# Patient Record
Sex: Female | Born: 1970 | Race: Black or African American | Hispanic: No | Marital: Single | Smoking: Never smoker
Health system: Southern US, Community
[De-identification: ages and names within clinical notes are randomized; demographics above are authoritative.]

## PROBLEM LIST (undated history)

## (undated) DIAGNOSIS — E119 Type 2 diabetes mellitus without complications: Secondary | ICD-10-CM

## (undated) DIAGNOSIS — Z8679 Personal history of other diseases of the circulatory system: Secondary | ICD-10-CM

## (undated) DIAGNOSIS — E785 Hyperlipidemia, unspecified: Secondary | ICD-10-CM

## (undated) DIAGNOSIS — I499 Cardiac arrhythmia, unspecified: Secondary | ICD-10-CM

## (undated) DIAGNOSIS — G809 Cerebral palsy, unspecified: Secondary | ICD-10-CM

## (undated) DIAGNOSIS — E669 Obesity, unspecified: Secondary | ICD-10-CM

## (undated) DIAGNOSIS — F6381 Intermittent explosive disorder: Secondary | ICD-10-CM

## (undated) DIAGNOSIS — F79 Unspecified intellectual disabilities: Secondary | ICD-10-CM

## (undated) DIAGNOSIS — G40919 Epilepsy, unspecified, intractable, without status epilepticus: Secondary | ICD-10-CM

## (undated) DIAGNOSIS — I10 Essential (primary) hypertension: Secondary | ICD-10-CM

## (undated) HISTORY — DX: Obesity, unspecified: E66.9

## (undated) HISTORY — DX: Type 2 diabetes mellitus without complications: E11.9

## (undated) HISTORY — DX: Essential (primary) hypertension: I10

## (undated) HISTORY — DX: Personal history of other diseases of the circulatory system: Z86.79

## (undated) HISTORY — DX: Hyperlipidemia, unspecified: E78.5

## (undated) HISTORY — DX: Unspecified intellectual disabilities: F79

## (undated) HISTORY — PX: OTHER SURGICAL HISTORY: SHX169

## (undated) HISTORY — DX: Epilepsy, unspecified, intractable, without status epilepticus: G40.919

---

## 2013-06-22 ENCOUNTER — Ambulatory Visit: Payer: Self-pay | Admitting: Nurse Practitioner

## 2013-07-26 ENCOUNTER — Encounter: Payer: Self-pay | Admitting: Nurse Practitioner

## 2013-07-26 DIAGNOSIS — G40802 Other epilepsy, not intractable, without status epilepticus: Secondary | ICD-10-CM

## 2013-07-26 DIAGNOSIS — F79 Unspecified intellectual disabilities: Secondary | ICD-10-CM | POA: Insufficient documentation

## 2013-07-28 ENCOUNTER — Ambulatory Visit: Payer: Self-pay | Admitting: Nurse Practitioner

## 2013-08-30 ENCOUNTER — Encounter: Payer: Self-pay | Admitting: Podiatry

## 2013-08-30 ENCOUNTER — Ambulatory Visit (INDEPENDENT_AMBULATORY_CARE_PROVIDER_SITE_OTHER): Payer: Medicare Other | Admitting: Podiatry

## 2013-08-30 VITALS — BP 91/52 | HR 83 | Resp 12

## 2013-08-30 DIAGNOSIS — B351 Tinea unguium: Secondary | ICD-10-CM

## 2013-08-30 DIAGNOSIS — M79609 Pain in unspecified limb: Secondary | ICD-10-CM

## 2013-08-30 NOTE — Progress Notes (Signed)
Subjective:     Patient ID: Sandra Osborne, female   DOB: Aug 13, 1971, 42 y.o.   MRN: [id number]  HPI patient presents with caregiver stating nails are really bothering her and also her feet in general her with her toes and she is getting need new diabetic shoes   Review of Systems     Objective:   Physical Exam Diminished neurovascular status was noted with diminished muscle strength and DTR reflexes. Nail disease with thickness 1-5 both feet with diminished hair growth noted mild varicosities within the ankle region and corn callus formation     Assessment:     At wrist diabetic with structural deformities and nail disease with pain 1-5 bilateral    Plan:     Discussed diabetic shoes and today debridement nailbeds 1-5 both feet. We will get approval and have been made for her to prevent further problems

## 2013-09-23 ENCOUNTER — Ambulatory Visit (INDEPENDENT_AMBULATORY_CARE_PROVIDER_SITE_OTHER): Payer: Medicare Other | Admitting: Nurse Practitioner

## 2013-09-23 ENCOUNTER — Encounter: Payer: Self-pay | Admitting: Nurse Practitioner

## 2013-09-23 VITALS — BP 132/80 | HR 72 | Ht 62.5 in | Wt 188.0 lb

## 2013-09-23 DIAGNOSIS — F79 Unspecified intellectual disabilities: Secondary | ICD-10-CM

## 2013-09-23 DIAGNOSIS — G40802 Other epilepsy, not intractable, without status epilepticus: Secondary | ICD-10-CM

## 2013-09-23 NOTE — Progress Notes (Signed)
[facility]  PATIENT: Sandra Osborne DOB: [birth year]   REASON FOR VISIT: Followup for seizure disorder   HISTORY OF PRESENT ILLNESS: Sandra Osborne, 42 year old female returns for followup. She is accompanied today by her sister with whom she lives. She has had 2 seizures in 16 months. Her seizures consist of generalized jerking lasting less than 1 minute. She has good appetite and is sleeping well at night. Dr. Elesa Massed her psychiatrist orders her seizure medication. No medical issues have come up since last seen  HISTORY: of mental retardation, and seizures. The patient continues to have seizures with some regularity, and she has had 2 seizures in 2013. The patient's last seizure was one month ago. The patient will have generalized jerking, usually lasting less than 1 minute. The patient is on Keppra and Zonegran. More recently, the sister has noted that the patient will wheeze some in the evening hours. The patient has recently gained some weight. The patient does not wheeze at other times. No other new medical issues have come up since last seen.  REVIEW OF SYSTEMS: Full 14 system review of systems performed and notable only for those listed, all others are neg:  Constitutional: N/A  Cardiovascular: N/A  Ear/Nose/Throat: N/A  Skin: N/A  Eyes: N/A  Respiratory: N/A  Gastroitestinal: N/A  Hematology/Lymphatic: N/A  Endocrine: N/A Musculoskeletal: Walking difficulty Allergy/Immunology: N/A  Neurological: Seizure  Psychiatric: N/A   ALLERGIES: Allergies  Allergen Reactions  . Depakote [Divalproex Sodium]   . Primidone     HOME MEDICATIONS: Outpatient Prescriptions Prior to Visit  Medication Sig Dispense Refill  . calcium carbonate (OS-CAL) 600 MG TABS tablet Take 600 mg by mouth 2 (two) times daily with a meal.      . docusate sodium (COLACE) 100 MG capsule Take 100 mg by mouth daily.      . fexofenadine (ALLEGRA) 180 MG tablet Take 180 mg by mouth daily as  needed.      . levETIRAcetam (KEPPRA) 500 MG tablet Take 500 mg by mouth every 12 (twelve) hours. 2 tablets      . lovastatin (MEVACOR) 20 MG tablet Take 20 mg by mouth at bedtime.      . medroxyPROGESTERone (DEPO-PROVERA) 150 MG/ML injection Inject 150 mg into the muscle every 3 (three) months.      . metoprolol succinate (TOPROL-XL) 50 MG 24 hr tablet Take 50 mg by mouth daily. Take with or immediately following a meal.      . QUEtiapine (SEROQUEL) 200 MG tablet Take 200 mg by mouth 2 (two) times daily.      . quinapril (ACCUPRIL) 10 MG tablet Take 10 mg by mouth at bedtime.      Marland Kitchen zonisamide (ZONEGRAN) 100 MG capsule Take 100 mg by mouth 2 (two) times daily. 3 capsules       No facility-administered medications prior to visit.    PAST MEDICAL HISTORY: Past Medical History  Diagnosis Date  . Intractable seizures   . Mental retardation   . Dyslipidemia   . Diabetes   . Obesity   . Hypertension   . History of heart disease     PAST SURGICAL HISTORY: Past Surgical History  Procedure Laterality Date  . Dental extractions      FAMILY HISTORY: Family History  Problem Relation Age of Onset  . Lung cancer Mother   . Brain cancer Mother     SOCIAL HISTORY: History   Social History  . Marital Status: Single  Spouse Name: N/A    Number of Children: N/A  . Years of Education: N/A   Occupational History  . Not on file.   Social History Main Topics  . Smoking status: Never Smoker   . Smokeless tobacco: Never Used  . Alcohol Use: No  . Drug Use: No  . Sexual Activity: Not on file   Other Topics Concern  . Not on file   Social History Narrative   The patient is single and lives with her sister.   The patient goes to a day program.   Patient is left-handed.   Patient drinks two caffeine drinks daily.              PHYSICAL EXAM  Filed Vitals:   09/23/13 1018  BP: 132/80  Pulse: 72  Height: 5' 2.5" (1.588 m)  Weight: 188 lb (85.276 kg)   Body mass index  is 33.82 kg/(m^2).  Generalized: Well developed, moderately obese in no acute distress  Neurological examination   Mentation: Alert oriented to time, place, history taking. Does not follows all commands speech and language are limited Cranial nerve II-XII: .Pupils were equal round reactive to light extraocular movements were full, blinks to threat Facial sensation and strength were normal. hearing was intact to finger rubbing bilaterally. Uvula tongue midline. head turning and shoulder shrug were normal and symmetric.Tongue protrusion into cheek strength was normal. Motor: normal bulk and tone, full strength in the BUE, BLE,  No focal weakness Coordination:does not cooperate for testing Reflexes: Brachioradialis 2/2, biceps 2/2, triceps 2/2, patellar 2/2, Achilles 1/1, plantar responses were flexor bilaterally. Gait and Station: Rising up from seated position without assistance, wide based stance,  moderate stride, good arm swing, smooth turning, unable to perform tiptoe, and heel walking. Tandem gait not tested.   DIAGNOSTIC DATA (LABS, IMAGING, TESTING) - None to review  ASSESSMENT AND PLAN  42 y.o. year old female  has a past medical history of Intractable seizures; Mental retardation; here for followup. She has had 2 seizures in 16 months since last seen. She is currently on Keppra and Zonegran. She gets her refills from Dr. Elesa Massed.  Continue Zonegran and Keppra at current doses.  F/U yearly and prn Call for increase in seizure activity Nilda Riggs, [facility], [facility], APRN  [facility] [address] [place], [place] [address] [phone]

## 2013-09-23 NOTE — Progress Notes (Signed)
I have read the note, and I agree with the clinical assessment and plan.  WILLIS,CHARLES KEITH   

## 2013-09-23 NOTE — Patient Instructions (Signed)
Continue Zonegran and Keppra at current doses, these are renewed by Dr. Elesa Massed F/U yearly and prn

## 2013-10-10 ENCOUNTER — Telehealth: Payer: Self-pay | Admitting: *Deleted

## 2013-10-10 NOTE — Telephone Encounter (Signed)
CALLED AND L/M REGARDING DIABETIC SHOES. RECEIVED FAX FROM [facility] STATING:" DATE LAST SEEN BY PHYSICIAN NOT COMPLIANT". L/M STATING TO CALL BACK WITH ANY QUESTIONS SINCE DIABETIC SHOES ARE NOT BEING APPROVED.

## 2013-11-14 ENCOUNTER — Encounter: Payer: Self-pay | Admitting: *Deleted

## 2013-11-14 NOTE — Progress Notes (Signed)
SENT PT POST CARD LETTING HER KNOW DIABETIC SHOES ARE HERE. 

## 2013-11-22 ENCOUNTER — Ambulatory Visit: Payer: Medicare Other | Admitting: Podiatry

## 2013-11-29 ENCOUNTER — Encounter: Payer: Self-pay | Admitting: Podiatry

## 2013-11-29 ENCOUNTER — Ambulatory Visit (INDEPENDENT_AMBULATORY_CARE_PROVIDER_SITE_OTHER): Payer: Medicare Other | Admitting: Podiatry

## 2013-11-29 VITALS — BP 122/78 | HR 64 | Resp 12

## 2013-11-29 DIAGNOSIS — M79609 Pain in unspecified limb: Secondary | ICD-10-CM

## 2013-11-29 DIAGNOSIS — E1159 Type 2 diabetes mellitus with other circulatory complications: Secondary | ICD-10-CM

## 2013-11-29 DIAGNOSIS — M2010 Hallux valgus (acquired), unspecified foot: Secondary | ICD-10-CM

## 2013-11-29 DIAGNOSIS — B351 Tinea unguium: Secondary | ICD-10-CM

## 2013-11-29 NOTE — Progress Notes (Signed)
Subjective:     Patient ID: Sandra HandyPatricia A Osborne, female   DOB: 04/06/1971, 43 y.o.   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

## 2014-02-28 ENCOUNTER — Ambulatory Visit (INDEPENDENT_AMBULATORY_CARE_PROVIDER_SITE_OTHER): Payer: Medicare Other | Admitting: Podiatry

## 2014-02-28 VITALS — BP 110/72 | HR 74 | Resp 16

## 2014-02-28 DIAGNOSIS — M79609 Pain in unspecified limb: Secondary | ICD-10-CM

## 2014-02-28 DIAGNOSIS — B351 Tinea unguium: Secondary | ICD-10-CM

## 2014-03-01 NOTE — Progress Notes (Signed)
Subjective:     Patient ID: Sandra Osborne, female   DOB: 03-15-1971, 43 y.o.   MRN: [id number]  HPI patient presents with painful nailbeds that are thick yellow and brittle 1-5 both feet  she cannot cut   Review of Systems     Objective:   Physical Exam Neurovascular status intact no change in health history with thick nailbeds and are brittle and yellow 1-5 both feet and painful    Assessment:     Mycotic nail infection with pain 1-5 both feet    Plan:     Debridement painful nailbeds 1-5 both feet with no iatrogenic bleeding noted

## 2014-04-28 ENCOUNTER — Encounter: Payer: Self-pay | Admitting: Neurology

## 2014-05-30 ENCOUNTER — Ambulatory Visit: Payer: Medicare Other

## 2014-06-23 ENCOUNTER — Ambulatory Visit (INDEPENDENT_AMBULATORY_CARE_PROVIDER_SITE_OTHER): Payer: Medicare Other | Admitting: Podiatry

## 2014-06-23 DIAGNOSIS — B351 Tinea unguium: Secondary | ICD-10-CM

## 2014-06-23 DIAGNOSIS — M79676 Pain in unspecified toe(s): Secondary | ICD-10-CM

## 2014-06-23 DIAGNOSIS — M79609 Pain in unspecified limb: Secondary | ICD-10-CM

## 2014-06-23 NOTE — Progress Notes (Signed)
Subjective:     Patient ID: Sandra Osborne, female   DOB: 08-05-1971, 43 y.o.   MRN: [id number]  HPI patient presents with nails that are thick and painful 1-5 both feet that she cannot take care of   Review of Systems     Objective:   Physical Exam Neurovascular status intact with thick yellow brittle nailbeds 1-5 both feet    Assessment:     Mycotic nail infection with pain 1-5 both feet    Plan:     Debris painful nailbeds 1-5 both feet with no iatrogenic bleeding noted

## 2014-09-15 ENCOUNTER — Ambulatory Visit: Payer: Medicare Other | Admitting: Neurology

## 2014-09-28 ENCOUNTER — Telehealth: Payer: Self-pay | Admitting: Neurology

## 2014-09-28 ENCOUNTER — Ambulatory Visit: Payer: Medicare Other | Admitting: Neurology

## 2014-09-28 NOTE — Telephone Encounter (Signed)
This patient did not show for a revisit appointment today. 

## 2014-10-06 ENCOUNTER — Ambulatory Visit (INDEPENDENT_AMBULATORY_CARE_PROVIDER_SITE_OTHER): Payer: Medicare Other | Admitting: Podiatry

## 2014-10-06 DIAGNOSIS — M79676 Pain in unspecified toe(s): Secondary | ICD-10-CM | POA: Diagnosis not present

## 2014-10-06 DIAGNOSIS — B351 Tinea unguium: Secondary | ICD-10-CM

## 2014-10-06 NOTE — Progress Notes (Signed)
Subjective:     Patient ID: Sandra Osborne, female   DOB: 12/26/1970, 44 y.o.   MRN: [id number]  HPI patient presents with nails that are thick and painful 1-5 both feet that she cannot take care of   Review of Systems     Objective:   Physical Exam Neurovascular status intact with thick yellow brittle nailbeds 1-5 both feet    Assessment:     Mycotic nail infection with pain 1-5 both feet    Plan:     Debris painful nailbeds 1-5 both feet with no iatrogenic bleeding noted

## 2014-11-06 ENCOUNTER — Ambulatory Visit: Payer: Medicare Other | Admitting: Podiatry

## 2014-11-07 ENCOUNTER — Ambulatory Visit (INDEPENDENT_AMBULATORY_CARE_PROVIDER_SITE_OTHER): Payer: Medicare Other

## 2014-11-07 ENCOUNTER — Ambulatory Visit (INDEPENDENT_AMBULATORY_CARE_PROVIDER_SITE_OTHER): Payer: Medicare Other | Admitting: Podiatry

## 2014-11-07 ENCOUNTER — Encounter: Payer: Self-pay | Admitting: Podiatry

## 2014-11-07 VITALS — BP 94/82 | HR 76 | Resp 16

## 2014-11-07 DIAGNOSIS — M2011 Hallux valgus (acquired), right foot: Secondary | ICD-10-CM

## 2014-11-07 DIAGNOSIS — M779 Enthesopathy, unspecified: Secondary | ICD-10-CM | POA: Diagnosis not present

## 2014-11-07 NOTE — Progress Notes (Signed)
Subjective:     Patient ID: Sandra HandyPatricia A Monarrez, female   DOB: 12/15/1970, 44 y.o.   MRN: [id number]  HPI patient presents with caregiver who states that she traumatized her right foot and she's been complaining of it and that are not sure as to whether or not she broke something   Review of Systems     Objective:   Physical Exam Neurovascular status intact with no health history changes noted and large structural deformity first metatarsal right medial side with redness noted and pain when pressed    Assessment:     Probable trauma to the right first MPJ right with redness noted    Plan:     Precautionary x-ray taken at this time and advised on soaks therapy anti-inflammatories. Patient be seen back if symptoms persist and reviewed x-ray today

## 2014-12-09 ENCOUNTER — Emergency Department: Payer: Self-pay | Admitting: Student

## 2014-12-11 ENCOUNTER — Telehealth: Payer: Self-pay | Admitting: Neurology

## 2014-12-11 NOTE — Telephone Encounter (Signed)
[name] is calling as patient had a really bad seizure and she went to ER at [facility] and they stated she needs to see her Neurologist immediately.  Please call [name].

## 2014-12-11 NOTE — Telephone Encounter (Signed)
Patient was last seen 08-2013, and then was a no show for 08-2014.  According to last note patient was getting seizure meds from psychiatrist, Dr. Elesa MassedWard.

## 2014-12-11 NOTE — Telephone Encounter (Signed)
I called the family member. The patient recently had a bad seizure. She was changed to generic Zonegran in the summer of 2015, a recent change to generic with the Keppra as well. The patient will need a work in revisit sometime in the next several weeks.

## 2014-12-12 NOTE — Telephone Encounter (Signed)
Left message for Bari MantisRegina Oakley to return call and schedule patient for an appointment to see Darrol Angelarolyn Martin, in the next week or two, per Dr. Anne HahnWillis.

## 2014-12-13 NOTE — Telephone Encounter (Signed)
Patient is scheduled for 12-15-14 with Darrol Angelarolyn Martin, NP.

## 2014-12-15 ENCOUNTER — Ambulatory Visit (INDEPENDENT_AMBULATORY_CARE_PROVIDER_SITE_OTHER): Payer: Medicare Other | Admitting: Nurse Practitioner

## 2014-12-15 ENCOUNTER — Encounter: Payer: Self-pay | Admitting: Nurse Practitioner

## 2014-12-15 VITALS — Ht 64.0 in | Wt 190.2 lb

## 2014-12-15 DIAGNOSIS — F79 Unspecified intellectual disabilities: Secondary | ICD-10-CM | POA: Diagnosis not present

## 2014-12-15 DIAGNOSIS — R269 Unspecified abnormalities of gait and mobility: Secondary | ICD-10-CM

## 2014-12-15 DIAGNOSIS — G40309 Generalized idiopathic epilepsy and epileptic syndromes, not intractable, without status epilepticus: Secondary | ICD-10-CM | POA: Diagnosis not present

## 2014-12-15 MED ORDER — ZONISAMIDE 100 MG PO CAPS: 300.0000 mg | ORAL_CAPSULE | Freq: Two times a day (BID) | ORAL | Status: DC

## 2014-12-15 MED ORDER — LEVETIRACETAM 500 MG PO TABS: ORAL_TABLET | ORAL | Status: DC

## 2014-12-15 NOTE — Progress Notes (Signed)
[facility]  PATIENT: Sandra Osborne DOB: 1970-10-04   REASON FOR VISIT:follow up for seizure disorder, gait disorder, MR HISTORY FROM: sister    HISTORY OF PRESENT ILLNESS:Sandra Osborne, 44 year old female returns for followup. She is accompanied today by her sister with whom she lives. She was last seen in this office 09/23/2013 She had a seizure last Saturday, December 09, 2014  and was taken to the emergency room to [facility] where according to the sister she was given an extra dose of Keppra by mouth. Her medication was not changed . The sister knows of 2 seizures she has had in the last 6 months however she did not bring a record to the office and does not want to guess  Her seizures consist of generalized jerking lasting less than 1 to 2 minutes. On further questioning she has had a switch in her seizure medications to generic last summer and she does not always get them every 12 hours. She has good appetite and is sleeping well at night. 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 She goes to a day program [facility]. Her primary care physician is Dr. Letta Pate. She has a history of high blood pressure and mental retardation. She is uncooperative for most of the physical exam today. She needs medication refills for her seizure medications   HISTORY: of mental retardation, and seizures. The patient continues to have seizures with some regularity, and she has had 2 seizures in 2013. The patient's last seizure was one month ago. The patient will have generalized jerking, usually lasting less than 1 minute. The patient is on Keppra and Zonegran. More recently, the sister has noted that the patient will wheeze some in the evening hours. The patient has recently gained some weight. The patient does not wheeze at other times. No other new medical issues have come up since last seen.    REVIEW OF SYSTEMS: Full 14 system review of systems performed and notable only  for those listed, all others are neg:  Constitutional: neg  Cardiovascular: neg Ear/Nose/Throat: neg  Skin: neg Eyes: Allergies Respiratory: Wheezing  Gastroitestinal: neg  Hematology/Lymphatic: neg  Endocrine: neg Musculoskeletal: Gait abnormality  Allergy/Immunology: neg Neurological: Seizure disorder, mental retardation Psychiatric: Behavior disorder Sleep ALLERGIES: Allergies  Allergen Reactions  . Depakote [Divalproex Sodium]   . Primidone     HOME MEDICATIONS: Outpatient Prescriptions Prior to Visit  Medication Sig Dispense Refill  . docusate sodium (COLACE) 100 MG capsule Take 100 mg by mouth daily.    . fexofenadine (ALLEGRA) 180 MG tablet Take 180 mg by mouth daily as needed.    . lovastatin (MEVACOR) 20 MG tablet Take 20 mg by mouth at bedtime.    . medroxyPROGESTERone (DEPO-PROVERA) 150 MG/ML injection Inject 150 mg into the muscle every 3 (three) months.    . metoprolol (LOPRESSOR) 50 MG tablet     . metoprolol succinate (TOPROL-XL) 50 MG 24 hr tablet Take 50 mg by mouth daily. Take with or immediately following a meal.    . QUEtiapine (SEROQUEL) 200 MG tablet Take 200 mg by mouth 2 (two) times daily.    . quinapril (ACCUPRIL) 10 MG tablet Take 10 mg by mouth at bedtime.    . levETIRAcetam (KEPPRA) 500 MG tablet Take 500 mg by mouth every 12 (twelve) hours. 3 tablets daily    . zonisamide (ZONEGRAN) 100 MG capsule Take 300 mg by mouth 2 (two) times daily.     . calcium carbonate (OS-CAL)  600 MG TABS tablet Take 600 mg by mouth 2 (two) times daily with a meal.     No facility-administered medications prior to visit.    PAST MEDICAL HISTORY: Past Medical History  Diagnosis Date  . Intractable seizures   . Mental retardation   . Dyslipidemia   . Diabetes   . Obesity   . Hypertension   . History of heart disease     PAST SURGICAL HISTORY: Past Surgical History  Procedure Laterality Date  . Dental extractions      FAMILY HISTORY: Family History    Problem Relation Age of Onset  . Lung cancer Mother   . Brain cancer Mother     SOCIAL HISTORY: History   Social History  . Marital Status: Single    Spouse Name: N/A  . Number of Children: N/A  . Years of Education: N/A   Occupational History  . Not on file.   Social History Main Topics  . Smoking status: Never Smoker   . Smokeless tobacco: Never Used  . Alcohol Use: No  . Drug Use: No  . Sexual Activity: Not on file   Other Topics Concern  . Not on file   Social History Narrative   The patient is single and lives with her sister.   The patient goes to a day program.   Patient is left-handed.   Patient drinks two caffeine drinks daily.              PHYSICAL EXAM  Filed Vitals:   12/15/14 0815  Height: 5\' 4"  (1.626 m)  Weight: 190 lb 3.2 oz (86.274 kg)   Body mass index is 32.63 kg/(m^2).  Generalized: Well developed, moderately obese female in no acute distress  Head: normocephalic and atraumatic,. Oropharynx benign  Neck: Supple,   Musculoskeletal: No deformity   Neurological examination   Mentation: Alert ,MR , does not follow all commands, speech and language are limited.    Cranial nerve II-XII: Pupils were equal round reactive to light extraocular movements were full, blinks to threat  Facial sensation and strength were normal. hearing was intact to finger rubbing bilaterally. Uvula tongue midline. head turning and shoulder shrug were normal and symmetric.Tongue protrusion into cheek strength was normal. Motor: normal bulk and tone, full strength in the BUE, BLE, fine finger movements normal, no pronator drift. No focal weakness Sensory: Withdraws to pain Coordination: Does not cooperate for testing Reflexes: Brachioradialis 2/2, biceps 2/2, triceps 2/2, patellar 2/2, Achilles 1/1, plantar responses were flexor bilaterally. Gait and Station: Rising up from seated position without assistance, wide based stance,  moderate stride, good arm swing,  smooth turning, un able to perform tiptoe, and heel walking. Tandem gait not tested.  DIAGNOSTIC DATA (LABS, IMAGING, TESTING) - ASSESSMENT AND PLAN  44 y.o. year old female  has a past medical history of Intractable seizures; Mental retardation; gait abnormality and  behavior disorder here to follow-up. Recent seizure on March 12 with a trip to the ER. Given an extra dose of Keppra by mouth. No further seizure activity since that time Sister reports she does not always get her seizure medication on time   Will increase Keppra to 1000mg  in the morning and keep 500mg  at night Continue Zonegran 300mg  twice daily Keep a record of seizure activity and bring to visits FU yearly and prn next visit with Dr. Woody SellerWillis Lynton Crescenzo Carolyn Kazuto Sevey, [facility], [facility], APRN  [facility] [address], [address] [place], [place] [id number] [phone]

## 2014-12-15 NOTE — Patient Instructions (Addendum)
Will increase Keppra to 1000mg  in the morning and keep 500mg  at night Continue Zonegran 300mg  twice daily Keep a record of seizure activity and bring to visits FU yearly and prn next visit with Dr. Anne HahnWillis

## 2014-12-15 NOTE — Progress Notes (Signed)
I have read the note, and I agree with the clinical assessment and plan.  WILLIS,CHARLES KEITH   

## 2014-12-29 ENCOUNTER — Ambulatory Visit: Payer: Medicare Other | Admitting: Nurse Practitioner

## 2015-01-05 ENCOUNTER — Ambulatory Visit (INDEPENDENT_AMBULATORY_CARE_PROVIDER_SITE_OTHER): Payer: Medicare Other | Admitting: Podiatry

## 2015-01-05 ENCOUNTER — Ambulatory Visit: Payer: Medicare Other

## 2015-01-05 DIAGNOSIS — E1151 Type 2 diabetes mellitus with diabetic peripheral angiopathy without gangrene: Secondary | ICD-10-CM

## 2015-01-05 DIAGNOSIS — M79676 Pain in unspecified toe(s): Secondary | ICD-10-CM

## 2015-01-05 DIAGNOSIS — B351 Tinea unguium: Secondary | ICD-10-CM | POA: Diagnosis not present

## 2015-01-05 DIAGNOSIS — M2011 Hallux valgus (acquired), right foot: Secondary | ICD-10-CM

## 2015-01-05 NOTE — Progress Notes (Signed)
Subjective:     Patient ID: Sandra HandyPatricia A Harms, female   DOB: 08/29/1971, 44 y.o.   MRN: [id number]  HPI patient presents with nails that are thick and painful 1-5 both feet that she cannot take care of   Review of Systems     Objective:   Physical Exam Neurovascular status intact with thick yellow brittle nailbeds 1-5 both feet    Assessment:     Mycotic nail infection with pain 1-5 both feet    Plan:     Debris painful nailbeds 1-5 both feet with no iatrogenic bleeding noted

## 2015-01-07 NOTE — Progress Notes (Signed)
Subjective:     Patient ID: Sandra HandyPatricia A Galvao, female   DOB: 03/14/1971, 44 y.o.   MRN: [id number]  HPI patient presents with caregiver with severe nail disease 1-5 both feet structural imbalances of both feet and long-term diabetes   Review of Systems     Objective:   Physical Exam Neurovascular status diminished but intact with structural HAV deformity bilateral with redness and thick nailbeds 1-5 both feet that are painful brittle when pressed    Assessment:     Mycotic nail infections with pain 1-5 both feet and structural imbalances with at risk diabetic with diminished neuro vascular status    Plan:     Reviewed with caregiver condition and her diabetic shoes need to be redone as they are starting to wear out from last year. I went ahead today debrided nailbeds 1-5 both feet with no iatrogenic bleeding noted and we will get approval for diabetic shoes due to her at risk condition and structural imbalance

## 2015-02-13 ENCOUNTER — Ambulatory Visit: Payer: Medicare Other | Admitting: Neurology

## 2015-02-20 ENCOUNTER — Telehealth: Payer: Self-pay | Admitting: Neurology

## 2015-02-20 NOTE — Telephone Encounter (Signed)
I called back, got no answer.  Left message we were returning call.

## 2015-02-20 NOTE — Telephone Encounter (Signed)
Sandra LandAngela Ksor with [employer] called regarding directions on levETIRAcetam (KEPPRA) 500 MG tablet.  Please call and advise.  She can be reached at [phone].

## 2015-02-21 NOTE — Telephone Encounter (Signed)
Sandra Osborne with [employer] returned your call. She gave a different phone number. Please call @ [phone].

## 2015-02-21 NOTE — Telephone Encounter (Signed)
I called again.  Got no answer.  Left message.  

## 2015-03-08 ENCOUNTER — Ambulatory Visit: Payer: Medicare Other

## 2015-04-06 ENCOUNTER — Ambulatory Visit: Payer: Medicare Other

## 2015-04-10 ENCOUNTER — Ambulatory Visit (INDEPENDENT_AMBULATORY_CARE_PROVIDER_SITE_OTHER): Payer: Medicare Other | Admitting: Podiatry

## 2015-04-10 DIAGNOSIS — M79676 Pain in unspecified toe(s): Secondary | ICD-10-CM

## 2015-04-10 DIAGNOSIS — B351 Tinea unguium: Secondary | ICD-10-CM

## 2015-04-10 NOTE — Progress Notes (Signed)
Subjective: 44 y.o. female returns the office today for painful, elongated, thickened toenails which she is unable to trim herself. Denies any redness or drainage around the nails. Denies any acute changes since last appointment and no new complaints today. Denies any systemic complaints such as fevers, chills, nausea, vomiting.   Objective: Awake, alert, NAD; presents with a caregiver DP/PT pulses decreased, CRT less than 3 seconds Protective sensation decreased with SWMF. Nails hypertrophic, dystrophic, elongated, brittle, discolored 10. There is tenderness overlying the nails 1-5 bilaterally. There is no surrounding erythema or drainage along the nail sites. No open lesions or pre-ulcerative lesions are identified. HAV present.  No other areas of tenderness bilateral lower extremities. No overlying edema, erythema, increased warmth. No pain with calf compression, swelling, warmth, erythema.  Assessment: Patient presents with symptomatic onychomycosis  Plan: -Treatment options including alternatives, risks, complications were discussed -Nails sharply debrided 10 without complication/bleeding. -At today's appointment she was measured for diabetic shoes.  -Discussed daily foot inspection. If there are any changes, to call the office immediately.  -Follow-up in 3 months or sooner if any problems are to arise. In the meantime, encouraged to call the office with any questions, concerns, changes symptoms.   Ovid CurdMatthew Wagoner, DPM

## 2015-05-08 ENCOUNTER — Ambulatory Visit: Payer: Self-pay | Admitting: Podiatry

## 2015-05-17 ENCOUNTER — Ambulatory Visit (INDEPENDENT_AMBULATORY_CARE_PROVIDER_SITE_OTHER): Payer: Medicare Other | Admitting: Podiatry

## 2015-05-17 ENCOUNTER — Encounter: Payer: Self-pay | Admitting: Podiatry

## 2015-05-17 DIAGNOSIS — E1151 Type 2 diabetes mellitus with diabetic peripheral angiopathy without gangrene: Secondary | ICD-10-CM

## 2015-05-17 DIAGNOSIS — E1059 Type 1 diabetes mellitus with other circulatory complications: Secondary | ICD-10-CM | POA: Diagnosis not present

## 2015-05-17 DIAGNOSIS — M2012 Hallux valgus (acquired), left foot: Secondary | ICD-10-CM | POA: Diagnosis not present

## 2015-05-17 DIAGNOSIS — M2011 Hallux valgus (acquired), right foot: Secondary | ICD-10-CM

## 2015-05-17 DIAGNOSIS — E1342 Other specified diabetes mellitus with diabetic polyneuropathy: Secondary | ICD-10-CM

## 2015-05-17 NOTE — Progress Notes (Signed)
Patient ID: ONDINE GEMME, female   DOB: 11/15/1970, 44 y.o.   MRN: [id number]  Patient presents today to pick up diabetic shoes. Shoes were dispensed. She tried the shoes on and they appear to be fitting well without any high pressure areas. Oral and written break in instructions were given. If there are any problems at all with the shoes to call the office. She had no new complaints today. Follow-up as scheduled, or sooner should any problems arise. Encouraged to call the office with any questions/concerns/change in symptoms.

## 2015-05-17 NOTE — Patient Instructions (Signed)

## 2015-07-02 ENCOUNTER — Other Ambulatory Visit: Payer: Self-pay | Admitting: Family Medicine

## 2015-07-02 DIAGNOSIS — Z1231 Encounter for screening mammogram for malignant neoplasm of breast: Secondary | ICD-10-CM

## 2015-07-10 ENCOUNTER — Ambulatory Visit (INDEPENDENT_AMBULATORY_CARE_PROVIDER_SITE_OTHER): Payer: Medicare Other | Admitting: Sports Medicine

## 2015-07-10 ENCOUNTER — Ambulatory Visit: Payer: Medicare Other

## 2015-07-10 ENCOUNTER — Encounter: Payer: Self-pay | Admitting: Sports Medicine

## 2015-07-10 DIAGNOSIS — B351 Tinea unguium: Secondary | ICD-10-CM

## 2015-07-10 DIAGNOSIS — M79676 Pain in unspecified toe(s): Secondary | ICD-10-CM

## 2015-07-10 DIAGNOSIS — E1151 Type 2 diabetes mellitus with diabetic peripheral angiopathy without gangrene: Secondary | ICD-10-CM

## 2015-07-10 DIAGNOSIS — M2010 Hallux valgus (acquired), unspecified foot: Secondary | ICD-10-CM

## 2015-07-10 NOTE — Progress Notes (Signed)
Patient ID: RANETTE LUCKADOO, female   DOB: October 20, 1970, 44 y.o.   MRN: [id number] Subjective: LATONIA CONROW is a 44 y.o. female patient with history of type 2 diabetes who presents to office today with caregiver complaining of long, painful nails  while ambulating in shoes, unable to trim herself. Patient states that the glucose reading this morning was recorded by sister but value is unknown. Patient states that her new shoes fit good; caregiver denies any problems or concerns with her shoes. Patient denies any new changes in medication or new problems. Patient denies any new cramping, numbness, burning or tingling in the legs.  Patient Active Problem List   Diagnosis Date Noted  . Seizure disorder, 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  . Abnormality of gait 12/15/2014  . Mental retardation 07/26/2013  . 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 calcium carbonate (OS-CAL) 600 MG TABS tablet Take 600 mg by mouth 2 (two) times daily with a meal.    . cetirizine (ZYRTEC) 10 MG tablet Take 10 mg by mouth daily.    Marland Kitchen docusate sodium (COLACE) 100 MG capsule Take 100 mg by mouth daily.    . fexofenadine (ALLEGRA) 180 MG tablet Take 180 mg by mouth daily as needed.    . levETIRAcetam (KEPPRA) 500 MG tablet 3 tablets daily 2 in the am 1 in the pm 90 tablet 11  . lovastatin (MEVACOR) 20 MG tablet Take 20 mg by mouth at bedtime.    . medroxyPROGESTERone (DEPO-PROVERA) 150 MG/ML injection Inject 150 mg into the muscle every 3 (three) months.    . metoprolol (LOPRESSOR) 50 MG tablet     . metoprolol succinate (TOPROL-XL) 50 MG 24 hr tablet Take 50 mg by mouth daily. Take with or immediately following a meal.    . QUEtiapine (SEROQUEL) 200 MG tablet Take 200 mg by mouth 2 (two) times daily.    . quinapril (ACCUPRIL) 10 MG tablet Take 10 mg by mouth at bedtime.     Marland Kitchen zonisamide (ZONEGRAN) 100 MG capsule Take 3 capsules (300 mg total) by mouth 2 (two) times daily. 180 capsule 11   No current facility-administered medications on file prior to visit.   Allergies  Allergen Reactions  . Depakote [Divalproex Sodium]   . Primidone     Labs:HEMOGLOBIN A1C-  No recent lab on file  Objective: General: Patient is awake, alert, and oriented x 2 and in no acute distress. Steppage/equine gait.  Integument: Skin is warm, dry and supple bilateral. Nails are tender, long, thickened and  dystrophic with subungual debris, consistent with onychomycosis, 1-5 bilateral. No signs of infection. No open lesions or preulcerative lesions present bilateral. Remaining integument unremarkable.  Vasculature:  Dorsalis Pedis pulse 1/4 bilateral. Posterior Tibial pulse  0/4 bilateral.  Capillary fill time <3 sec 1-5 bilateral. Scant hair growth to the level of the digits. Temperature gradient within normal limits. No varicosities present bilateral. No edema present bilateral.   Neurology: Gross sensation present via light touch bilateral.   Musculoskeletal: Bilateral bunion right>left deformities noted bilateral. Muscular strength within normal limits in all lower extremity muscular groups bilateral without pain or limitation on range of motion . No tenderness with calf compression bilateral.  Assessment and Plan: Problem List Items Addressed This Visit    None    Visit Diagnoses    Dermatophytosis of nail    -  Primary    Pain of toe, unspecified laterality        HAV (hallux abducto valgus), unspecified laterality        Diabetic peripheral vascular disorder (HCC)          -Examined patient. -Discussed and educated patient on diabetic foot care, especially with  regards to the vascular, neurological and musculoskeletal systems.  -Stressed the importance of good glycemic control and the detriment of not  controlling glucose levels in relation to the  foot. -Mechanically debrided all nails 1-5 bilateral using sterile nail nipper and filed with dremel without incident  -Continue with diabetic shoes and plastizote inserts daily to accommodate foot type/HAV deformity  -Answered all patient questions -Patient to return as needed or in 3 months for diabetic foot care -Patient advised to call the office if any problems or questions arise in the  Meantime.  Asencion Islam, DPM

## 2015-07-12 ENCOUNTER — Ambulatory Visit: Payer: Self-pay | Attending: Family Medicine

## 2015-10-09 ENCOUNTER — Ambulatory Visit (INDEPENDENT_AMBULATORY_CARE_PROVIDER_SITE_OTHER): Payer: Medicare Other | Admitting: Sports Medicine

## 2015-10-09 ENCOUNTER — Encounter: Payer: Self-pay | Admitting: Sports Medicine

## 2015-10-09 DIAGNOSIS — E1151 Type 2 diabetes mellitus with diabetic peripheral angiopathy without gangrene: Secondary | ICD-10-CM | POA: Diagnosis not present

## 2015-10-09 DIAGNOSIS — B351 Tinea unguium: Secondary | ICD-10-CM | POA: Diagnosis not present

## 2015-10-09 DIAGNOSIS — M79676 Pain in unspecified toe(s): Secondary | ICD-10-CM

## 2015-10-09 DIAGNOSIS — M2010 Hallux valgus (acquired), unspecified foot: Secondary | ICD-10-CM | POA: Diagnosis not present

## 2015-10-09 NOTE — Progress Notes (Signed)
Patient ID: ADISA VIGEANT, female   DOB: Jan 31, 1971, 45 y.o.   MRN: [id number]  Subjective: SHIRAH ROSEMAN is a 45 y.o. female patient with history of type 2 diabetes who presents to office today with caregiver complaining of long, painful nails  while ambulating in shoes, unable to trim herself. Patient states that the glucose reading this morning is unknown but is "good". Patient keeps asking about new shoes during visit; Caregiver states that everytime she comes to office she thinks that she is suppose to get shoes. Current shoes fit well with no issues. Patient denies any new changes in medication or new problems. Patient denies any new cramping, numbness, burning or tingling in the legs.  Patient Active Problem List   Diagnosis Date Noted  . Seizure disorder, 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  . Abnormality of gait 12/15/2014  . Mental retardation 07/26/2013  . 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 calcium carbonate (OS-CAL) 600 MG TABS tablet Take 600 mg by mouth 2 (two) times daily with a meal.    . cetirizine (ZYRTEC) 10 MG tablet Take 10 mg by mouth daily.    Marland Kitchen docusate sodium (COLACE) 100 MG capsule Take 100 mg by mouth daily.    . fexofenadine (ALLEGRA) 180 MG tablet Take 180 mg by mouth daily as needed.    . levETIRAcetam (KEPPRA) 500 MG tablet 3 tablets daily 2 in the am 1 in the pm 90 tablet 11  . lovastatin (MEVACOR) 20 MG tablet Take 20 mg by mouth at bedtime.    . medroxyPROGESTERone (DEPO-PROVERA) 150 MG/ML injection Inject 150 mg into the muscle every 3 (three) months.    . metoprolol (LOPRESSOR) 50 MG tablet     . metoprolol succinate (TOPROL-XL) 50 MG 24 hr tablet Take 50 mg by mouth daily. Take with or immediately following a meal.    . QUEtiapine (SEROQUEL) 200 MG tablet Take 200 mg by mouth 2 (two) times daily.     . quinapril (ACCUPRIL) 10 MG tablet Take 10 mg by mouth at bedtime.    Marland Kitchen zonisamide (ZONEGRAN) 100 MG capsule Take 3 capsules (300 mg total) by mouth 2 (two) times daily. 180 capsule 11   No current facility-administered medications on file prior to visit.   Allergies  Allergen Reactions  . Depakote [Divalproex Sodium]   . Primidone     Labs:HEMOGLOBIN A1C-  No recent lab on file  Objective: General: Patient is awake, alert, and oriented x 2 and in no acute distress. Steppage/equine gait.  Integument: Skin is warm, dry and supple bilateral. Nails are tender, long, thickened and  dystrophic with subungual debris, consistent with onychomycosis, 1-5 bilateral. No signs of infection. No open lesions or preulcerative lesions present bilateral. Remaining integument unremarkable.  Vasculature:  Dorsalis Pedis pulse 1/4 bilateral. Posterior Tibial pulse  0/4 bilateral.  Capillary fill time <3 sec 1-5 bilateral. Scant hair growth to the level of the digits. Temperature gradient within normal limits. No varicosities present bilateral. No edema present bilateral.   Neurology: Gross sensation present via light touch bilateral.   Musculoskeletal:Asymptomatic Bilateral bunion right>left deformities noted bilateral. Muscular strength within normal limits in all lower extremity muscular groups bilateral without pain or limitation on range of motion . No tenderness with calf compression bilateral.  Assessment and Plan: Problem List Items Addressed This Visit    None  Visit Diagnoses    Dermatophytosis of nail    -  Primary    Pain of toe, unspecified laterality        HAV (hallux abducto valgus), unspecified laterality        Diabetic peripheral vascular disorder (HCC)          -Examined patient. -Discussed and educated patient on diabetic foot care, especially with  regards to the vascular, neurological and musculoskeletal systems.  -Stressed the importance of good glycemic control and  the detriment of not  controlling glucose levels in relation to the foot. -Mechanically debrided all nails 1-5 bilateral using sterile nail nipper and filed with dremel without incident  -Continue with diabetic shoes and plastizote inserts daily to accommodate foot type/HAV deformity;Informed patient that she will be elligible for another pair in later spring or summer.  -Answered all patient questions -Patient to return in 3 months for diabetic foot care -Patient's caregiver advised to call the office if any problems or questions arise in the meantime.  Asencion Islamitorya Shamarra Warda, DPM

## 2015-11-08 ENCOUNTER — Other Ambulatory Visit: Payer: Self-pay | Admitting: *Deleted

## 2015-11-08 MED ORDER — LEVETIRACETAM 500 MG PO TABS: ORAL_TABLET | ORAL | Status: DC

## 2015-12-17 ENCOUNTER — Ambulatory Visit: Payer: Medicare Other | Admitting: Neurology

## 2015-12-20 ENCOUNTER — Encounter: Payer: Self-pay | Admitting: Neurology

## 2016-01-01 ENCOUNTER — Encounter: Payer: Self-pay | Admitting: Nurse Practitioner

## 2016-01-01 ENCOUNTER — Ambulatory Visit (INDEPENDENT_AMBULATORY_CARE_PROVIDER_SITE_OTHER): Payer: Medicare Other | Admitting: Nurse Practitioner

## 2016-01-01 VITALS — BP 133/80 | HR 67 | Ht 64.0 in | Wt 185.6 lb

## 2016-01-01 DIAGNOSIS — R269 Unspecified abnormalities of gait and mobility: Secondary | ICD-10-CM

## 2016-01-01 DIAGNOSIS — G40309 Generalized idiopathic epilepsy and epileptic syndromes, not intractable, without status epilepticus: Secondary | ICD-10-CM | POA: Diagnosis not present

## 2016-01-01 DIAGNOSIS — F79 Unspecified intellectual disabilities: Secondary | ICD-10-CM | POA: Diagnosis not present

## 2016-01-01 MED ORDER — LEVETIRACETAM 500 MG PO TABS: ORAL_TABLET | ORAL | Status: DC

## 2016-01-01 NOTE — Progress Notes (Signed)
[facility]  PATIENT: Sandra Osborne DOB: 06/20/1971   REASON FOR VISIT: Follow-up for generalized seizure disorder, abnormality of gait, mental retardation HISTORY FROM: Sandra Osborne    HISTORY OF PRESENT ILLNESS:Ms. Sandra Osborne, 45 year old female returns for followup. She is accompanied today by her Sandra Sandra Osborne with whom she lives. She was last seen in this office 12/15/14.  The Sandra reports she continues to have about 2 seizures per month. Her Keppra was increased at her last visit . Her seizures consist of generalized jerking lasting less than 1 to 2 minutes. She did not call the office . She has good appetite and is sleeping well at night. 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 She goes to a day program [facility]. Her primary care physician is Sandra Osborne. She has a history of high blood pressure and mental retardation. She is uncooperative for most of the physical exam today. She needs medication refills for her seizure medications   HISTORY: of mental retardation, and seizures. The patient continues to have seizures with some regularity, and she has had 2 seizures in 2013. The patient's last seizure was one month ago. The patient will have generalized jerking, usually lasting less than 1 minute. The patient is on Keppra and Zonegran. More recently, the Sandra has noted that the patient will wheeze some in the evening hours. The patient has recently gained some weight. The patient does not wheeze at other times. No other new medical issues have come up since last seen.   REVIEW OF SYSTEMS: Full 14 system review of systems performed and notable only for those listed, all others are neg:  Constitutional: neg  Cardiovascular: neg Ear/Nose/Throat: neg  Skin: neg Eyes: Allergies Respiratory: Wheezing Gastroitestinal: neg  Hematology/Lymphatic: neg  Endocrine: neg Musculoskeletal:neg Allergy/Immunology:  neg Neurological: Seizures Psychiatric: neg Sleep : neg   ALLERGIES: Allergies  Allergen Reactions  . Depakote [Divalproex Sodium]   . Primidone     HOME MEDICATIONS: Outpatient Prescriptions Prior to Visit  Medication Sig Dispense Refill  . calcium carbonate (OS-CAL) 600 MG TABS tablet Take 600 mg by mouth 2 (two) times daily with a meal.    . cetirizine (ZYRTEC) 10 MG tablet Take 10 mg by mouth daily.    Marland Kitchen. docusate sodium (COLACE) 100 MG capsule Take 100 mg by mouth daily.    . fexofenadine (ALLEGRA) 180 MG tablet Take 180 mg by mouth daily as needed.    . levETIRAcetam (KEPPRA) 500 MG tablet 3 tablets daily 2 in the am 1 in the pm 90 tablet 1  . lovastatin (MEVACOR) 20 MG tablet Take 20 mg by mouth at bedtime.    . medroxyPROGESTERone (DEPO-PROVERA) 150 MG/ML injection Inject 150 mg into the muscle every 3 (three) months.    . metoprolol (LOPRESSOR) 50 MG tablet     . metoprolol succinate (TOPROL-XL) 50 MG 24 hr tablet Take 50 mg by mouth daily. Take with or immediately following a meal.    . QUEtiapine (SEROQUEL) 200 MG tablet Take 200 mg by mouth 2 (two) times daily.    . quinapril (ACCUPRIL) 10 MG tablet Take 10 mg by mouth at bedtime.    Marland Kitchen. zonisamide (ZONEGRAN) 100 MG capsule Take 3 capsules (300 mg total) by mouth 2 (two) times daily. 180 capsule 11   No facility-administered medications prior to visit.    PAST MEDICAL HISTORY: Past Medical History  Diagnosis Date  . Intractable seizures (HCC)   . Mental  retardation   . Dyslipidemia   . Diabetes (HCC)   . Obesity   . Hypertension   . History of heart disease     PAST SURGICAL HISTORY: Past Surgical History  Procedure Laterality Date  . Dental extractions      FAMILY HISTORY: Family History  Problem Relation Age of Onset  . Lung cancer Mother   . Brain cancer Mother     SOCIAL HISTORY: Social History   Social History  . Marital Status: Single    Spouse Name: N/A  . Number of Children: N/A  .  Years of Education: N/A   Occupational History  . Not on file.   Social History Main Topics  . Smoking status: Never Smoker   . Smokeless tobacco: Never Used  . Alcohol Use: No  . Drug Use: No  . Sexual Activity: Not on file   Other Topics Concern  . Not on file   Social History Narrative   The patient is single and lives with her Sandra.   The patient goes to a day program.   Patient is left-handed.   Patient drinks two caffeine drinks daily.              PHYSICAL EXAM  Filed Vitals:   01/01/16 1108  Height:  (1.626 m)  Weight: 185 lb 9.6 oz (84.188 kg)   Body mass index is 31.84 kg/(m^2). Generalized: Well developed, moderately obese female in no acute distress  Head: normocephalic and atraumatic,. Oropharynx benign  Neck: Supple,  Musculoskeletal: No deformity   Neurological examination   Mentation: Alert ,MR , does not follow all commands, speech and language are limited.   Cranial nerve II-XII: Pupils were equal round reactive to light extraocular movements were full, blinks to threat Facial sensation and strength were normal. hearing was intact to finger rubbing bilaterally. Uvula tongue midline. head turning and shoulder shrug were normal and symmetric.Tongue protrusion into cheek strength was normal. Motor: normal bulk and tone, full strength in the BUE, BLE, fine finger movements normal, no pronator drift. No focal weakness Sensory: Withdraws to pain Coordination: Does not cooperate for testing Reflexes: Brachioradialis 2/2, biceps 2/2, triceps 2/2, patellar 2/2, Achilles 1/1, plantar responses were flexor bilaterally. Gait and Station: Rising up from seated position without assistance, wide based stance, moderate stride, good arm swing, smooth turning, un able to perform tiptoe, and heel walking. Tandem gait not tested. DIAGNOSTIC DATA (LABS, IMAGING, TESTING) - ASSESSMENT AND PLAN 45 y.o. year old female has a past medical history of  Intractable seizures; Mental retardation; gait abnormality and behavior disorder here to follow-up. She continues to have about 2 seizures per month according to her Sandra.  Increase Keppra to  in the morning and   at night Continue Zonegran  twice daily Call for continued seizure activity FU yearly and prn next visit with Dr. Woody Seller, [facility], [facility], APRN  [facility] [address] [place], [place] [address] [phone]

## 2016-01-01 NOTE — Progress Notes (Signed)
I have read the note, and I agree with the clinical assessment and plan.  WILLIS,CHARLES KEITH   

## 2016-01-01 NOTE — Patient Instructions (Addendum)
Increase Keppra to 1000mg  in the morning and 1000mg   at night Continue Zonegran 300mg  twice daily FU yearly and prn next visit with Dr. Anne HahnWillis

## 2016-01-08 ENCOUNTER — Ambulatory Visit: Payer: Medicare Other | Admitting: Sports Medicine

## 2016-01-09 ENCOUNTER — Telehealth: Payer: Self-pay | Admitting: *Deleted

## 2016-01-09 MED ORDER — ZONISAMIDE 100 MG PO CAPS: 300.0000 mg | ORAL_CAPSULE | Freq: Two times a day (BID) | ORAL | Status: DC

## 2016-01-09 NOTE — Telephone Encounter (Signed)
Refill request for zonegran 100mg  caps (300mg  po bid) 180# with 11 refills.  [facility].

## 2016-02-06 ENCOUNTER — Telehealth: Payer: Self-pay | Admitting: Neurology

## 2016-02-06 NOTE — Telephone Encounter (Signed)
Sandra Osborne is calling for medical records for this patient for the past year be sent to fax #[phone].

## 2016-02-07 ENCOUNTER — Telehealth: Payer: Self-pay | Admitting: *Deleted

## 2016-02-07 NOTE — Telephone Encounter (Signed)
Received fax for request for non formulary drug.  I called pharmacy and pt/family caregiver requesting Brand name Keppra.  I filled out fax and to CM/NP for signature.

## 2016-02-08 NOTE — Telephone Encounter (Signed)
Fax confirmation received [phone].   [phone].

## 2016-02-12 NOTE — Telephone Encounter (Signed)
Received Approval for the nonformulary Keppra Brand Name.  Good thru 11-10-15 thr 02-07-17,  Silver script Medicare Prescription Drug coverage.    ID # [id number],  [phone].   Faxed approval letter to [facility] [phone] confirmed).

## 2016-02-12 NOTE — Addendum Note (Signed)
Addended byHermenia Fiscal: YOUNG, SANDRA on: 02/12/2016 03:27 PM   Modules accepted: Medications

## 2016-02-15 ENCOUNTER — Telehealth: Payer: Self-pay | Admitting: *Deleted

## 2016-02-15 NOTE — Telephone Encounter (Signed)
Caller [facility]    CID [id number]  Patient Sandra Osborne       Pt's Dr Daphine DeutscherMARTIN       Area Code 336 Phone# [phone] * DOB 08-Jun-2071    RE INQUIRING IF OFFICE RECEIVED A PRE AUTH.          REQUEST FOR THE PATIENTS MEDICATION                  Disp:Y/N N If Y = C/B If No Response In 20minutes ============================================================

## 2016-02-18 NOTE — Telephone Encounter (Signed)
Faxed silver script form for nonformulary zonegran.  (fax confirmation received).

## 2016-02-19 NOTE — Telephone Encounter (Signed)
I called to follow up on PA status.  Approval for Zonegran.  Case # [id number]  Good thru 02-18-2017  [facility].   Spoke to [place].

## 2016-02-29 ENCOUNTER — Ambulatory Visit (INDEPENDENT_AMBULATORY_CARE_PROVIDER_SITE_OTHER): Payer: Medicare Other | Admitting: Sports Medicine

## 2016-02-29 ENCOUNTER — Encounter: Payer: Self-pay | Admitting: Sports Medicine

## 2016-02-29 DIAGNOSIS — M79676 Pain in unspecified toe(s): Secondary | ICD-10-CM

## 2016-02-29 DIAGNOSIS — M2010 Hallux valgus (acquired), unspecified foot: Secondary | ICD-10-CM | POA: Diagnosis not present

## 2016-02-29 DIAGNOSIS — E1151 Type 2 diabetes mellitus with diabetic peripheral angiopathy without gangrene: Secondary | ICD-10-CM

## 2016-02-29 DIAGNOSIS — B351 Tinea unguium: Secondary | ICD-10-CM | POA: Diagnosis not present

## 2016-02-29 NOTE — Progress Notes (Signed)
Patient ID: Sandra Osborne, female   DOB: 08/14/1971, 45 y.o.   MRN: [id number]   Subjective: Sandra Osborne is a 45 y.o. female patient with history of type 2 diabetes who presents to office today with caregiver/relative complaining of long, painful nails  while ambulating in shoes, unable to trim herself. No other pedal concerns addressed.   Patient Active Problem List   Diagnosis Date Noted  . Seizure disorder, 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  . Abnormality of gait 12/15/2014  . Mental retardation 07/26/2013  . 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 calcium carbonate (OS-CAL) 600 MG TABS tablet Take 600 mg by mouth 2 (two) times daily with a meal. Reported on 01/01/2016    . cetirizine (ZYRTEC) 10 MG tablet Take 10 mg by mouth daily.    Marland Kitchen. docusate sodium (COLACE) 100 MG capsule Take 100 mg by mouth daily.    . fexofenadine (ALLEGRA) 180 MG tablet Take 180 mg by mouth daily as needed.    . levETIRAcetam (KEPPRA) 500 MG tablet 4 tablets daily 2 in the am , 2 in the pm 120 tablet 11  . lovastatin (MEVACOR) 20 MG tablet Take 20 mg by mouth at bedtime.    . medroxyPROGESTERone (DEPO-PROVERA) 150 MG/ML injection Inject 150 mg into the muscle every 3 (three) months.    . metoprolol (LOPRESSOR) 50 MG tablet     . metoprolol succinate (TOPROL-XL) 50 MG 24 hr tablet Take 50 mg by mouth daily. Take with or immediately following a meal.    . QUEtiapine (SEROQUEL) 200 MG tablet Take 200 mg by mouth 2 (two) times daily.    . quinapril (ACCUPRIL) 10 MG tablet Take 10 mg by mouth at bedtime.    Marland Kitchen. zonisamide (ZONEGRAN) 100 MG capsule Take 3 capsules (300 mg total) by mouth 2 (two) times daily. 180 capsule 11   No current facility-administered medications on file prior to visit.   Allergies  Allergen Reactions  . Depakote [Divalproex Sodium]   .  Primidone     Objective: General: Patient is awake, alert, and oriented x 2 and in no acute distress. Mental retardation. Steppage/equine gait.  Integument: Skin is warm, dry and supple bilateral. Nails are tender, long, thickened and  dystrophic with subungual debris, consistent with onychomycosis, 1-5 bilateral. No signs of infection. No open lesions or preulcerative lesions present bilateral. Remaining integument unremarkable.  Vasculature:  Dorsalis Pedis pulse 1/4 bilateral. Posterior Tibial pulse  0/4 bilateral.  Capillary fill time <3 sec 1-5 bilateral. Scant hair growth to the level of the digits. Temperature gradient within normal limits. No varicosities present bilateral. No edema present bilateral.   Neurology: Gross sensation present via light touch bilateral.   Musculoskeletal:Asymptomatic Bilateral bunion right>left deformities noted bilateral. Muscular strength within normal limits in all lower extremity muscular groups bilateral without pain or limitation on range of motion . No tenderness with calf compression bilateral.  Assessment and Plan: Problem List Items Addressed This Visit    None    Visit Diagnoses    Dermatophytosis of nail    -  Primary    Pain of toe, unspecified laterality        HAV (hallux abducto valgus), unspecified laterality        Diabetic peripheral vascular disorder (HCC)          -Examined patient. -Discussed and educated  patient on diabetic foot care, especially with  regards to the vascular, neurological and musculoskeletal systems.  -Stressed the importance of good glycemic control and the detriment of not  controlling glucose levels in relation to the foot. -Mechanically debrided all nails 1-5 bilateral using sterile nail nipper and filed with dremel without incident  -Continue with diabetic shoes and plastizote inserts daily to accommodate foot type/HAV deformity -Answered all patient questions -Patient to return in 3 months for  diabetic foot care -Patient's caregiver/relative advised to call the office if any problems or questions arise in the meantime.  Asencion Islam, DPM

## 2016-03-12 ENCOUNTER — Encounter: Payer: Self-pay | Admitting: Emergency Medicine

## 2016-03-12 ENCOUNTER — Emergency Department
Admission: EM | Admit: 2016-03-12 | Discharge: 2016-03-12 | Disposition: A | Discharge status: Home / Self Care | Payer: Medicare Other | Attending: Emergency Medicine | Admitting: Emergency Medicine

## 2016-03-12 DIAGNOSIS — Z7952 Long term (current) use of systemic steroids: Secondary | ICD-10-CM | POA: Diagnosis not present

## 2016-03-12 DIAGNOSIS — Z79899 Other long term (current) drug therapy: Secondary | ICD-10-CM | POA: Insufficient documentation

## 2016-03-12 DIAGNOSIS — I10 Essential (primary) hypertension: Secondary | ICD-10-CM | POA: Insufficient documentation

## 2016-03-12 DIAGNOSIS — Z8669 Personal history of other diseases of the nervous system and sense organs: Secondary | ICD-10-CM | POA: Insufficient documentation

## 2016-03-12 DIAGNOSIS — K648 Other hemorrhoids: Secondary | ICD-10-CM | POA: Diagnosis not present

## 2016-03-12 DIAGNOSIS — E119 Type 2 diabetes mellitus without complications: Secondary | ICD-10-CM | POA: Insufficient documentation

## 2016-03-12 DIAGNOSIS — K625 Hemorrhage of anus and rectum: Secondary | ICD-10-CM | POA: Diagnosis present

## 2016-03-12 LAB — GLUCOSE, CAPILLARY: GLUCOSE-CAPILLARY: 153 mg/dL — AB (ref 65–99)

## 2016-03-12 LAB — COMPREHENSIVE METABOLIC PANEL
ALBUMIN: 3.4 g/dL — AB (ref 3.5–5.0)
ALT: 20 U/L (ref 14–54)
ANION GAP: 8 (ref 5–15)
AST: 21 U/L (ref 15–41)
Alkaline Phosphatase: 92 U/L (ref 38–126)
BILIRUBIN TOTAL: 0.3 mg/dL (ref 0.3–1.2)
BUN: 9 mg/dL (ref 6–20)
CHLORIDE: 114 mmol/L — AB (ref 101–111)
CO2: 17 mmol/L — ABNORMAL LOW (ref 22–32)
Calcium: 8.8 mg/dL — ABNORMAL LOW (ref 8.9–10.3)
Creatinine, Ser: 0.85 mg/dL (ref 0.44–1.00)
GFR calc Af Amer: >60 mL/min (ref >60)
Glucose, Bld: 158 mg/dL — ABNORMAL HIGH (ref 65–99)
POTASSIUM: 3.5 mmol/L (ref 3.5–5.1)
Sodium: 139 mmol/L (ref 135–145)
TOTAL PROTEIN: 7.4 g/dL (ref 6.5–8.1)

## 2016-03-12 LAB — CBC
HEMATOCRIT: 44 % (ref 35.0–47.0)
HEMOGLOBIN: 14.2 g/dL (ref 12.0–16.0)
MCH: 28.8 pg (ref 26.0–34.0)
MCHC: 32.3 g/dL (ref 32.0–36.0)
MCV: 89.1 fL (ref 80.0–100.0)
Platelets: 222 10*3/uL (ref 150–440)
RBC: 4.93 MIL/uL (ref 3.80–5.20)
RDW: 13.3 % (ref 11.5–14.5)
WBC: 5.6 10*3/uL (ref 3.6–11.0)

## 2016-03-12 LAB — TYPE AND SCREEN
ABO/RH(D): B POS
ANTIBODY SCREEN: NEGATIVE

## 2016-03-12 LAB — PROTIME-INR
INR: 1.3
PROTHROMBIN TIME: 16.3 s — AB (ref 11.4–15.0)

## 2016-03-12 MED ORDER — PRAMOXINE HCL 1 % RE FOAM: 1.0000 "application " | Freq: Two times a day (BID) | RECTAL | Status: AC

## 2016-03-12 MED ORDER — PHENYLEPHRINE IN HARD FAT 0.25 % RE SUPP: 1.0000 | Freq: Every day | RECTAL | Status: DC

## 2016-03-12 NOTE — ED Notes (Signed)
Pt presents to ED with reports of rectal bleeding x 1 today. Pt sister and caregiver states when pt had BM it looked she had passed a clot. Pt sister states blood filled the toilet. Pt sister states pt has a history of constipation.

## 2016-03-12 NOTE — Discharge Instructions (Signed)
Please eat a high-fiber diet, and continue your stool softener. As instructed by Dr. Excell Seltzer, please use the Proctofoam for 5 days twice daily, then use reparation H suppositories once daily for 5 more days. Call Dr. Earmon [place] office to make an appointment in 10 days.  Return to the emergency department for increased bleeding, lightheadedness, fainting, shortness of breath, fever, or any other symptoms concerning to you.  Hemorrhoids Hemorrhoids are swollen veins around the rectum or anus. There are two types of hemorrhoids:   Internal hemorrhoids. These occur in the veins just inside the rectum. They may poke through to the outside and become irritated and painful.  External hemorrhoids. These occur in the veins outside the anus and can be felt as a painful swelling or hard lump near the anus. CAUSES  Pregnancy.   Obesity.   Constipation or diarrhea.   Straining to have a bowel movement.   Sitting for long periods on the toilet.  Heavy lifting or other activity that caused you to strain.  Anal intercourse. SYMPTOMS   Pain.   Anal itching or irritation.   Rectal bleeding.   Fecal leakage.   Anal swelling.   One or more lumps around the anus.  DIAGNOSIS  Your caregiver may be able to diagnose hemorrhoids by visual examination. Other examinations or tests that may be performed include:   Examination of the rectal area with a gloved hand (digital rectal exam).   Examination of anal canal using a small tube (scope).   A blood test if you have lost a significant amount of blood.  A test to look inside the colon (sigmoidoscopy or colonoscopy). TREATMENT Most hemorrhoids can be treated at home. However, if symptoms do not seem to be getting better or if you have a lot of rectal bleeding, your caregiver may perform a procedure to help make the hemorrhoids get smaller or remove them completely. Possible treatments include:   Placing a rubber band at the base of the  hemorrhoid to cut off the circulation (rubber band ligation).   Injecting a chemical to shrink the hemorrhoid (sclerotherapy).   Using a tool to burn the hemorrhoid (infrared light therapy).   Surgically removing the hemorrhoid (hemorrhoidectomy).   Stapling the hemorrhoid to block blood flow to the tissue (hemorrhoid stapling).  HOME CARE INSTRUCTIONS   Eat foods with fiber, such as whole grains, beans, nuts, fruits, and vegetables. Ask your doctor about taking products with added fiber in them (fibersupplements).  Increase fluid intake. Drink enough water and fluids to keep your urine clear or pale yellow.   Exercise regularly.   Go to the bathroom when you have the urge to have a bowel movement. Do not wait.   Avoid straining to have bowel movements.   Keep the anal area dry and clean. Use wet toilet paper or moist towelettes after a bowel movement.   Medicated creams and suppositories may be used or applied as directed.   Only take over-the-counter or prescription medicines as directed by your caregiver.   Take warm sitz baths for 15-20 minutes, 3-4 times a day to ease pain and discomfort.   Place ice packs on the hemorrhoids if they are tender and swollen. Using ice packs between sitz baths may be helpful.   Put ice in a plastic bag.   Place a towel between your skin and the bag.   Leave the ice on for 15-20 minutes, 3-4 times a day.   Do not use a donut-shaped pillow or sit  on the toilet for long periods. This increases blood pooling and pain.  SEEK MEDICAL CARE IF:  You have increasing pain and swelling that is not controlled by treatment or medicine.  You have uncontrolled bleeding.  You have difficulty or you are unable to have a bowel movement.  You have pain or inflammation outside the area of the hemorrhoids. MAKE SURE YOU:  Understand these instructions.  Will watch your condition.  Will get help right away if you are not doing well  or get worse.   This information is not intended to replace advice given to you by your health care provider. Make sure you discuss any questions you have with your health care provider.   Document Released: 09/12/2000 Document Revised: 09/01/2012 Document Reviewed: 07/20/2012 Elsevier Interactive Patient Education 2016 Elsevier Inc.  High-Fiber Diet Fiber, also called dietary fiber, is a type of carbohydrate found in fruits, vegetables, whole grains, and beans. A high-fiber diet can have many health benefits. Your health care provider may recommend a high-fiber diet to help:  Prevent constipation. Fiber can make your bowel movements more regular.  Lower your cholesterol.  Relieve hemorrhoids, uncomplicated diverticulosis, or irritable bowel syndrome.  Prevent overeating as part of a weight-loss plan.  Prevent heart disease, type 2 diabetes, and certain cancers. WHAT IS MY PLAN? The recommended daily intake of fiber includes:  38 grams for men under age 46.  30 grams for men over age 46.  25 grams for women under age 350.  21 grams for women over age 46. You can get the recommended daily intake of dietary fiber by eating a variety of fruits, vegetables, grains, and beans. Your health care provider may also recommend a fiber supplement if it is not possible to get enough fiber through your diet. WHAT DO I NEED TO KNOW ABOUT A HIGH-FIBER DIET?  Fiber supplements have not been widely studied for their effectiveness, so it is better to get fiber through food sources.  Always check the fiber content on thenutrition facts label of any prepackaged food. Look for foods that contain at least 5 grams of fiber per serving.  Ask your dietitian if you have questions about specific foods that are related to your condition, especially if those foods are not listed in the following section.  Increase your daily fiber consumption gradually. Increasing your intake of dietary fiber too quickly  may cause bloating, cramping, or gas.  Drink plenty of water. Water helps you to digest fiber. WHAT FOODS CAN I EAT? Grains Whole-grain breads. Multigrain cereal. Oats and oatmeal. Brown rice. Barley. Bulgur wheat. Millet. Bran muffins. Popcorn. Rye wafer crackers. Vegetables Sweet potatoes. Spinach. Kale. Artichokes. Cabbage. Broccoli. Green peas. Carrots. Squash. Fruits Berries. Pears. Apples. Oranges. Avocados. Prunes and raisins. Dried figs. Meats and Other Protein Sources Navy, kidney, pinto, and soy beans. Split peas. Lentils. Nuts and seeds. Dairy Fiber-fortified yogurt. Beverages Fiber-fortified soy milk. Fiber-fortified orange juice. Other Fiber bars. The items listed above may not be a complete list of recommended foods or beverages. Contact your dietitian for more options. WHAT FOODS ARE NOT RECOMMENDED? Grains White bread. Pasta made with refined flour. White rice. Vegetables Fried potatoes. Canned vegetables. Well-cooked vegetables.  Fruits Fruit juice. Cooked, strained fruit. Meats and Other Protein Sources Fatty cuts of meat. Fried Environmental education officerpoultry or fried fish. Dairy Milk. Yogurt. Cream cheese. Sour cream. Beverages Soft drinks. Other Cakes and pastries. Butter and oils. The items listed above may not be a complete list of foods and beverages to  avoid. Contact your dietitian for more information. WHAT ARE SOME TIPS FOR INCLUDING HIGH-FIBER FOODS IN MY DIET?  Eat a wide variety of high-fiber foods.  Make sure that half of all grains consumed each day are whole grains.  Replace breads and cereals made from refined flour or white flour with whole-grain breads and cereals.  Replace white rice with brown rice, bulgur wheat, or millet.  Start the day with a breakfast that is high in fiber, such as a cereal that contains at least 5 grams of fiber per serving.  Use beans in place of meat in soups, salads, or pasta.  Eat high-fiber snacks, such as berries, raw  vegetables, nuts, or popcorn.   This information is not intended to replace advice given to you by your health care provider. Make sure you discuss any questions you have with your health care provider.   Document Released: 09/15/2005 Document Revised: 10/06/2014 Document Reviewed: 02/28/2014 Elsevier Interactive Patient Education [employer]

## 2016-03-12 NOTE — ED Provider Notes (Signed)
[facility] Emergency Department Provider Note  ____________________________________________  Time seen: Approximately 4:27 PM  I have reviewed the triage vital signs and the nursing notes.   HISTORY  Chief Complaint Rectal Bleeding  The patient's history is obtained from her family members, were her primary caregivers, as the patient is unable to give any history herself.  HPI Sandra Osborne is a 45 y.o. female with a history of mental retardation, chronic constipation and stool softeners, presenting with rectal bleeding. The patient's sister reports that her sister goes to the bathroom by herself, but when she called to say that she was done, there was a large clot with some blood in the toilet water and that since then the patient has continued to bleed from her bottom. There is no recent history of lightheadedness or fainting, abdominal pain, nausea or vomiting, or fevers.   Past Medical History  Diagnosis Date  . Intractable seizures (HCC)   . Mental retardation   . Dyslipidemia   . Diabetes (HCC)   . Obesity   . Hypertension   . History of heart disease     Patient Active Problem List   Diagnosis Date Noted  . Internal hemorrhoid, bleeding   . Seizure disorder, 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  . Abnormality of gait 12/15/2014  . Mental retardation 07/26/2013  . Other forms of epilepsy and recurrent seizures without mention of intractable epilepsy 07/26/2013    Past Surgical History  Procedure Laterality Date  . Dental extractions      Current Outpatient Rx  Name  Route  Sig  Dispense  Refill  . calcium carbonate (OS-CAL) 600 MG TABS tablet   Oral   Take 600 mg by mouth 2 (two) times daily with a meal. Reported on 01/01/2016         . cetirizine (ZYRTEC) 10 MG tablet   Oral   Take 10 mg by mouth daily.         Marland Kitchen docusate sodium (COLACE) 100 MG capsule   Oral   Take 100 mg by mouth daily.         . fexofenadine (ALLEGRA)  180 MG tablet   Oral   Take 180 mg by mouth daily as needed.         . levETIRAcetam (KEPPRA) 500 MG tablet      4 tablets daily 2 in the am , 2 in the pm   120 tablet   11   . lovastatin (MEVACOR) 20 MG tablet   Oral   Take 20 mg by mouth at bedtime.         . medroxyPROGESTERone (DEPO-PROVERA) 150 MG/ML injection   Intramuscular   Inject 150 mg into the muscle every 3 (three) months.         . metoprolol (LOPRESSOR) 50 MG tablet               . metoprolol succinate (TOPROL-XL) 50 MG 24 hr tablet   Oral   Take 50 mg by mouth daily. Take with or immediately following a meal.         . phenylephrine (,USE FOR PREPARATION-H,) 0.25 % suppository   Rectal   Place 1 suppository rectally daily.   5 suppository   0   . pramoxine (PROCTOFOAM) 1 % foam   Rectal   Place 1 application rectally 2 (two) times daily. For five days   15 g   0   . QUEtiapine (SEROQUEL) 200 MG tablet  Oral   Take 200 mg by mouth 2 (two) times daily.         . quinapril (ACCUPRIL) 10 MG tablet   Oral   Take 10 mg by mouth at bedtime.         Marland Kitchen. zonisamide (ZONEGRAN) 100 MG capsule   Oral   Take 3 capsules (300 mg total) by mouth 2 (two) times daily.   180 capsule   11     Allergies Depakote and Primidone  Family History  Problem Relation Age of Onset  . Lung cancer Mother   . Brain cancer Mother     Social History Social History  Substance Use Topics  . Smoking status: Never Smoker   . Smokeless tobacco: Never Used  . Alcohol Use: No    Review of Systems Constitutional: No fever/chills.No lightheadedness or syncope. Eyes: Unable to obtain. ENT:  No congestion or rhinorrhea. Cardiovascular: Denies chest pain. Denies palpitations. Respiratory: Denies shortness of breath.  No cough. Gastrointestinal: No abdominal pain.  No nausea, no vomiting.  No diarrhea.  No constipation. Genitourinary: Negative for dysuria. Positive for bright red blood per  rectum. Musculoskeletal: Negative for back pain. Skin: Negative for rash. Neurological: Negative for headaches. No focal numbness, tingling or weakness.   10-point ROS otherwise negative.  ____________________________________________   PHYSICAL EXAM:  VITAL SIGNS: ED Triage Vitals  Enc Vitals Group     BP 03/12/16 1452 105/85 mmHg     Pulse Rate 03/12/16 1452 77     Resp 03/12/16 1452 18     Temp 03/12/16 1452 98.6 F (37 C)     Temp Source 03/12/16 1452 Oral     SpO2 03/12/16 1452 100 %     Weight 03/12/16 1452 185 lb (83.915 kg)     Height 03/12/16 1452 5\' 4"  (1.626 m)     Head Cir --      Peak Flow --      Pain Score --      Pain Loc --      Pain Edu? --      Excl. in GC? --     Constitutional: Alert and oriented. Well appearing and in no acute distress. Answers questions appropriately. Eyes: Conjunctivae are normal.  EOMI. No scleral icterus.No conjunctival pallor. Head: Atraumatic. Nose: No congestion/rhinnorhea. Mouth/Throat: Mucous membranes are moist.  Neck: No stridor.  Supple.   Cardiovascular: Normal rate, regular rhythm. No murmurs, rubs or gallops.  Respiratory: Normal respiratory effort.  No accessory muscle use or retractions. Lungs CTAB.  No wheezes, rales or ronchi. Gastrointestinal: Overweight. Soft, nontender and nondistended.  No guarding or rebound.  No peritoneal signs. Genitourinary: Rectal examination reveals no external hemorrhoids. The patient does have a large 2 x 3 cm internal palpable hemorrhoid at 9:00 which is actively oozing a mild amount of blood that is bright red. No pain with rectal examination. Musculoskeletal: No LE edema.  Neurologic:  A&Ox3.  Speech is clear.  Face and smile are symmetric.  EOMI.  Moves all extremities well. Skin:  Skin is warm, dry and intact. No rash noted. Psychiatric: Mood and affect are normal. Speech and behavior are normal.  Normal judgement.  ____________________________________________   LABS (all labs  ordered are listed, but only abnormal results are displayed)  Labs Reviewed  COMPREHENSIVE METABOLIC PANEL - Abnormal; Notable for the following:    Chloride 114 (*)    CO2 17 (*)    Glucose, Bld 158 (*)    Calcium 8.8 (*)  Albumin 3.4 (*)    All other components within normal limits  GLUCOSE, CAPILLARY - Abnormal; Notable for the following:    Glucose-Capillary 153 (*)    All other components within normal limits  PROTIME-INR - Abnormal; Notable for the following:    Prothrombin Time 16.3 (*)    All other components within normal limits  CBC  POC OCCULT BLOOD, ED  TYPE AND SCREEN   ____________________________________________  EKG  Not indicated ____________________________________________  RADIOLOGY  No results found.  ____________________________________________   PROCEDURES  Procedure(s) performed: None  Critical Care performed: No ____________________________________________   INITIAL IMPRESSION / ASSESSMENT AND PLAN / ED COURSE  Pertinent labs & imaging results that were available during my care of the patient were reviewed by me and considered in my medical decision making (see chart for details).  45 y.o. female who is not anticoagulated but does have a long history of constipation and stool softeners presenting with a bleeding internal hemorrhoid. The patient has stable vital signs, and hemoglobin of 14 with a hematocrit of 44. Given that she is actively still oozing, have talked with Dr. Everlene Farrier, the general surgeon on-call, who will come evaluate the patient.  ----------------------------------------- 8:20 PM on 03/12/2016 -----------------------------------------  The patient was seen and evaluated by Dr. Ernest Pine, who did agree with internal oozing hemorrhoid. The patient will be discharged home with treatment for hemorrhoids, and follow-up by Dr. Ernest Pine. Return precautions and follow-up instructions were  discussed.  ____________________________________________  FINAL CLINICAL IMPRESSION(S) / ED DIAGNOSES  Final diagnoses:  Internal hemorrhoid, bleeding      NEW MEDICATIONS STARTED DURING THIS VISIT:  Discharge Medication List as of 03/12/2016  7:18 PM    START taking these medications   Details  phenylephrine (,USE FOR PREPARATION-H,) 0.25 % suppository Place 1 suppository rectally daily., Starting 03/12/2016, Until Discontinued, Print    pramoxine (PROCTOFOAM) 1 % foam Place 1 application rectally 2 (two) times daily. For five days, Starting 03/12/2016, Until Sun 03/16/16, Print         Rockne Menghini, MD 03/12/16 2021

## 2016-03-12 NOTE — Consult Note (Signed)
Surgical Consultation  03/12/2016  Sandra Osborne is an 45 y.o. female.   CC: Rectal bleeding  HPI: This patient with a 1 day history of rectal bleeding noted by family members. Patient had a brain injury following meningitis at age 42 and is noncommunicative but multiple family members are present to give her history I'm not able to obtain a complete review of systems. She has never had an episode like this before and apparently is not dizzy there may been a question about some abdominal pain. He is freely constipated and takes DSS regularly I was asked see the patient by the emergency room physician for bleeding hemorrhoids  Past Medical History  Diagnosis Date  . Intractable seizures ([facility])   . Mental retardation   . Dyslipidemia   . Diabetes ([place])   . Obesity   . Hypertension   . History of heart disease     Past Surgical History  Procedure Laterality Date  . Dental extractions      Family History  Problem Relation Age of Onset  . Lung cancer Mother   . Brain cancer Mother     Social History:  reports that she has never smoked. She has never used smokeless tobacco. She reports that she does not drink alcohol or use illicit drugs.  Allergies:  Allergies  Allergen Reactions  . Depakote [Divalproex Sodium]   . Primidone     Medications reviewed.   Review of Systems:   Review of Systems  Unable to perform ROS: mental acuity     Physical Exam:  BP 100/89 mmHg  Pulse 61  Temp(Src) 98.2 F (36.8 C) (Oral)  Resp 16  Ht _0  (1.626 m)  Wt 185 lb (83.915 kg)  BMI 31.74 kg/m2  SpO2 100%  Physical Exam  Constitutional: She is well-developed, well-nourished, and in no distress. No distress.  Uncommunicative  HENT:  Head: Normocephalic and atraumatic.  Abdominal: Soft. She exhibits no distension. There is no tenderness. There is no rebound and no guarding.  Completely nontender abdominal exam  Genitourinary:  Rectal exam shows some clotted blood but no  active red bleeding with a palpable internal hemorrhoid that is quite large no external hemorrhoids and nontender  Musculoskeletal: Normal range of motion. She exhibits no edema.  Neurological: She is alert.  Skin: Skin is warm and dry. She is not diaphoretic. No erythema.  Vitals reviewed.     Results for orders placed or performed during the hospital encounter of 03/12/16 (from the past 48 hour(s))  Glucose, capillary     Status: Abnormal   Collection Time: 03/12/16  3:21 PM  Result Value Ref Range   Glucose-Capillary 153 (H) 65 - 99 mg/dL   Comment 1 Notify RN    Comment 2 Document in Chart   Comprehensive metabolic panel     Status: Abnormal   Collection Time: 03/12/16  3:26 PM  Result Value Ref Range   Sodium 139 135 - 145 mmol/L   Potassium 3.5 3.5 - 5.1 mmol/L   Chloride 114 (H) 101 - 111 mmol/L   CO2 17 (L) 22 - 32 mmol/L   Glucose, Bld 158 (H) 65 - 99 mg/dL   BUN 9 6 - 20 mg/dL   Creatinine, Ser 0.85 0.44 - 1.00 mg/dL   Calcium 8.8 (L) 8.9 - 10.3 mg/dL   Total Protein 7.4 6.5 - 8.1 g/dL   Albumin 3.4 (L) 3.5 - 5.0 g/dL   AST 21 15 - 41 U/L   ALT  20 14 - 54 U/L   Alkaline Phosphatase 92 38 - 126 U/L   Total Bilirubin 0.3 0.3 - 1.2 mg/dL   GFR calc non Af Amer >60 >60 mL/min   GFR calc Af Amer >60 >60 mL/min    Comment: (NOTE) The eGFR has been calculated using the CKD EPI equation. This calculation has not been validated in all clinical situations. eGFR's persistently <60 mL/min signify possible Chronic Kidney Disease.    Anion gap 8 5 - 15  CBC     Status: None   Collection Time: 03/12/16  3:26 PM  Result Value Ref Range   WBC 5.6 3.6 - 11.0 K/uL   RBC 4.93 3.80 - 5.20 MIL/uL   Hemoglobin 14.2 12.0 - 16.0 g/dL   HCT 44.0 35.0 - 47.0 %   MCV 89.1 80.0 - 100.0 fL   MCH 28.8 26.0 - 34.0 pg   MCHC 32.3 32.0 - 36.0 g/dL   RDW 13.3 11.5 - 14.5 %   Platelets 222 150 - 440 K/uL  Type and screen [facility]     Status: None (Preliminary  result)   Collection Time: 03/12/16  3:26 PM  Result Value Ref Range   ABO/RH(D) B POS    Antibody Screen NEG    Sample Expiration 03/15/2016   Protime-INR     Status: Abnormal   Collection Time: 03/12/16  3:26 PM  Result Value Ref Range   Prothrombin Time 16.3 (H) 11.4 - 15.0 seconds   INR 1.30    No results found.  Assessment/Plan:  This patient with brain injury following meningitis at age 48 who presents with multiple family members describing a single and first episode of rectal bleeding. She is recently constipated. Her hemoglobin currently is 14 and her heart rate is 60. With that in mind and the fact that they have never tried any sort of treatment in this first and only episode I recommend Proctofoam HC twice a day either foam or suppository for 5 days followed by Preparation H once a day and she can be seen in our office in 10 days this discussed with the emergency room physician and with family members. Portals placing the medication inside the rectal vault was reviewed as well  Florene Glen, MD, FACS

## 2016-03-12 NOTE — ED Notes (Signed)
Penny from Blood bank and Yenne from Lab were called at 15:35 and notified about my inability to print [employer] labels and made aware that the time, and my employee number was written on each tube label.

## 2016-03-25 ENCOUNTER — Other Ambulatory Visit: Payer: Self-pay

## 2016-03-26 ENCOUNTER — Encounter: Payer: Self-pay | Admitting: Surgery

## 2016-03-26 ENCOUNTER — Ambulatory Visit (INDEPENDENT_AMBULATORY_CARE_PROVIDER_SITE_OTHER): Payer: Medicare Other | Admitting: Surgery

## 2016-03-26 VITALS — BP 138/85 | HR 89 | Temp 98.5°F | Ht 64.0 in | Wt 178.0 lb

## 2016-03-26 DIAGNOSIS — K648 Other hemorrhoids: Secondary | ICD-10-CM

## 2016-03-26 MED ORDER — HYDROCORTISONE 2.5 % RE CREA: 1.0000 "application " | TOPICAL_CREAM | Freq: Two times a day (BID) | RECTAL | Status: DC

## 2016-03-26 NOTE — Patient Instructions (Addendum)

## 2016-03-26 NOTE — Progress Notes (Signed)
Subjective:     Patient ID: Sandra Osborne, female   DOB: 01/12/1971, 45 y.o.   MRN: [id number]  HPI  45 yr old female with history of mental retardation and having some bright red blood from rectum for a couple weeks.  Patient is accompanied by her sister who is her primary caregiver and answers most of the questions.  She does have pain there with a bowel movement but has been improving.  She has had hard stools for a while.  She does drink lots of water but still having some issues.  She is on some seizure medications and stools have been worse since then.  She still has some bleeding even after using the suppositories and preparation H.  They tried to get proctofoam but insurance would not cover it, so unable to get it.  She denies any fever, chills, dizziness, nausea, vomiting or dysuria.   Past Medical History  Diagnosis Date  . Intractable seizures (HCC)   . Mental retardation   . Dyslipidemia   . Diabetes (HCC)   . Obesity   . Hypertension   . History of heart disease    Past Surgical History  Procedure Laterality Date  . Dental extractions     Family History  Problem Relation Age of Onset  . Lung cancer Mother   . Brain cancer Mother    Social History   Social History  . Marital Status: Single    Spouse Name: N/A  . Number of Children: N/A  . Years of Education: N/A   Social History Main Topics  . Smoking status: Never Smoker   . Smokeless tobacco: Never Used  . Alcohol Use: No  . Drug Use: No  . Sexual Activity: Not Asked   Other Topics Concern  . None   Social History Narrative   The patient is single and lives with her sister.   The patient goes to a day program.   Patient is left-handed.   Patient drinks two caffeine drinks daily.             Current outpatient prescriptions:  .  calcium carbonate (OS-CAL) 600 MG TABS tablet, Take 600 mg by mouth 2 (two) times daily with a meal. Reported on 01/01/2016, Disp: , Rfl:  .  cetirizine (ZYRTEC) 10 MG tablet,  Take 10 mg by mouth daily., Disp: , Rfl:  .  docusate sodium (COLACE) 100 MG capsule, Take 100 mg by mouth daily., Disp: , Rfl:  .  fexofenadine (ALLEGRA) 180 MG tablet, Take 180 mg by mouth daily as needed., Disp: , Rfl:  .  levETIRAcetam (KEPPRA) 500 MG tablet, 4 tablets daily 2 in the am , 2 in the pm, Disp: 120 tablet, Rfl: 11 .  lovastatin (MEVACOR) 20 MG tablet, Take 20 mg by mouth at bedtime., Disp: , Rfl:  .  medroxyPROGESTERone (DEPO-PROVERA) 150 MG/ML injection, Inject 150 mg into the muscle every 3 (three) months., Disp: , Rfl:  .  MedroxyPROGESTERone Acetate 150 MG/ML SUSY, , Disp: , Rfl:  .  metoprolol (LOPRESSOR) 50 MG tablet, , Disp: , Rfl:  .  metoprolol succinate (TOPROL-XL) 50 MG 24 hr tablet, Take 50 mg by mouth daily. Take with or immediately following a meal., Disp: , Rfl:  .  phenylephrine (,USE FOR PREPARATION-H,) 0.25 % suppository, Place 1 suppository rectally daily., Disp: 5 suppository, Rfl: 0 .  QUEtiapine (SEROQUEL) 200 MG tablet, Take 200 mg by mouth 2 (two) times daily., Disp: , Rfl:  .  quinapril (ACCUPRIL) 10 MG tablet, Take 10 mg by mouth at bedtime., Disp: , Rfl:  .  zonisamide (ZONEGRAN) 100 MG capsule, Take 3 capsules (300 mg total) by mouth 2 (two) times daily., Disp: 180 capsule, Rfl: 11 .  hydrocortisone (ANUSOL-HC) 2.5 % rectal cream, Place 1 application rectally 2 (two) times daily. Please provide applicator., Disp: 30 g, Rfl: 0 Allergies  Allergen Reactions  . Depakote [Divalproex Sodium]   . Primidone      Review of Systems  Constitutional: Negative for fever, chills, activity change, fatigue and unexpected weight change.  HENT: Negative for congestion and sinus pressure.   Respiratory: Negative for cough, shortness of breath and wheezing.   Cardiovascular: Negative for chest pain, palpitations and leg swelling.  Gastrointestinal: Positive for constipation, blood in stool and anal bleeding. Negative for nausea, vomiting, abdominal pain, diarrhea  and abdominal distention.  Genitourinary: Negative for dysuria and hematuria.  Musculoskeletal: Negative for joint swelling and arthralgias.  Neurological: Positive for seizures. Negative for dizziness and weakness.  Hematological: Negative for adenopathy. Does not bruise/bleed easily.  Psychiatric/Behavioral: Negative for agitation. The patient is not nervous/anxious.   All other systems reviewed and are negative.      Filed Vitals:   03/26/16 1100  BP: 138/85  Pulse: 89  Temp: 98.5 F (36.9 C)    Objective:   Physical Exam  Constitutional: She is oriented to person, place, and time. She appears well-developed and well-nourished. No distress.  HENT:  Head: Normocephalic and atraumatic.  Right Ear: External ear normal.  Left Ear: External ear normal.  Nose: Nose normal.  Mouth/Throat: Oropharynx is clear and moist. No oropharyngeal exudate.  Eyes: Conjunctivae and EOM are normal. Pupils are equal, round, and reactive to light. No scleral icterus.  Neck: Normal range of motion. Neck supple. No tracheal deviation present.  Cardiovascular: Normal rate, regular rhythm, normal heart sounds and intact distal pulses.  Exam reveals no gallop and no friction rub.   No murmur heard. Pulmonary/Chest: Effort normal and breath sounds normal. No respiratory distress. She has no wheezes. She has no rales.  Abdominal: Soft. Bowel sounds are normal. She exhibits no distension. There is no tenderness. There is no rebound.  Genitourinary:  Good rectal tone, normal anal wink, no external lesions, right sided hemorrhoid palpable with minimal blood on exam  Musculoskeletal: Normal range of motion. She exhibits no edema or tenderness.  Neurological: She is alert and oriented to person, place, and time. No cranial nerve deficit.  Skin: Skin is warm and dry. No rash noted. No erythema. No pallor.  Psychiatric: She has a normal mood and affect. Her behavior is normal.  Vitals reviewed.      CBC  Latest Ref Rng 03/12/2016  WBC 3.6 - 11.0 K/uL 5.6  Hemoglobin 12.0 - 16.0 g/dL 16.114.2  Hematocrit 09.635.0 - 47.0 % 44.0  Platelets 150 - 440 K/uL 222    CMP Latest Ref Rng 03/12/2016  Glucose 65 - 99 mg/dL 045(W158(H)  BUN 6 - 20 mg/dL 9  Creatinine 0.980.44 - 1.191.00 mg/dL 1.470.85  Sodium 829135 - 562145 mmol/L 139  Potassium 3.5 - 5.1 mmol/L 3.5  Chloride 101 - 111 mmol/L 114(H)  CO2 22 - 32 mmol/L 17(L)  Calcium 8.9 - 10.3 mg/dL 1.3(Y8.8(L)  Total Protein 6.5 - 8.1 g/dL 7.4  Total Bilirubin 0.3 - 1.2 mg/dL 0.3  Alkaline Phos 38 - 126 U/L 92  AST 15 - 41 U/L 21  ALT 14 - 54 U/L 20  Assessment:     45 yr old with internal hemorrhoid    Plan:     I personally reviewed the patient's past medical history including past seizures and ED visit.  I personally reviewed laboratory values which were WNL. I recommended constipation treatment.  I discussed drinking plenty of water, increasing fiber in diet and starting miralax daily to have normal soft stools.  Also will give Anusol cream for area.  She may still have some blood from area for another week or so but should slowly get better.  I will have her f/u with Dr. Servando Snare of GI, for colonoscopy to ensure bleeding just coming from Hemoroid area.  I will have her f/u if still having bleeding or constipation issues after colonoscopy or if there is other pathology on colonoscopy.

## 2016-03-28 ENCOUNTER — Other Ambulatory Visit: Payer: Self-pay

## 2016-03-28 DIAGNOSIS — K625 Hemorrhage of anus and rectum: Secondary | ICD-10-CM

## 2016-03-28 MED ORDER — NA SULFATE-K SULFATE-MG SULF 17.5-3.13-1.6 GM/177ML PO SOLN: 1.0000 | ORAL | Status: DC

## 2016-05-06 ENCOUNTER — Ambulatory Visit: Admission: RE | Admit: 2016-05-06 | Payer: Medicaid Other | Source: Ambulatory Visit | Admitting: Gastroenterology

## 2016-05-06 ENCOUNTER — Encounter: Admission: RE | Payer: Self-pay | Source: Ambulatory Visit

## 2016-05-06 SURGERY — COLONOSCOPY WITH PROPOFOL
Anesthesia: General

## 2016-06-06 ENCOUNTER — Ambulatory Visit (INDEPENDENT_AMBULATORY_CARE_PROVIDER_SITE_OTHER): Payer: Medicare Other | Admitting: Podiatry

## 2016-06-06 ENCOUNTER — Encounter: Payer: Self-pay | Admitting: Podiatry

## 2016-06-06 ENCOUNTER — Ambulatory Visit: Payer: Medicare Other | Admitting: Sports Medicine

## 2016-06-06 DIAGNOSIS — M79676 Pain in unspecified toe(s): Secondary | ICD-10-CM | POA: Diagnosis not present

## 2016-06-06 DIAGNOSIS — E1151 Type 2 diabetes mellitus with diabetic peripheral angiopathy without gangrene: Secondary | ICD-10-CM

## 2016-06-06 DIAGNOSIS — B351 Tinea unguium: Secondary | ICD-10-CM | POA: Diagnosis not present

## 2016-06-06 DIAGNOSIS — M79609 Pain in unspecified limb: Secondary | ICD-10-CM

## 2016-06-06 NOTE — Progress Notes (Signed)
SUBJECTIVE Patient with a history of diabetes mellitus and mental disability presents to office today complaining of elongated, thickened nails. Pain while ambulating in shoes. Patient is unable to trim their own nails.   Allergies  Allergen Reactions  . Depakote [Divalproex Sodium]   . Primidone     OBJECTIVE General Patient is awake, alert, and oriented x 3 and in no acute distress. Derm Skin is dry and supple bilateral. Negative open lesions or macerations. Remaining integument unremarkable. Nails are tender, long, thickened and dystrophic with subungual debris, consistent with onychomycosis, 1-5 bilateral. No signs of infection noted. Vasc  DP and PT pedal pulses palpable bilaterally. Temperature gradient within normal limits.  Neuro Epicritic and protective threshold sensation diminished bilaterally.  Musculoskeletal Exam No symptomatic pedal deformities noted bilateral. Muscular strength within normal limits.  ASSESSMENT 1. Diabetes Mellitus w/ peripheral neuropathy 2. Onychomycosis of nail due to dermatophyte bilateral 3. Pain in foot bilateral  PLAN OF CARE Patient evaluated today. Instructed to maintain good pedal hygiene and foot care. Stressed importance of controlling blood sugar.  Mechanical debridement of nails 1-5 bilaterally performed using a nail nipper. Filed with dremel without incident.  Diabetic shoe form completed today for diabetic shoes with Plastizote inserts. All patient questions were answered. Return to clinic in 3 mos.    Felecia ShellingBrent M Jaelee Laughter, DPM

## 2016-06-06 NOTE — Patient Instructions (Signed)

## 2016-09-02 ENCOUNTER — Ambulatory Visit (INDEPENDENT_AMBULATORY_CARE_PROVIDER_SITE_OTHER): Payer: Medicare Other | Admitting: Podiatry

## 2016-09-02 DIAGNOSIS — M79609 Pain in unspecified limb: Secondary | ICD-10-CM

## 2016-09-02 DIAGNOSIS — L608 Other nail disorders: Secondary | ICD-10-CM

## 2016-09-02 DIAGNOSIS — B351 Tinea unguium: Secondary | ICD-10-CM | POA: Diagnosis not present

## 2016-09-02 DIAGNOSIS — E0843 Diabetes mellitus due to underlying condition with diabetic autonomic (poly)neuropathy: Secondary | ICD-10-CM

## 2016-09-02 DIAGNOSIS — L603 Nail dystrophy: Secondary | ICD-10-CM

## 2016-09-02 NOTE — Progress Notes (Signed)
SUBJECTIVE Patient with a history of diabetes mellitus presents to office today complaining of elongated, thickened nails. Pain while ambulating in shoes. Patient is unable to trim their own nails.   Allergies  Allergen Reactions  . Depakote [Divalproex Sodium]   . Primidone     OBJECTIVE General Patient is awake, alert, and oriented x 3 and in no acute distress. Derm Skin is dry and supple bilateral. Negative open lesions or macerations. Remaining integument unremarkable. Nails are tender, long, thickened and dystrophic with subungual debris, consistent with onychomycosis, 1-5 bilateral. No signs of infection noted. Vasc  DP and PT pedal pulses palpable bilaterally. Temperature gradient within normal limits.  Neuro Epicritic and protective threshold sensation diminished bilaterally.  Musculoskeletal Exam No symptomatic pedal deformities noted bilateral. Muscular strength within normal limits.  ASSESSMENT 1. Diabetes Mellitus w/ peripheral neuropathy 2. Onychomycosis of nail due to dermatophyte bilateral 3. Pain in foot bilateral  PLAN OF CARE 1. Patient evaluated today. 2. Instructed to maintain good pedal hygiene and foot care. Stressed importance of controlling blood sugar.  3. Mechanical debridement of nails 1-5 bilaterally performed using a nail nipper. Filed with dremel without incident.  4. Return to clinic in 3 mos.    Felecia ShellingBrent M Evans, DPM

## 2016-09-05 ENCOUNTER — Ambulatory Visit: Payer: Medicare Other | Admitting: Podiatry

## 2016-09-15 ENCOUNTER — Other Ambulatory Visit: Payer: Self-pay | Admitting: Obstetrics & Gynecology

## 2016-09-15 DIAGNOSIS — Z1231 Encounter for screening mammogram for malignant neoplasm of breast: Secondary | ICD-10-CM

## 2016-10-01 ENCOUNTER — Ambulatory Visit (INDEPENDENT_AMBULATORY_CARE_PROVIDER_SITE_OTHER): Payer: Self-pay | Admitting: *Deleted

## 2016-10-01 ENCOUNTER — Other Ambulatory Visit: Payer: Medicare Other | Admitting: *Deleted

## 2016-10-01 DIAGNOSIS — E0843 Diabetes mellitus due to underlying condition with diabetic autonomic (poly)neuropathy: Secondary | ICD-10-CM

## 2016-10-07 NOTE — Progress Notes (Signed)
Measured for diabetic shoes and insoles today. Will notify pt when they arrive.

## 2016-10-21 ENCOUNTER — Ambulatory Visit
Admission: RE | Admit: 2016-10-21 | Discharge: 2016-10-21 | Disposition: A | Discharge status: Home / Self Care | Payer: Medicare Other | Source: Ambulatory Visit | Attending: Obstetrics & Gynecology | Admitting: Obstetrics & Gynecology

## 2016-10-21 DIAGNOSIS — Z1231 Encounter for screening mammogram for malignant neoplasm of breast: Secondary | ICD-10-CM | POA: Diagnosis not present

## 2016-10-30 ENCOUNTER — Other Ambulatory Visit: Payer: Self-pay | Admitting: *Deleted

## 2016-10-30 ENCOUNTER — Inpatient Hospital Stay
Admission: RE | Admit: 2016-10-30 | Discharge: 2016-10-30 | Disposition: A | Discharge status: Home / Self Care | Payer: Self-pay | Source: Ambulatory Visit | Attending: *Deleted | Admitting: *Deleted

## 2016-10-30 DIAGNOSIS — Z1231 Encounter for screening mammogram for malignant neoplasm of breast: Secondary | ICD-10-CM

## 2016-11-05 ENCOUNTER — Other Ambulatory Visit: Payer: Self-pay | Admitting: Obstetrics & Gynecology

## 2016-11-05 DIAGNOSIS — R928 Other abnormal and inconclusive findings on diagnostic imaging of breast: Secondary | ICD-10-CM

## 2016-11-05 DIAGNOSIS — N6489 Other specified disorders of breast: Secondary | ICD-10-CM

## 2016-11-13 ENCOUNTER — Telehealth: Payer: Self-pay | Admitting: Podiatry

## 2016-11-13 NOTE — Telephone Encounter (Signed)
[place] patient please check status is arrived and call patients gaurdian Auth for shoes expires 12/09/16

## 2016-11-13 NOTE — Telephone Encounter (Signed)
We has received diabetic shoes in office.  Tammy will call and schedule patient appt with Betha on 12/03/16.

## 2016-11-13 NOTE — Telephone Encounter (Signed)
patients sister called checking to see if patients diabetic shoes are in yet.Please call her back and let her know what is going on

## 2016-11-14 ENCOUNTER — Other Ambulatory Visit: Payer: Self-pay

## 2016-11-14 ENCOUNTER — Ambulatory Visit: Payer: Medicare Other

## 2016-11-19 ENCOUNTER — Ambulatory Visit: Payer: Medicare Other

## 2016-11-19 ENCOUNTER — Other Ambulatory Visit: Payer: Self-pay

## 2016-11-26 ENCOUNTER — Encounter: Payer: Self-pay | Admitting: Obstetrics & Gynecology

## 2016-11-26 ENCOUNTER — Ambulatory Visit
Admission: RE | Admit: 2016-11-26 | Discharge: 2016-11-26 | Disposition: A | Discharge status: Home / Self Care | Payer: Medicare Other | Source: Ambulatory Visit | Attending: Obstetrics & Gynecology | Admitting: Obstetrics & Gynecology

## 2016-11-26 DIAGNOSIS — N6489 Other specified disorders of breast: Secondary | ICD-10-CM | POA: Diagnosis not present

## 2016-11-26 DIAGNOSIS — R928 Other abnormal and inconclusive findings on diagnostic imaging of breast: Secondary | ICD-10-CM

## 2016-11-27 ENCOUNTER — Encounter: Payer: Self-pay | Admitting: Obstetrics & Gynecology

## 2016-12-03 ENCOUNTER — Ambulatory Visit: Payer: Medicare Other | Admitting: *Deleted

## 2016-12-03 ENCOUNTER — Ambulatory Visit (INDEPENDENT_AMBULATORY_CARE_PROVIDER_SITE_OTHER): Payer: Medicare Other | Admitting: *Deleted

## 2016-12-03 DIAGNOSIS — M2010 Hallux valgus (acquired), unspecified foot: Secondary | ICD-10-CM

## 2016-12-03 DIAGNOSIS — E0843 Diabetes mellitus due to underlying condition with diabetic autonomic (poly)neuropathy: Secondary | ICD-10-CM | POA: Diagnosis not present

## 2016-12-03 DIAGNOSIS — M7750 Other enthesopathy of unspecified foot: Secondary | ICD-10-CM | POA: Diagnosis not present

## 2016-12-03 DIAGNOSIS — E1151 Type 2 diabetes mellitus with diabetic peripheral angiopathy without gangrene: Secondary | ICD-10-CM | POA: Diagnosis not present

## 2016-12-17 NOTE — Progress Notes (Signed)
Dispensed diabetic shoes and 3 pairs of insoles. Instructions were reviewed and a copy was given to the patient. Patient will reappointment for regularly scheduled diabetic foot care visits or if they experience any trouble with the diabetic shoes.  

## 2016-12-22 ENCOUNTER — Ambulatory Visit (INDEPENDENT_AMBULATORY_CARE_PROVIDER_SITE_OTHER): Payer: Medicare Other | Admitting: Podiatry

## 2016-12-22 DIAGNOSIS — E0843 Diabetes mellitus due to underlying condition with diabetic autonomic (poly)neuropathy: Secondary | ICD-10-CM

## 2016-12-22 DIAGNOSIS — B351 Tinea unguium: Secondary | ICD-10-CM | POA: Diagnosis not present

## 2016-12-22 DIAGNOSIS — M2011 Hallux valgus (acquired), right foot: Secondary | ICD-10-CM

## 2016-12-22 DIAGNOSIS — M79609 Pain in unspecified limb: Secondary | ICD-10-CM

## 2016-12-22 NOTE — Progress Notes (Signed)
Complaint:  Visit Type: Patient returns to my office for continued preventative foot care services. Complaint: Patient states" my nails have grown long and thick and become painful to walk and wear shoes" Patient has been diagnosed with DM with no foot complications. The patient presents for preventative foot care services. No changes to ROS  Podiatric Exam: Vascular: dorsalis pedis and posterior tibial pulses are weakly  palpable bilateral. Capillary return is immediate.  Skin turgor WNL  Sensorium: Normal Semmes Weinstein monofilament test. Normal tactile sensation bilaterally. Nail Exam: Pt has thick disfigured discolored nails with subungual debris noted bilateral entire nail hallux through fifth toenails Ulcer Exam: There is no evidence of ulcer or pre-ulcerative changes or infection. Orthopedic Exam: Muscle tone and strength are WNL. No limitations in general ROM. No crepitus or effusions noted. Foot type and digits show no abnormalities. Bony prominences are unremarkable. Skin: No Porokeratosis. No infection or ulcers  Diagnosis:  Onychomycosis, , Pain in right toe, pain in left toes  Treatment & Plan Procedures and Treatment: Consent by patient was obtained for treatment procedures. The patient understood the discussion of treatment and procedures well. All questions were answered thoroughly reviewed. Debridement of mycotic and hypertrophic toenails, 1 through 5 bilateral and clearing of subungual debris. No ulceration, no infection noted.  Return Visit-Office Procedure: Patient instructed to return to the office for a follow up visit 3 months for continued evaluation and treatment.    Helane GuntherGregory Tao Satz DPM

## 2016-12-30 ENCOUNTER — Encounter: Payer: Self-pay | Admitting: Neurology

## 2016-12-30 ENCOUNTER — Ambulatory Visit (INDEPENDENT_AMBULATORY_CARE_PROVIDER_SITE_OTHER): Payer: Medicare Other | Admitting: Neurology

## 2016-12-30 VITALS — BP 114/79 | HR 63 | Ht 64.0 in | Wt 164.5 lb

## 2016-12-30 DIAGNOSIS — G40309 Generalized idiopathic epilepsy and epileptic syndromes, not intractable, without status epilepticus: Secondary | ICD-10-CM

## 2016-12-30 DIAGNOSIS — F79 Unspecified intellectual disabilities: Secondary | ICD-10-CM

## 2016-12-30 DIAGNOSIS — R269 Unspecified abnormalities of gait and mobility: Secondary | ICD-10-CM | POA: Diagnosis not present

## 2016-12-30 MED ORDER — LEVETIRACETAM 1000 MG PO TABS: 1000.0000 mg | ORAL_TABLET | Freq: Two times a day (BID) | ORAL | 3 refills | Status: DC

## 2016-12-30 MED ORDER — ZONISAMIDE 100 MG PO CAPS: 300.0000 mg | ORAL_CAPSULE | Freq: Two times a day (BID) | ORAL | 3 refills | Status: DC

## 2016-12-30 NOTE — Progress Notes (Signed)
Reason for visit: Seizures  Sandra Osborne is an 46 y.o. female  History of present illness:  Sandra Osborne is a 46 year old black female with a history of mental retardation and seizures. The patient has a mild right hemiparesis. She is on Keppra taking 1000 mg twice daily and Zonegran 300 mg twice daily. She is still having about one seizure a month on average. Usually, the patient will have a seizure at night, sleeping around 2 or 3 in the morning associated with generalized jerking. The patient is being cared for by her family. She needs assistance with most activities of daily living. She is minimally verbal. She will be going for a colonoscopy in the near future through Dr. Mechele Collin at [facility].  Past Medical History:  Diagnosis Date  . Diabetes (HCC)   . Dyslipidemia   . History of heart disease   . Hypertension   . Intractable seizures (HCC)   . Mental retardation   . Obesity     Past Surgical History:  Procedure Laterality Date  . dental extractions      Family History  Problem Relation Age of Onset  . Lung cancer Mother   . Brain cancer Mother   . Breast cancer Neg Hx     Social history:  reports that she has never smoked. She has never used smokeless tobacco. She reports that she does not drink alcohol or use drugs.    Allergies  Allergen Reactions  . Depakote [Divalproex Sodium]   . Primidone     Medications:  Prior to Admission medications   Medication Sig Start Date End Date Taking? Authorizing Provider  calcium carbonate (OS-CAL) 600 MG TABS tablet Take 600 mg by mouth 2 (two) times daily with a meal. Reported on 01/01/2016   Yes Historical Provider, MD  cetirizine (ZYRTEC) 10 MG tablet Take 10 mg by mouth daily.   Yes Historical Provider, MD  docusate sodium (COLACE) 100 MG capsule Take 100 mg by mouth daily.   Yes Historical Provider, MD  fexofenadine (ALLEGRA) 180 MG tablet Take 180 mg by mouth daily as needed.   Yes Historical Provider, MD    levETIRAcetam (KEPPRA) 500 MG tablet 4 tablets daily 2 in the am , 2 in the pm 01/01/16  Yes Nilda Riggs, NP  lovastatin (MEVACOR) 20 MG tablet Take 20 mg by mouth at bedtime.   Yes Historical Provider, MD  medroxyPROGESTERone (DEPO-PROVERA) 150 MG/ML injection Inject 150 mg into the muscle every 3 (three) months.   Yes Historical Provider, MD  MedroxyPROGESTERone Acetate 150 MG/ML SUSY  01/01/16  Yes Historical Provider, MD  metoprolol (LOPRESSOR) 50 MG tablet  09/13/13  Yes Historical Provider, MD  QUEtiapine (SEROQUEL) 200 MG tablet Take 200 mg by mouth 2 (two) times daily.   Yes Historical Provider, MD  quinapril (ACCUPRIL) 10 MG tablet Take 10 mg by mouth at bedtime.   Yes Historical Provider, MD  zonisamide (ZONEGRAN) 100 MG capsule Take 3 capsules (300 mg total) by mouth 2 (two) times daily. 01/09/16  Yes Nilda Riggs, NP    ROS:  Out of a complete 14 system review of symptoms, the patient complains only of the following symptoms, and all other reviewed systems are negative.  Runny nose Eye itching, eye redness Wheezing Rectal bleeding Snoring Moles Seizure Agitation  Blood pressure 114/79, pulse 63, height  (1.626 m), weight 164 lb 8 oz (74.6 kg).  Physical Exam  General: The patient is alert and  cooperative at the time of the examination.  Skin: No significant peripheral edema is noted.   Neurologic Exam  Mental status: The patient is alert, but is minimally verbal, will not follow commands well for the examination.   Cranial nerves: Facial symmetry is present. Speech is normal, no aphasia or dysarthria is noted. Extraocular movements are full. Visual fields are full to threat.  Motor: The patient has good strength in the lower extremities, the patient holds the right arm in flexion, she appears to have normal strength with the left upper extremity.  Sensory examination: Pain sensation is symmetric on the face, arms, and legs, the patient has  grimace to stimulation on all 4 extremities.  Coordination: The patient will not cooperate well for cerebellar testing.  Gait and station: The patient has a slightly wide-based gait, the patient can walk independently  Reflexes: Deep tendon reflexes are symmetric.   Assessment/Plan:  1. Mental retardation  2. Seizures, intractable  The patient will continue the Keppra at the current dose, a prescription was given for this medication as well as for the Zonegran taking 300 mg twice daily. The patient will follow-up in one year, sooner if needed. There are no neurologic contraindications for the colonoscopy that is to be done soon. Getting an adequate prep on this patient may be quite difficult, however.  Marlan Palau MD 12/30/2016 11:28 AM  [facility] [address] [place], [place] [address]  Phone [phone] Fax [phone]

## 2016-12-31 ENCOUNTER — Other Ambulatory Visit: Payer: Self-pay | Admitting: Nurse Practitioner

## 2017-01-20 ENCOUNTER — Encounter: Payer: Self-pay | Admitting: *Deleted

## 2017-01-23 ENCOUNTER — Ambulatory Visit
Admission: RE | Admit: 2017-01-23 | Discharge: 2017-01-23 | Disposition: A | Discharge status: Home / Self Care | Payer: Medicare Other | Source: Ambulatory Visit | Attending: Unknown Physician Specialty | Admitting: Unknown Physician Specialty

## 2017-01-23 ENCOUNTER — Ambulatory Visit: Payer: Medicare Other | Admitting: Anesthesiology

## 2017-01-23 ENCOUNTER — Encounter
Admission: RE | Disposition: A | Discharge status: Home / Self Care | Payer: Self-pay | Source: Ambulatory Visit | Attending: Unknown Physician Specialty

## 2017-01-23 DIAGNOSIS — E785 Hyperlipidemia, unspecified: Secondary | ICD-10-CM | POA: Diagnosis not present

## 2017-01-23 DIAGNOSIS — K640 First degree hemorrhoids: Secondary | ICD-10-CM | POA: Diagnosis not present

## 2017-01-23 DIAGNOSIS — Z1211 Encounter for screening for malignant neoplasm of colon: Secondary | ICD-10-CM | POA: Insufficient documentation

## 2017-01-23 DIAGNOSIS — Z6827 Body mass index (BMI) 27.0-27.9, adult: Secondary | ICD-10-CM | POA: Diagnosis not present

## 2017-01-23 DIAGNOSIS — Z79899 Other long term (current) drug therapy: Secondary | ICD-10-CM | POA: Insufficient documentation

## 2017-01-23 DIAGNOSIS — E119 Type 2 diabetes mellitus without complications: Secondary | ICD-10-CM | POA: Diagnosis not present

## 2017-01-23 DIAGNOSIS — F79 Unspecified intellectual disabilities: Secondary | ICD-10-CM | POA: Diagnosis not present

## 2017-01-23 DIAGNOSIS — I10 Essential (primary) hypertension: Secondary | ICD-10-CM | POA: Insufficient documentation

## 2017-01-23 DIAGNOSIS — E669 Obesity, unspecified: Secondary | ICD-10-CM | POA: Insufficient documentation

## 2017-01-23 DIAGNOSIS — G809 Cerebral palsy, unspecified: Secondary | ICD-10-CM | POA: Diagnosis not present

## 2017-01-23 DIAGNOSIS — G40909 Epilepsy, unspecified, not intractable, without status epilepticus: Secondary | ICD-10-CM | POA: Diagnosis not present

## 2017-01-23 HISTORY — DX: Cardiac arrhythmia, unspecified: I49.9

## 2017-01-23 HISTORY — DX: Intermittent explosive disorder: F63.81

## 2017-01-23 HISTORY — PX: COLONOSCOPY WITH PROPOFOL: SHX5780

## 2017-01-23 HISTORY — DX: Cerebral palsy, unspecified: G80.9

## 2017-01-23 LAB — GLUCOSE, CAPILLARY: GLUCOSE-CAPILLARY: 62 mg/dL — AB (ref 65–99)

## 2017-01-23 SURGERY — COLONOSCOPY WITH PROPOFOL
Anesthesia: General

## 2017-01-23 MED ORDER — SODIUM CHLORIDE 0.9 % IV SOLN
INTRAVENOUS | Status: DC
  Administered 2017-01-23: 11:00:00 via INTRAVENOUS

## 2017-01-23 MED ORDER — MIDAZOLAM HCL 2 MG/2ML IJ SOLN
INTRAMUSCULAR | Status: AC
  Filled 2017-01-23: qty 2

## 2017-01-23 MED ORDER — PROPOFOL 500 MG/50ML IV EMUL
INTRAVENOUS | Status: AC
  Filled 2017-01-23: qty 50

## 2017-01-23 MED ORDER — LIDOCAINE HCL (PF) 2 % IJ SOLN
INTRAMUSCULAR | Status: AC
  Filled 2017-01-23: qty 2

## 2017-01-23 MED ORDER — PROPOFOL 500 MG/50ML IV EMUL
INTRAVENOUS | Status: DC | PRN
Start: 2017-01-23 — End: 2017-01-23
  Administered 2017-01-23: 110 ug/kg/min via INTRAVENOUS

## 2017-01-23 MED ORDER — SODIUM CHLORIDE 0.9 % IV SOLN: INTRAVENOUS | Status: DC

## 2017-01-23 MED ORDER — MIDAZOLAM HCL 2 MG/2ML IJ SOLN
INTRAMUSCULAR | Status: DC | PRN
  Administered 2017-01-23: 2 mg via INTRAVENOUS

## 2017-01-23 MED ORDER — PROPOFOL 10 MG/ML IV BOLUS
INTRAVENOUS | Status: AC
  Filled 2017-01-23: qty 20

## 2017-01-23 MED ORDER — PROPOFOL 10 MG/ML IV BOLUS
INTRAVENOUS | Status: DC | PRN
  Administered 2017-01-23: 60 mg via INTRAVENOUS

## 2017-01-23 NOTE — Anesthesia Preprocedure Evaluation (Signed)
Anesthesia Evaluation  Patient identified by MRN, date of birth, ID band Patient awake    Reviewed: Allergy & Precautions, H&P , NPO status , Patient's Chart, lab work & pertinent test results, reviewed documented beta blocker date and time   History of Anesthesia Complications Negative for: history of anesthetic complications  Airway Mallampati: II  TM Distance: >3 FB Neck ROM: full    Dental  (+) Edentulous Upper, Edentulous Lower   Pulmonary neg shortness of breath, asthma , neg sleep apnea, neg COPD, neg recent URI,           Cardiovascular Exercise Tolerance: Good hypertension, (-) angina(-) CAD, (-) Past MI, (-) Cardiac Stents and (-) CABG + dysrhythmias (-) Valvular Problems/Murmurs     Neuro/Psych Seizures -, Poorly Controlled,  PSYCHIATRIC DISORDERS (Mental retardation) Cerebral palsy    GI/Hepatic negative GI ROS, Neg liver ROS,   Endo/Other  diabetes, Type 2, Oral Hypoglycemic Agents  Renal/GU negative Renal ROS  negative genitourinary   Musculoskeletal   Abdominal   Peds  Hematology negative hematology ROS (+)   Anesthesia Other Findings Past Medical History: No date: Cerebral palsy (HCC) No date: Diabetes (HCC) No date: Dyslipidemia No date: Dysrhythmia     Comment: tachycardia No date: History of heart disease No date: Hypertension No date: Intermittent explosive disorder No date: Intractable seizures (HCC) No date: Mental retardation No date: Obesity   Reproductive/Obstetrics negative OB ROS                             Anesthesia Physical Anesthesia Plan  ASA: III  Anesthesia Plan: General   Post-op Pain Management:    Induction:   Airway Management Planned:   Additional Equipment:   Intra-op Plan:   Post-operative Plan:   Informed Consent: I have reviewed the patients History and Physical, chart, labs and discussed the procedure including the risks,  benefits and alternatives for the proposed anesthesia with the patient or authorized representative who has indicated his/her understanding and acceptance.   Dental Advisory Given  Plan Discussed with: Anesthesiologist, CRNA and Surgeon  Anesthesia Plan Comments:         Anesthesia Quick Evaluation

## 2017-01-23 NOTE — H&P (Signed)
Primary Care Physician:  Emogene Morgan, MD Primary Gastroenterologist:  Dr. Mechele Collin  Pre-Procedure History & Physical: HPI:  Sandra Osborne is a 46 y.o. female is here for an colonoscopy.   Past Medical History:  Diagnosis Date  . Cerebral palsy (HCC)   . Diabetes (HCC)   . Dyslipidemia   . Dysrhythmia    tachycardia  . History of heart disease   . Hypertension   . Intermittent explosive disorder   . Intractable seizures (HCC)   . Mental retardation   . Obesity     Past Surgical History:  Procedure Laterality Date  . dental extractions      Prior to Admission medications   Medication Sig Start Date End Date Taking? Authorizing Provider  calcium carbonate (OS-CAL) 600 MG TABS tablet Take 600 mg by mouth 2 (two) times daily with a meal. Reported on 01/01/2016    Historical Provider, MD  cetirizine (ZYRTEC) 10 MG tablet Take 10 mg by mouth daily.    Historical Provider, MD  docusate sodium (COLACE) 100 MG capsule Take 100 mg by mouth daily.    Historical Provider, MD  fexofenadine (ALLEGRA) 180 MG tablet Take 180 mg by mouth daily as needed.    Historical Provider, MD  levETIRAcetam (KEPPRA) 1000 MG tablet Take 1 tablet (1,000 mg total) by mouth 2 (two) times daily. 12/30/16   York Spaniel, MD  lovastatin (MEVACOR) 20 MG tablet Take 20 mg by mouth at bedtime.    Historical Provider, MD  medroxyPROGESTERone (DEPO-PROVERA) 150 MG/ML injection Inject 150 mg into the muscle every 3 (three) months.    Historical Provider, MD  metoprolol (LOPRESSOR) 50 MG tablet  09/13/13   Historical Provider, MD  QUEtiapine (SEROQUEL) 200 MG tablet Take 200 mg by mouth 2 (two) times daily.    Historical Provider, MD  quinapril (ACCUPRIL) 10 MG tablet Take 10 mg by mouth at bedtime.    Historical Provider, MD  zonisamide (ZONEGRAN) 100 MG capsule Take 3 capsules (300 mg total) by mouth 2 (two) times daily. 12/30/16   York Spaniel, MD    Allergies as of 12/05/2016 - Review Complete 09/02/2016   Allergen Reaction Noted  . Depakote [divalproex sodium]  07/26/2013  . Primidone  07/26/2013    Family History  Problem Relation Age of Onset  . Lung cancer Mother   . Brain cancer Mother   . Breast cancer Neg Hx     Social History   Social History  . Marital status: Single    Spouse name: N/A  . Number of children: N/A  . Years of education: N/A   Occupational History  . Not on file.   Social History Main Topics  . Smoking status: Never Smoker  . Smokeless tobacco: Never Used  . Alcohol use No  . Drug use: No  . Sexual activity: Not on file   Other Topics Concern  . Not on file   Social History Narrative   The patient is single and lives with her sister.   The patient goes to a day program.   Patient is left-handed.   Patient drinks two caffeine drinks daily.             Review of Systems: See HPI, otherwise negative ROS  Physical Exam: BP 112/89   Pulse (!) 101   Temp (!) 96.5 F (35.8 C) (Tympanic)   Resp 18   Ht  (1.626 m)   Wt 72.6 kg (160 lb)  SpO2 98%   BMI 27.46 kg/m  General:   Alert,  pleasant and cooperative in NAD Head:  Normocephalic and atraumatic. Neck:  Supple; no masses or thyromegaly. Lungs:  Clear throughout to auscultation.    Heart:  Regular rate and rhythm. Abdomen:  Soft, nontender and nondistended. Normal bowel sounds, without guarding, and without rebound.   Neurologic:  Alert and  oriented x4;  grossly normal neurologically.  Impression/Plan: Sandra Osborne is here for an colonoscopy to be performed for colon cancer screening  Risks, benefits, limitations, and alternatives regarding  colonoscopy have been reviewed with the patient.  Questions have been answered.  All parties agreeable.   Lynnae Prude, MD  01/23/2017, 11:55 AM

## 2017-01-23 NOTE — Anesthesia Post-op Follow-up Note (Cosign Needed)
Anesthesia QCDR form completed.        

## 2017-01-23 NOTE — Op Note (Signed)
[facility] Gastroenterology Patient Name: Sandra Osborne Procedure Date: 01/23/2017 12:03 PM MRN: [id number] Account #: [id number] Date of Birth: 01/27/1971 Admit Type: Outpatient Age: 46 Room: [facility] ENDO ROOM 1 Gender: Female Note Status: Finalized Procedure:            Colonoscopy Indications:          Screening for colorectal malignant neoplasm Providers:            Scot Jun, MD Referring MD:         Ngwe A. Aycock MD (Referring MD) Medicines:            Propofol per Anesthesia Complications:        No immediate complications. Procedure:            Pre-Anesthesia Assessment:                       - After reviewing the risks and benefits, the patient                        was deemed in satisfactory condition to undergo the                        procedure.                       After obtaining informed consent, the colonoscope was                        passed under direct vision. Throughout the procedure,                        the patient's blood pressure, pulse, and oxygen                        saturations were monitored continuously. The                        Colonoscope was introduced through the anus and                        advanced to the the cecum, identified by appendiceal                        orifice and ileocecal valve. The colonoscopy was                        performed without difficulty. The patient tolerated the                        procedure well. The quality of the bowel preparation                        was excellent. Findings:      Internal hemorrhoids were found during endoscopy. The hemorrhoids were       small and Grade I (internal hemorrhoids that do not prolapse).      The exam was otherwise without abnormality. Impression:           - Internal hemorrhoids.                       - The examination was  otherwise normal.                       - No specimens collected. Recommendation:       - Repeat colonoscopy in 10  years for screening purposes. Scot Jun, MD 01/23/2017 12:22:55 PM This report has been signed electronically. Number of Addenda: 0 Note Initiated On: 01/23/2017 12:03 PM Scope Withdrawal Time: 0 hours 5 minutes 58 seconds  Total Procedure Duration: 0 hours 10 minutes 2 seconds       [facility]

## 2017-01-23 NOTE — Transfer of Care (Signed)
Immediate Anesthesia Transfer of Care Note  Patient: Sandra Osborne  Procedure(s) Performed: Procedure(s): COLONOSCOPY WITH PROPOFOL (N/A)  Patient Location: PACU and Endoscopy Unit  Anesthesia Type:General  Level of Consciousness: awake, alert  and oriented  Airway & Oxygen Therapy: Patient Spontanous Breathing and Patient connected to nasal cannula oxygen  Post-op Assessment: Report given to RN and Post -op Vital signs reviewed and stable  Post vital signs: Reviewed and stable  Last Vitals:  Vitals:   01/23/17 1042 01/23/17 1224  BP:  97/63  Pulse:  99  Resp:  18  Temp: (!) 35.8 C 36.3 C    Last Pain:  Vitals:   01/23/17 1224  TempSrc: Tympanic         Complications: No apparent anesthesia complications

## 2017-01-26 ENCOUNTER — Encounter: Payer: Self-pay | Admitting: Unknown Physician Specialty

## 2017-01-26 NOTE — Anesthesia Postprocedure Evaluation (Signed)
Anesthesia Post Note  Patient: Sandra Osborne  Procedure(s) Performed: Procedure(s) (LRB): COLONOSCOPY WITH PROPOFOL (N/A)  Patient location during evaluation: Endoscopy Anesthesia Type: General Level of consciousness: awake and alert Pain management: pain level controlled Vital Signs Assessment: post-procedure vital signs reviewed and stable Respiratory status: spontaneous breathing, nonlabored ventilation, respiratory function stable and patient connected to nasal cannula oxygen Cardiovascular status: blood pressure returned to baseline and stable Postop Assessment: no signs of nausea or vomiting Anesthetic complications: no     Last Vitals:  Vitals:   01/23/17 1244 01/23/17 1254  BP: 104/65 106/89  Pulse: (!) 57 71  Resp: 20 20  Temp:      Last Pain:  Vitals:   01/23/17 1224  TempSrc: Tympanic                 Lenard Simmer

## 2017-02-26 ENCOUNTER — Ambulatory Visit: Payer: Medicare Other | Admitting: Occupational Therapy

## 2017-03-03 ENCOUNTER — Encounter: Payer: Medicare Other | Admitting: Occupational Therapy

## 2017-03-05 ENCOUNTER — Encounter: Payer: Medicare Other | Admitting: Occupational Therapy

## 2017-03-10 ENCOUNTER — Encounter: Payer: Self-pay | Admitting: Occupational Therapy

## 2017-03-12 ENCOUNTER — Encounter: Payer: Self-pay | Admitting: Occupational Therapy

## 2017-03-12 ENCOUNTER — Encounter: Payer: Self-pay | Admitting: Speech Pathology

## 2017-03-13 ENCOUNTER — Encounter: Payer: Self-pay | Admitting: Occupational Therapy

## 2017-03-17 ENCOUNTER — Encounter: Payer: Self-pay | Admitting: Occupational Therapy

## 2017-03-19 ENCOUNTER — Encounter: Payer: Self-pay | Admitting: Occupational Therapy

## 2017-03-24 ENCOUNTER — Encounter: Payer: Self-pay | Admitting: Occupational Therapy

## 2017-03-26 ENCOUNTER — Encounter: Payer: Self-pay | Admitting: Occupational Therapy

## 2017-03-31 ENCOUNTER — Telehealth: Payer: Self-pay | Admitting: *Deleted

## 2017-03-31 ENCOUNTER — Encounter: Payer: Self-pay | Admitting: Occupational Therapy

## 2017-03-31 NOTE — Telephone Encounter (Signed)
PA for brand name Keppra and Zonegran approved by [employer] [phone]) through 03/31/18.  Pt JW#[id number]. Keppra OZ#[id number].  Zonegran EX#[id number]. Pharmacy notified and will fill for patient.

## 2017-04-02 ENCOUNTER — Encounter: Payer: Self-pay | Admitting: Occupational Therapy

## 2017-04-06 ENCOUNTER — Ambulatory Visit (INDEPENDENT_AMBULATORY_CARE_PROVIDER_SITE_OTHER): Payer: Medicare Other | Admitting: Podiatry

## 2017-04-06 DIAGNOSIS — M2011 Hallux valgus (acquired), right foot: Secondary | ICD-10-CM

## 2017-04-06 DIAGNOSIS — E1151 Type 2 diabetes mellitus with diabetic peripheral angiopathy without gangrene: Secondary | ICD-10-CM

## 2017-04-06 DIAGNOSIS — B351 Tinea unguium: Secondary | ICD-10-CM

## 2017-04-06 DIAGNOSIS — M79609 Pain in unspecified limb: Secondary | ICD-10-CM

## 2017-04-06 NOTE — Progress Notes (Signed)
Complaint:  Visit Type: Patient returns to my office for continued preventative foot care services. Complaint: Patient states" my nails have grown long and thick and become painful to walk and wear shoes" Patient has been diagnosed with DM with no foot complications. The patient presents for preventative foot care services. No changes to ROS  Podiatric Exam: Vascular: dorsalis pedis and posterior tibial pulses are weakly  palpable bilateral. Capillary return is immediate.  Skin turgor WNL  Sensorium: Normal Semmes Weinstein monofilament test. Normal tactile sensation bilaterally. Nail Exam: Pt has thick disfigured discolored nails with subungual debris noted bilateral entire nail hallux through fifth toenails Ulcer Exam: There is no evidence of ulcer or pre-ulcerative changes or infection. Orthopedic Exam: Muscle tone and strength are WNL. No limitations in general ROM. No crepitus or effusions noted. Foot type and digits show no abnormalities. Bony prominences are unremarkable. Skin: No Porokeratosis. No infection or ulcers  Diagnosis:  Onychomycosis, , Pain in right toe, pain in left toes  Treatment & Plan Procedures and Treatment: Consent by patient was obtained for treatment procedures. The patient understood the discussion of treatment and procedures well. All questions were answered thoroughly reviewed. Debridement of mycotic and hypertrophic toenails, 1 through 5 bilateral and clearing of subungual debris. No ulceration, no infection noted.  Return Visit-Office Procedure: Patient instructed to return to the office for a follow up visit 3 months for continued evaluation and treatment.    Helane GuntherGregory Keiffer Piper DPM

## 2017-04-07 ENCOUNTER — Encounter: Payer: Self-pay | Admitting: Occupational Therapy

## 2017-04-09 ENCOUNTER — Encounter: Payer: Self-pay | Admitting: Occupational Therapy

## 2017-07-06 ENCOUNTER — Ambulatory Visit: Payer: Medicare Other | Admitting: Podiatry

## 2017-07-20 ENCOUNTER — Ambulatory Visit (INDEPENDENT_AMBULATORY_CARE_PROVIDER_SITE_OTHER): Payer: Medicare Other | Admitting: Podiatry

## 2017-07-20 DIAGNOSIS — E1151 Type 2 diabetes mellitus with diabetic peripheral angiopathy without gangrene: Secondary | ICD-10-CM

## 2017-07-20 DIAGNOSIS — M79609 Pain in unspecified limb: Secondary | ICD-10-CM

## 2017-07-20 DIAGNOSIS — B351 Tinea unguium: Secondary | ICD-10-CM

## 2017-07-20 DIAGNOSIS — M2011 Hallux valgus (acquired), right foot: Secondary | ICD-10-CM

## 2017-07-20 NOTE — Progress Notes (Signed)
Complaint:  Visit Type: Patient returns to my office for continued preventative foot care services. Complaint: Patient states" my nails have grown long and thick and become painful to walk and wear shoes" Patient has been diagnosed with DM with no foot complications. The patient presents for preventative foot care services. No changes to ROS  Podiatric Exam: Vascular: dorsalis pedis and posterior tibial pulses are weakly  palpable bilateral. Capillary return is immediate.  Skin turgor WNL  Sensorium: Normal Semmes Weinstein monofilament test. Normal tactile sensation bilaterally. Nail Exam: Pt has thick disfigured discolored nails with subungual debris noted bilateral entire nail hallux through fifth toenails Ulcer Exam: There is no evidence of ulcer or pre-ulcerative changes or infection. Orthopedic Exam: Muscle tone and strength are WNL. No limitations in general ROM. No crepitus or effusions noted. Foot type and digits show no abnormalities. Bony prominences are unremarkable. Skin: No Porokeratosis. No infection or ulcers  Diagnosis:  Onychomycosis, , Pain in right toe, pain in left toes  Treatment & Plan Procedures and Treatment: Consent by patient was obtained for treatment procedures. The patient understood the discussion of treatment and procedures well. All questions were answered thoroughly reviewed. Debridement of mycotic and hypertrophic toenails, 1 through 5 bilateral and clearing of subungual debris. No ulceration, no infection noted.  Return Visit-Office Procedure: Patient instructed to return to the office for a follow up visit 3 months for continued evaluation and treatment.    Helane GuntherGregory Elyssa Pendelton DPM

## 2017-10-22 ENCOUNTER — Encounter: Payer: Self-pay | Admitting: Podiatry

## 2017-10-22 ENCOUNTER — Ambulatory Visit (INDEPENDENT_AMBULATORY_CARE_PROVIDER_SITE_OTHER): Payer: Medicare Other | Admitting: Podiatry

## 2017-10-22 DIAGNOSIS — M79609 Pain in unspecified limb: Secondary | ICD-10-CM

## 2017-10-22 DIAGNOSIS — E1151 Type 2 diabetes mellitus with diabetic peripheral angiopathy without gangrene: Secondary | ICD-10-CM

## 2017-10-22 DIAGNOSIS — B351 Tinea unguium: Secondary | ICD-10-CM

## 2017-10-22 DIAGNOSIS — M2011 Hallux valgus (acquired), right foot: Secondary | ICD-10-CM

## 2017-10-22 NOTE — Progress Notes (Signed)
Complaint:  Visit Type: Patient returns to my office for continued preventative foot care services. Complaint: Patient states" my nails have grown long and thick and become painful to walk and wear shoes" Patient has been diagnosed with DM with no foot complications. The patient presents for preventative foot care services. No changes to ROS  Podiatric Exam: Vascular: dorsalis pedis and posterior tibial pulses are weakly  palpable bilateral. Capillary return is immediate.  Skin turgor WNL  Sensorium: Normal Semmes Weinstein monofilament test. Normal tactile sensation bilaterally. Nail Exam: Pt has thick disfigured discolored nails with subungual debris noted bilateral entire nail hallux through fifth toenails Ulcer Exam: There is no evidence of ulcer or pre-ulcerative changes or infection. Orthopedic Exam: Muscle tone and strength are WNL. No limitations in general ROM. No crepitus or effusions noted. Foot type and digits show no abnormalities. Bony prominences are unremarkable. Skin: No Porokeratosis. No infection or ulcers  Diagnosis:  Onychomycosis, , Pain in right toe, pain in left toes  Treatment & Plan Procedures and Treatment: Consent by patient was obtained for treatment procedures. The patient understood the discussion of treatment and procedures well. All questions were answered thoroughly reviewed. Debridement of mycotic and hypertrophic toenails, 1 through 5 bilateral and clearing of subungual debris. No ulceration, no infection noted.  Return Visit-Office Procedure: Patient instructed to return to the office for a follow up visit 3 months for continued evaluation and treatment.    Rilya Longo DPM 

## 2017-11-24 ENCOUNTER — Other Ambulatory Visit: Payer: Self-pay | Admitting: Neurology

## 2018-01-21 ENCOUNTER — Ambulatory Visit (INDEPENDENT_AMBULATORY_CARE_PROVIDER_SITE_OTHER): Payer: Medicare Other | Admitting: Podiatry

## 2018-01-21 ENCOUNTER — Encounter: Payer: Self-pay | Admitting: Podiatry

## 2018-01-21 DIAGNOSIS — M79676 Pain in unspecified toe(s): Secondary | ICD-10-CM

## 2018-01-21 DIAGNOSIS — B351 Tinea unguium: Secondary | ICD-10-CM

## 2018-01-21 DIAGNOSIS — M79609 Pain in unspecified limb: Principal | ICD-10-CM

## 2018-01-21 NOTE — Progress Notes (Signed)
Complaint:  Visit Type: Patient returns to my office for continued preventative foot care services. Complaint: Patient states" my nails have grown long and thick and become painful to walk and wear shoes" Patient has been diagnosed with DM with no foot complications. The patient presents for preventative foot care services. No changes to ROS  Podiatric Exam: Vascular: dorsalis pedis and posterior tibial pulses are weakly  palpable bilateral. Capillary return is immediate.  Skin turgor WNL  Sensorium: Normal Semmes Weinstein monofilament test. Normal tactile sensation bilaterally. Nail Exam: Pt has thick disfigured discolored nails with subungual debris noted bilateral entire nail hallux through fifth toenails Ulcer Exam: There is no evidence of ulcer or pre-ulcerative changes or infection. Orthopedic Exam: Muscle tone and strength are WNL. No limitations in general ROM. No crepitus or effusions noted. Foot type and digits show no abnormalities. Bony prominences are unremarkable. Skin: No Porokeratosis. No infection or ulcers  Diagnosis:  Onychomycosis, , Pain in right toe, pain in left toes  Treatment & Plan Procedures and Treatment: Consent by patient was obtained for treatment procedures. The patient understood the discussion of treatment and procedures well. All questions were answered thoroughly reviewed. Debridement of mycotic and hypertrophic toenails, 1 through 5 bilateral and clearing of subungual debris. No ulceration, no infection noted.  Return Visit-Office Procedure: Patient instructed to return to the office for a follow up visit 3 months for continued evaluation and treatment.    Ladina Shutters DPM 

## 2018-02-24 ENCOUNTER — Other Ambulatory Visit: Payer: Self-pay | Admitting: Neurology

## 2018-02-25 ENCOUNTER — Other Ambulatory Visit: Payer: Self-pay | Admitting: Neurology

## 2018-03-02 ENCOUNTER — Other Ambulatory Visit: Payer: Self-pay | Admitting: Neurology

## 2018-03-25 ENCOUNTER — Other Ambulatory Visit: Payer: Self-pay | Admitting: Neurology

## 2018-03-25 ENCOUNTER — Telehealth: Payer: Self-pay | Admitting: Neurology

## 2018-03-25 NOTE — Telephone Encounter (Signed)
Called and LVM for Sandra Osborne to call back. November is too far out. She has not been seen since 12/2016. She needs to be scheduled asap. We will fit her in for appt. Asked her to call back and speak with nurse to schedule appt.  

## 2018-03-25 NOTE — Telephone Encounter (Signed)
Called and LVM for Gena Clinkscale-sister (on DPR) to call.   Pt has not been seen in over a year and needs to make f/u. She needs refills on medications.  Please schedule if they call. Thank you

## 2018-03-25 NOTE — Telephone Encounter (Signed)
Called and LVM for Gena to call back. November is too far out. She has not been seen since 12/2016. She needs to be scheduled asap. We will fit her in for appt. Asked her to call back and speak with nurse to schedule appt.

## 2018-03-25 NOTE — Telephone Encounter (Signed)
Scheduled pt a yearly f/u for 11/26 at 2:30, would like her two medications sent in as requested

## 2018-03-26 ENCOUNTER — Other Ambulatory Visit: Payer: Self-pay | Admitting: Neurology

## 2018-03-26 MED ORDER — ZONEGRAN 100 MG PO CAPS: 300.0000 mg | ORAL_CAPSULE | Freq: Two times a day (BID) | ORAL | 0 refills | Status: DC

## 2018-03-26 MED ORDER — KEPPRA 500 MG PO TABS: ORAL_TABLET | ORAL | 0 refills | Status: DC

## 2018-03-26 NOTE — Telephone Encounter (Signed)
I called again, left a message asking her to call me back. Pt should be scheduled sooner than November with either an NP or Dr. Anne HahnWillis.  We can keep calling pt to get her scheduled, but it will be up to Dr. Anne HahnWillis' discretion on refilling pt's keppra and zonegran since she has not been here in over a year.

## 2018-03-26 NOTE — Telephone Encounter (Signed)
I will refill the medication.  The patient gets Zonegran and Keppra through this office.

## 2018-03-26 NOTE — Telephone Encounter (Signed)
Left another message requesting patient to contact our office.  She will need to be seen sooner than November.

## 2018-04-22 ENCOUNTER — Ambulatory Visit: Payer: Self-pay | Admitting: Podiatry

## 2018-04-27 ENCOUNTER — Other Ambulatory Visit: Payer: Self-pay | Admitting: Neurology

## 2018-04-28 ENCOUNTER — Other Ambulatory Visit: Payer: Self-pay | Admitting: Neurology

## 2018-04-28 ENCOUNTER — Telehealth: Payer: Self-pay | Admitting: *Deleted

## 2018-04-28 NOTE — Telephone Encounter (Signed)
PA approved 01/28/18-04/28/19.

## 2018-04-28 NOTE — Telephone Encounter (Signed)
PA Keppra-name brand submitted on covermymeds. Key: [id number] - PA Case : [id number]: [id number] - Rx #: [id number]. Waiting on determination.   "Your information has been submitted to [employer]. [employer] will review the request and will issue a decision, typically within 1-3 days from your submission. If [employer] has not responded in 1-3 days or if you have any questions about your ePA request, please contact [employer] at [phone]."

## 2018-04-28 NOTE — Telephone Encounter (Signed)
Submitted urgent PA Zonegran brand name on covermymeds. Key: [id number] - PA Case : [id number]: [id number] - Rx #: [id number]. Waiting on determination.  "Your information has been submitted to [employer]. [employer] will review the request and will issue a decision, typically within 1-3 days from your submission. If [employer] has not responded in 1-3 days or if you have any questions about your ePA request, please contact [employer] at [phone]."

## 2018-04-28 NOTE — Telephone Encounter (Signed)
PA approved 01/28/18-04/28/19.   

## 2018-04-29 ENCOUNTER — Ambulatory Visit (INDEPENDENT_AMBULATORY_CARE_PROVIDER_SITE_OTHER): Payer: Medicare Other | Admitting: Podiatry

## 2018-04-29 ENCOUNTER — Encounter: Payer: Self-pay | Admitting: Podiatry

## 2018-04-29 DIAGNOSIS — B351 Tinea unguium: Secondary | ICD-10-CM | POA: Diagnosis not present

## 2018-04-29 DIAGNOSIS — M79609 Pain in unspecified limb: Secondary | ICD-10-CM | POA: Diagnosis not present

## 2018-04-29 DIAGNOSIS — M2011 Hallux valgus (acquired), right foot: Secondary | ICD-10-CM

## 2018-04-29 NOTE — Progress Notes (Signed)
Complaint:  Visit Type: Patient returns to my office for continued preventative foot care services. Complaint: Patient states" my nails have grown long and thick and become painful to walk and wear shoes" Patient has been diagnosed with DM with no foot complications. The patient presents for preventative foot care services. No changes to ROS.  Sister requests the nails are cut short.  Podiatric Exam: Vascular: dorsalis pedis and posterior tibial pulses are weakly  palpable bilateral. Capillary return is immediate.  Skin turgor WNL  Sensorium: Normal Semmes Weinstein monofilament test. Normal tactile sensation bilaterally. Nail Exam: Pt has thick disfigured discolored nails with subungual debris noted bilateral entire nail hallux through fifth toenails Ulcer Exam: There is no evidence of ulcer or pre-ulcerative changes or infection. Orthopedic Exam: Muscle tone and strength are WNL. No limitations in general ROM. No crepitus or effusions noted. Foot type and digits show no abnormalities. Bony prominences are unremarkable. Skin: No Porokeratosis. No infection or ulcers  Diagnosis:  Onychomycosis, , Pain in right toe, pain in left toes  Treatment & Plan Procedures and Treatment: Consent by patient was obtained for treatment procedures. The patient understood the discussion of treatment and procedures well. All questions were answered thoroughly reviewed. Debridement of mycotic and hypertrophic toenails, 1 through 5 bilateral and clearing of subungual debris. No ulceration, no infection noted.  Return Visit-Office Procedure: Patient instructed to return to the office for a follow up visit 3 months for continued evaluation and treatment.    Yazir Koerber DPM 

## 2018-05-27 ENCOUNTER — Other Ambulatory Visit: Payer: Self-pay | Admitting: Neurology

## 2018-06-25 ENCOUNTER — Other Ambulatory Visit: Payer: Self-pay | Admitting: Neurology

## 2018-06-29 ENCOUNTER — Other Ambulatory Visit: Payer: Self-pay | Admitting: Neurology

## 2018-07-24 ENCOUNTER — Other Ambulatory Visit: Payer: Self-pay | Admitting: Neurology

## 2018-08-02 ENCOUNTER — Ambulatory Visit (INDEPENDENT_AMBULATORY_CARE_PROVIDER_SITE_OTHER): Payer: Medicare Other | Admitting: Podiatry

## 2018-08-02 ENCOUNTER — Encounter: Payer: Self-pay | Admitting: Podiatry

## 2018-08-02 DIAGNOSIS — M2011 Hallux valgus (acquired), right foot: Secondary | ICD-10-CM

## 2018-08-02 DIAGNOSIS — M79609 Pain in unspecified limb: Secondary | ICD-10-CM

## 2018-08-02 DIAGNOSIS — B351 Tinea unguium: Secondary | ICD-10-CM | POA: Diagnosis not present

## 2018-08-02 DIAGNOSIS — E1151 Type 2 diabetes mellitus with diabetic peripheral angiopathy without gangrene: Secondary | ICD-10-CM

## 2018-08-02 DIAGNOSIS — E1142 Type 2 diabetes mellitus with diabetic polyneuropathy: Secondary | ICD-10-CM

## 2018-08-02 NOTE — Progress Notes (Signed)
Complaint:  Visit Type: Patient returns to my office for continued preventative foot care services. Complaint: Patient states" my nails have grown long and thick and become painful to walk and wear shoes" Patient has been diagnosed with DM with no foot complications. The patient presents for preventative foot care services. No changes to ROS.  Sister requests the nails are cut short.  Podiatric Exam: Vascular: dorsalis pedis and posterior tibial pulses are weakly  palpable bilateral. Capillary return is immediate.  Skin turgor WNL  Sensorium: Diminished  Semmes Weinstein monofilament test. Normal tactile sensation bilaterally. Nail Exam: Pt has thick disfigured discolored nails with subungual debris noted bilateral entire nail hallux through fifth toenails Ulcer Exam: There is no evidence of ulcer or pre-ulcerative changes or infection. Orthopedic Exam: Muscle tone and strength are WNL. No limitations in general ROM. No crepitus or effusions noted. Foot type and digits show no abnormalities. HAV  B/L Skin: No Porokeratosis. No infection or ulcers  Diagnosis:  Onychomycosis, , Pain in right toe, pain in left toes,  Diabetes with angiopathy and neuropathy.  Treatment & Plan Procedures and Treatment: Consent by patient was obtained for treatment procedures. The patient understood the discussion of treatment and procedures well. All questions were answered thoroughly reviewed. Debridement of mycotic and hypertrophic toenails, 1 through 5 bilateral and clearing of subungual debris. No ulceration, no infection noted. Initiate diabetic shoes for DPN and HAV  B/L. Return Visit-Office Procedure: Patient instructed to return to the office for a follow up visit 3 months for continued evaluation and treatment.    Helane Gunther DPM

## 2018-08-23 ENCOUNTER — Other Ambulatory Visit: Payer: Self-pay | Admitting: Neurology

## 2018-08-24 ENCOUNTER — Encounter: Payer: Self-pay | Admitting: Neurology

## 2018-08-24 ENCOUNTER — Ambulatory Visit (INDEPENDENT_AMBULATORY_CARE_PROVIDER_SITE_OTHER): Payer: Medicare Other | Admitting: Neurology

## 2018-08-24 VITALS — BP 103/78 | HR 103 | Ht 64.0 in | Wt 165.0 lb

## 2018-08-24 DIAGNOSIS — Z5181 Encounter for therapeutic drug level monitoring: Secondary | ICD-10-CM | POA: Diagnosis not present

## 2018-08-24 DIAGNOSIS — F79 Unspecified intellectual disabilities: Secondary | ICD-10-CM | POA: Diagnosis not present

## 2018-08-24 DIAGNOSIS — G40309 Generalized idiopathic epilepsy and epileptic syndromes, not intractable, without status epilepticus: Secondary | ICD-10-CM

## 2018-08-24 DIAGNOSIS — R269 Unspecified abnormalities of gait and mobility: Secondary | ICD-10-CM | POA: Diagnosis not present

## 2018-08-24 MED ORDER — ZONEGRAN 100 MG PO CAPS: 300.0000 mg | ORAL_CAPSULE | Freq: Two times a day (BID) | ORAL | 3 refills | Status: DC

## 2018-08-24 MED ORDER — KEPPRA 500 MG PO TABS: ORAL_TABLET | ORAL | 3 refills | Status: DC

## 2018-08-24 NOTE — Progress Notes (Signed)
Reason for visit: Seizures, intractable  Sandra Osborne is an 47 y.o. female  History of present illness:  Sandra Osborne is a 47 year old black female with a history of cerebral palsy with a right hemiparesis and mental retardation and intractable seizures.  Her seizures generally tend to be nocturnal in nature, she will have 1 or 2 seizures a month.  She remains on relatively high doses of Keppra and Zonegran.  The patient apparently is tolerating the medication fairly well.  The patient is eating well, she has not had any falls.  She has not hurt herself with any of the seizures.  The patient lives with her family who is her caretaker, she does require some assistance with bathing and dressing.  The patient comes to this office for an evaluation.  Past Medical History:  Diagnosis Date  . Cerebral palsy (HCC)   . Diabetes (HCC)   . Dyslipidemia   . Dysrhythmia    tachycardia  . History of heart disease   . Hypertension   . Intermittent explosive disorder   . Intractable seizures (HCC)   . Mental retardation   . Obesity     Past Surgical History:  Procedure Laterality Date  . COLONOSCOPY WITH PROPOFOL N/A 01/23/2017   Procedure: COLONOSCOPY WITH PROPOFOL;  Surgeon: Scot Jun, MD;  Location: [facility] ENDOSCOPY;  Service: Endoscopy;  Laterality: N/A;  . dental extractions      Family History  Problem Relation Age of Onset  . Lung cancer Mother   . Brain cancer Mother   . Breast cancer Neg Hx     Social history:  reports that she has never smoked. She has never used smokeless tobacco. She reports that she does not drink alcohol or use drugs.    Allergies  Allergen Reactions  . Depakote [Divalproex Sodium] Other (See Comments)  . Primidone Other (See Comments)    Medications:  Prior to Admission medications   Medication Sig Start Date End Date Taking? Authorizing Provider  calcium carbonate (OS-CAL) 600 MG TABS tablet Take 600 mg by mouth 2 (two) times daily with a  meal. Reported on 01/01/2016   Yes [provider]  cetirizine (ZYRTEC) 10 MG tablet Take 10 mg by mouth daily.   Yes [provider]  docusate sodium (COLACE) 100 MG capsule Take 100 mg by mouth daily.   Yes [provider]  fexofenadine (ALLEGRA) 180 MG tablet Take 180 mg by mouth daily as needed.   Yes [provider]  KEPPRA 500 MG tablet TAKE 2 TABLETS BY MOUTH TWICE DAILY **MUST KEEP PENDING APPT. 08/24/18 AT 2:30** 07/26/18  Yes York Spaniel, MD  lovastatin (MEVACOR) 20 MG tablet Take 20 mg by mouth at bedtime.   Yes [provider]  medroxyPROGESTERone (DEPO-PROVERA) 150 MG/ML injection Inject 150 mg into the muscle every 3 (three) months.   Yes [provider]  metoprolol (LOPRESSOR) 50 MG tablet  09/13/13  Yes [provider]  QUEtiapine (SEROQUEL) 200 MG tablet Take 200 mg by mouth 2 (two) times daily.   Yes [provider]  quinapril (ACCUPRIL) 10 MG tablet Take 10 mg by mouth at bedtime.   Yes [provider]  ZONEGRAN 100 MG capsule Take 3 capsules (300 mg total) by mouth 2 (two) times daily. **MUST KEEP PENDING APPT. 11/26 AT 2:30PM** 07/26/18  Yes York Spaniel, MD    ROS:  Out of a complete 14 system review of symptoms, the patient complains only  of the following symptoms, and all other reviewed systems are negative.  Snoring Seizures Runny nose  Blood pressure 103/78, pulse (!) 103, height 5\' 4"  (1.626 m), weight 165 lb (74.8 kg).  Physical Exam  General: The patient is alert and cooperative at the time of the examination.  Skin: No significant peripheral edema is noted.   Neurologic Exam  Mental status: The patient is alert, but is minimally verbal.  The patient has difficulty following verbal commands.   Cranial nerves: Facial symmetry is present. Speech is normal, no aphasia or dysarthria is noted. Extraocular movements are full, but on primary gaze there is exotropia of the  right eye. Visual fields are full to threat.  Motor: The patient has good strength in all 4 extremities, the patient seems to keep the right arm in slight flexion.  Sensory examination: Soft touch sensation is symmetric on the face, arms, and legs.  Coordination: The patient does not follow commands well enough to perform cerebellar testing.  Gait and station: The patient has a slightly wide-based gait, the patient can walk independently.  Reflexes: Deep tendon reflexes are symmetric.   Assessment/Plan:  1.  Mental retardation  2.  Intractable seizures  The patient will undergo blood work today, the caretaker indicates that she has had routine blood work done within the last several weeks by her primary care doctor, we will check Zonegran and Keppra levels today.  She was given a prescription for her medications and she will follow-up in 1 year.  The patient appears to be remaining relatively stable.  Sandra Osborne. Keith Willis MD 08/24/2018 2:35 PM  [facility] [address] [place], [place] [id number]  Phone [phone] Fax [phone]

## 2018-08-28 LAB — ZONISAMIDE LEVEL: ZONISAMIDE: 31.7 ug/mL (ref 10.0–40.0)

## 2018-08-28 LAB — LEVETIRACETAM LEVEL: LEVETIRACETAM: 37.3 ug/mL (ref 10.0–40.0)

## 2018-08-30 ENCOUNTER — Telehealth: Payer: Self-pay | Admitting: *Deleted

## 2018-08-30 NOTE — Telephone Encounter (Signed)
-----   Message from York Spanielharles K Willis, MD sent at 08/29/2018 10:40 AM EST ----- Levels of Keppra and Zonegran are therapeutic, no change in dosing. Please call the caretaker. ----- Message ----- From: Nell RangeInterface, [facility] Lab Results In Sent: 08/27/2018   9:35 AM EST To: York Spanielharles K Willis, MD

## 2018-08-30 NOTE — Telephone Encounter (Signed)
LMOM for Gena (caregiver) with below lab results; no change in med dosing.   She does not need to return this message unless she has questions/fim

## 2018-09-17 ENCOUNTER — Ambulatory Visit: Payer: Medicare Other | Admitting: Orthotics

## 2018-09-17 DIAGNOSIS — K648 Other hemorrhoids: Secondary | ICD-10-CM

## 2018-09-17 DIAGNOSIS — E1151 Type 2 diabetes mellitus with diabetic peripheral angiopathy without gangrene: Secondary | ICD-10-CM

## 2018-09-17 DIAGNOSIS — E1142 Type 2 diabetes mellitus with diabetic polyneuropathy: Secondary | ICD-10-CM

## 2018-09-17 DIAGNOSIS — M2010 Hallux valgus (acquired), unspecified foot: Secondary | ICD-10-CM

## 2018-09-17 DIAGNOSIS — M2011 Hallux valgus (acquired), right foot: Secondary | ICD-10-CM

## 2018-09-29 ENCOUNTER — Telehealth: Payer: Self-pay | Admitting: Orthotics

## 2018-09-29 NOTE — Progress Notes (Signed)

## 2018-11-01 ENCOUNTER — Ambulatory Visit (INDEPENDENT_AMBULATORY_CARE_PROVIDER_SITE_OTHER): Payer: Medicare Other | Admitting: Podiatry

## 2018-11-01 ENCOUNTER — Encounter: Payer: Self-pay | Admitting: Podiatry

## 2018-11-01 DIAGNOSIS — M79609 Pain in unspecified limb: Principal | ICD-10-CM

## 2018-11-01 DIAGNOSIS — M79676 Pain in unspecified toe(s): Secondary | ICD-10-CM | POA: Diagnosis not present

## 2018-11-01 DIAGNOSIS — M2011 Hallux valgus (acquired), right foot: Secondary | ICD-10-CM

## 2018-11-01 DIAGNOSIS — B351 Tinea unguium: Secondary | ICD-10-CM

## 2018-11-01 DIAGNOSIS — E1151 Type 2 diabetes mellitus with diabetic peripheral angiopathy without gangrene: Secondary | ICD-10-CM

## 2018-11-01 NOTE — Progress Notes (Addendum)
Complaint:  Visit Type: Patient returns to my office for continued preventative foot care services. Complaint: Patient states" my nails have grown long and thick and become painful to walk and wear shoes" Patient has been diagnosed with DM with no foot complications. The patient presents for preventative foot care services. No changes to ROS.    Podiatric Exam: Vascular: dorsalis pedis and posterior tibial pulses are weakly  palpable bilateral. Capillary return is immediate.  Skin turgor WNL  Sensorium: Diminished  Semmes Weinstein monofilament test. Normal tactile sensation bilaterally. Nail Exam: Pt has thick disfigured discolored nails with subungual debris noted bilateral entire nail hallux through fifth toenails Ulcer Exam: There is no evidence of ulcer or pre-ulcerative changes or infection. Orthopedic Exam: Muscle tone and strength are WNL. No limitations in general ROM. No crepitus or effusions noted. Foot type and digits show no abnormalities. HAV  B/L Skin: No Porokeratosis. No infection or ulcers  Diagnosis:  Onychomycosis, , Pain in right toe, pain in left toes,  Diabetes with angiopathy and neuropathy.  Treatment & Plan Procedures and Treatment: Consent by patient was obtained for treatment procedures. The patient understood the discussion of treatment and procedures well. All questions were answered thoroughly reviewed. Debridement of mycotic and hypertrophic toenails, 1 through 5 bilateral and clearing of subungual debris. No ulceration, no infection noted. Dispensed diabetic shoes.Patient presents today and was dispensed 0ne pair ( two units) of medically necessary extra depth shoes with three pair( six units) of custom molded multiple density inserts. The shoes and the inserts are fitted to the patients ' feet and are noted to fit well and are free of defect.  Length and width of the shoes are also acceptable.  Patient was given written and verbal  instructions for wearing.  If any  concerns arrive with the shoes or inserts, the patient is to call the office.Patient is to follow up with doctor in six weeks. Return Visit-Office Procedure: Patient instructed to return to the office for a follow up visit 3 months for continued evaluation and treatment.    Helane Gunther DPM

## 2019-01-31 ENCOUNTER — Ambulatory Visit: Payer: Medicare Other | Admitting: Podiatry

## 2019-02-17 ENCOUNTER — Encounter: Payer: Self-pay | Admitting: Podiatry

## 2019-02-17 ENCOUNTER — Other Ambulatory Visit: Payer: Self-pay

## 2019-02-17 ENCOUNTER — Ambulatory Visit (INDEPENDENT_AMBULATORY_CARE_PROVIDER_SITE_OTHER): Payer: Medicare Other | Admitting: Podiatry

## 2019-02-17 VITALS — Temp 96.7°F

## 2019-02-17 DIAGNOSIS — E1151 Type 2 diabetes mellitus with diabetic peripheral angiopathy without gangrene: Secondary | ICD-10-CM

## 2019-02-17 DIAGNOSIS — M2011 Hallux valgus (acquired), right foot: Secondary | ICD-10-CM

## 2019-02-17 DIAGNOSIS — M2010 Hallux valgus (acquired), unspecified foot: Secondary | ICD-10-CM

## 2019-02-17 DIAGNOSIS — B351 Tinea unguium: Secondary | ICD-10-CM

## 2019-02-17 DIAGNOSIS — M79676 Pain in unspecified toe(s): Secondary | ICD-10-CM | POA: Diagnosis not present

## 2019-02-17 NOTE — Progress Notes (Signed)
Complaint:  Visit Type: Patient returns to my office for continued preventative foot care services. Complaint: Patient states" my nails have grown long and thick and become painful to walk and wear shoes" Patient has been diagnosed with DM with no foot complications. The patient presents for preventative foot care services. No changes to ROS.  Sister requests the nails are cut short.  Podiatric Exam: Vascular: dorsalis pedis and posterior tibial pulses are weakly  palpable bilateral. Capillary return is immediate.  Skin turgor WNL  Sensorium: Normal Semmes Weinstein monofilament test. Normal tactile sensation bilaterally. Nail Exam: Pt has thick disfigured discolored nails with subungual debris noted bilateral entire nail hallux through fifth toenails Ulcer Exam: There is no evidence of ulcer or pre-ulcerative changes or infection. Orthopedic Exam: Muscle tone and strength are WNL. No limitations in general ROM. No crepitus or effusions noted. Foot type and digits show no abnormalities. Bony prominences are unremarkable. Skin: No Porokeratosis. No infection or ulcers  Diagnosis:  Onychomycosis, , Pain in right toe, pain in left toes  Treatment & Plan Procedures and Treatment: Consent by patient was obtained for treatment procedures. The patient understood the discussion of treatment and procedures well. All questions were answered thoroughly reviewed. Debridement of mycotic and hypertrophic toenails, 1 through 5 bilateral and clearing of subungual debris. No ulceration, no infection noted.  Return Visit-Office Procedure: Patient instructed to return to the office for a follow up visit 3 months for continued evaluation and treatment.    Aalivia Mcgraw DPM 

## 2019-02-21 ENCOUNTER — Other Ambulatory Visit: Payer: Self-pay | Admitting: Neurology

## 2019-03-22 ENCOUNTER — Other Ambulatory Visit: Payer: Self-pay | Admitting: Neurology

## 2019-03-24 ENCOUNTER — Other Ambulatory Visit: Payer: Self-pay | Admitting: Neurology

## 2019-04-18 ENCOUNTER — Telehealth: Payer: Self-pay | Admitting: Neurology

## 2019-04-18 NOTE — Telephone Encounter (Signed)
pts sister Rollene Fare called in and stated she needs a letter stating pts medical condition so they can get some extra help in taking care of her.    CB# [id number]

## 2019-04-19 NOTE — Telephone Encounter (Signed)
I contacted regina back at the number provided and left a message asking her to call me back.  I am trying to verify what the letter needs to say/inclued. [facility] # provided.

## 2019-04-20 NOTE — Telephone Encounter (Signed)
The letter was printed and signed. 

## 2019-04-20 NOTE — Telephone Encounter (Signed)
Pt's sister called back stating that she needs a letter stating that the pt does suffer from seizures and Cerebral Palsy so that she is able to get help for the pt. Please advise.

## 2019-04-21 NOTE — Telephone Encounter (Signed)
Called, LVM for Rollene Fare to let her know letter ready for pick up. Placed up front for pick up.

## 2019-05-05 ENCOUNTER — Ambulatory Visit: Payer: Medicare Other | Admitting: Obstetrics & Gynecology

## 2019-05-19 ENCOUNTER — Encounter: Payer: Self-pay | Admitting: Podiatry

## 2019-05-19 ENCOUNTER — Ambulatory Visit (INDEPENDENT_AMBULATORY_CARE_PROVIDER_SITE_OTHER): Payer: Medicare Other | Admitting: Podiatry

## 2019-05-19 ENCOUNTER — Other Ambulatory Visit: Payer: Self-pay

## 2019-05-19 VITALS — Temp 97.3°F

## 2019-05-19 DIAGNOSIS — B351 Tinea unguium: Secondary | ICD-10-CM | POA: Diagnosis not present

## 2019-05-19 DIAGNOSIS — E1151 Type 2 diabetes mellitus with diabetic peripheral angiopathy without gangrene: Secondary | ICD-10-CM

## 2019-05-19 DIAGNOSIS — M79609 Pain in unspecified limb: Secondary | ICD-10-CM | POA: Diagnosis not present

## 2019-05-19 NOTE — Progress Notes (Signed)
Complaint:  Visit Type: Patient returns to my office for continued preventative foot care services. Complaint: Patient states" my nails have grown long and thick and become painful to walk and wear shoes" Patient has been diagnosed with DM with no foot complications. The patient presents for preventative foot care services. No changes to ROS.  Sister requests the nails are cut short.  Podiatric Exam: Vascular: dorsalis pedis and posterior tibial pulses are weakly  palpable bilateral. Capillary return is immediate.  Skin turgor WNL  Sensorium: Normal Semmes Weinstein monofilament test. Normal tactile sensation bilaterally. Nail Exam: Pt has thick disfigured discolored nails with subungual debris noted bilateral entire nail hallux through fifth toenails Ulcer Exam: There is no evidence of ulcer or pre-ulcerative changes or infection. Orthopedic Exam: Muscle tone and strength are WNL. No limitations in general ROM. No crepitus or effusions noted. Foot type and digits show no abnormalities. Bony prominences are unremarkable. Skin: No Porokeratosis. No infection or ulcers  Diagnosis:  Onychomycosis, , Pain in right toe, pain in left toes  Treatment & Plan Procedures and Treatment: Consent by patient was obtained for treatment procedures. The patient understood the discussion of treatment and procedures well. All questions were answered thoroughly reviewed. Debridement of mycotic and hypertrophic toenails, 1 through 5 bilateral and clearing of subungual debris. No ulceration, no infection noted.  Return Visit-Office Procedure: Patient instructed to return to the office for a follow up visit 3 months for continued evaluation and treatment.    Gardiner Barefoot DPM

## 2019-05-24 ENCOUNTER — Telehealth: Payer: Self-pay | Admitting: Neurology

## 2019-05-24 ENCOUNTER — Telehealth: Payer: Self-pay | Admitting: *Deleted

## 2019-05-24 NOTE — Telephone Encounter (Signed)
PA submitted through cover my meds/ [facility] [employer] [id number] Will wait to hear a response

## 2019-05-24 NOTE — Telephone Encounter (Signed)
PA completed and approved from 02/23/19 until 05/23/2020 through [facility].

## 2019-05-24 NOTE — Telephone Encounter (Signed)
Started Keppra (brand necessary) on CMM, key: [id number]. She cannot take Spritam or Roweepra because HTN is side effect.  Patient has HTN.  Your information has been submitted to [employer]. [employer] will review the request and will issue a decision, typically within 1-3 days from your submission. You can check the updated outcome later by reopening this request. If [employer] has not responded in 1-3 days or if you have any questions about your ePA request, please contact [place] [employer] at [phone].

## 2019-05-25 NOTE — Telephone Encounter (Addendum)
Brand name Keppra approved thru Medicare Part D Prescription Drug coverage, non-formulary. Authorized  02/23/2019 - 05/23/2020. Approval letter faxed to [facility].

## 2019-07-20 ENCOUNTER — Other Ambulatory Visit: Payer: Self-pay | Admitting: Neurology

## 2019-08-04 ENCOUNTER — Other Ambulatory Visit: Payer: Self-pay

## 2019-08-04 ENCOUNTER — Inpatient Hospital Stay
Admission: EM | Admit: 2019-08-04 | Discharge: 2019-08-10 | DRG: 414 | Disposition: A | Discharge status: Home Health | Payer: Medicare Other | Attending: Internal Medicine | Admitting: Internal Medicine

## 2019-08-04 ENCOUNTER — Encounter: Payer: Self-pay | Admitting: Emergency Medicine

## 2019-08-04 ENCOUNTER — Emergency Department: Payer: Medicare Other

## 2019-08-04 DIAGNOSIS — F6381 Intermittent explosive disorder: Secondary | ICD-10-CM | POA: Diagnosis present

## 2019-08-04 DIAGNOSIS — Z20828 Contact with and (suspected) exposure to other viral communicable diseases: Secondary | ICD-10-CM | POA: Diagnosis present

## 2019-08-04 DIAGNOSIS — Z808 Family history of malignant neoplasm of other organs or systems: Secondary | ICD-10-CM | POA: Diagnosis not present

## 2019-08-04 DIAGNOSIS — R52 Pain, unspecified: Secondary | ICD-10-CM

## 2019-08-04 DIAGNOSIS — Z888 Allergy status to other drugs, medicaments and biological substances status: Secondary | ICD-10-CM

## 2019-08-04 DIAGNOSIS — F79 Unspecified intellectual disabilities: Secondary | ICD-10-CM | POA: Diagnosis present

## 2019-08-04 DIAGNOSIS — R17 Unspecified jaundice: Secondary | ICD-10-CM

## 2019-08-04 DIAGNOSIS — E669 Obesity, unspecified: Secondary | ICD-10-CM | POA: Diagnosis present

## 2019-08-04 DIAGNOSIS — G809 Cerebral palsy, unspecified: Secondary | ICD-10-CM | POA: Diagnosis present

## 2019-08-04 DIAGNOSIS — R569 Unspecified convulsions: Secondary | ICD-10-CM

## 2019-08-04 DIAGNOSIS — K648 Other hemorrhoids: Secondary | ICD-10-CM | POA: Diagnosis present

## 2019-08-04 DIAGNOSIS — K859 Acute pancreatitis without necrosis or infection, unspecified: Secondary | ICD-10-CM | POA: Diagnosis present

## 2019-08-04 DIAGNOSIS — Z419 Encounter for procedure for purposes other than remedying health state, unspecified: Secondary | ICD-10-CM

## 2019-08-04 DIAGNOSIS — E785 Hyperlipidemia, unspecified: Secondary | ICD-10-CM | POA: Diagnosis present

## 2019-08-04 DIAGNOSIS — E119 Type 2 diabetes mellitus without complications: Secondary | ICD-10-CM | POA: Diagnosis not present

## 2019-08-04 DIAGNOSIS — G40909 Epilepsy, unspecified, not intractable, without status epilepticus: Secondary | ICD-10-CM | POA: Diagnosis present

## 2019-08-04 DIAGNOSIS — Z801 Family history of malignant neoplasm of trachea, bronchus and lung: Secondary | ICD-10-CM | POA: Diagnosis not present

## 2019-08-04 DIAGNOSIS — I10 Essential (primary) hypertension: Secondary | ICD-10-CM | POA: Diagnosis present

## 2019-08-04 DIAGNOSIS — R1011 Right upper quadrant pain: Secondary | ICD-10-CM

## 2019-08-04 DIAGNOSIS — R197 Diarrhea, unspecified: Secondary | ICD-10-CM | POA: Diagnosis present

## 2019-08-04 DIAGNOSIS — Z6830 Body mass index (BMI) 30.0-30.9, adult: Secondary | ICD-10-CM

## 2019-08-04 DIAGNOSIS — Z79899 Other long term (current) drug therapy: Secondary | ICD-10-CM

## 2019-08-04 DIAGNOSIS — K8063 Calculus of gallbladder and bile duct with acute cholecystitis with obstruction: Secondary | ICD-10-CM | POA: Diagnosis present

## 2019-08-04 DIAGNOSIS — K831 Obstruction of bile duct: Secondary | ICD-10-CM | POA: Diagnosis not present

## 2019-08-04 DIAGNOSIS — K8020 Calculus of gallbladder without cholecystitis without obstruction: Secondary | ICD-10-CM

## 2019-08-04 DIAGNOSIS — K8050 Calculus of bile duct without cholangitis or cholecystitis without obstruction: Secondary | ICD-10-CM | POA: Diagnosis not present

## 2019-08-04 DIAGNOSIS — R000 Tachycardia, unspecified: Secondary | ICD-10-CM | POA: Diagnosis present

## 2019-08-04 LAB — CBC
HCT: 45.2 % (ref 36.0–46.0)
Hemoglobin: 14.3 g/dL (ref 12.0–15.0)
MCH: 28.9 pg (ref 26.0–34.0)
MCHC: 31.6 g/dL (ref 30.0–36.0)
MCV: 91.3 fL (ref 80.0–100.0)
Platelets: 163 10*3/uL (ref 150–400)
RBC: 4.95 MIL/uL (ref 3.87–5.11)
RDW: 13.1 % (ref 11.5–15.5)
WBC: 11 10*3/uL — ABNORMAL HIGH (ref 4.0–10.5)
nRBC: 0 % (ref 0.0–0.2)

## 2019-08-04 LAB — COMPREHENSIVE METABOLIC PANEL
ALT: 182 U/L — ABNORMAL HIGH (ref 0–44)
AST: 120 U/L — ABNORMAL HIGH (ref 15–41)
Albumin: 3.1 g/dL — ABNORMAL LOW (ref 3.5–5.0)
Alkaline Phosphatase: 350 U/L — ABNORMAL HIGH (ref 38–126)
Anion gap: 12 (ref 5–15)
BUN: UNDETERMINED mg/dL (ref 6–20)
CO2: 19 mmol/L — ABNORMAL LOW (ref 22–32)
Calcium: 8.9 mg/dL (ref 8.9–10.3)
Chloride: 110 mmol/L (ref 98–111)
Creatinine, Ser: <0.3 mg/dL — ABNORMAL LOW (ref 0.44–1.00)
Glucose, Bld: 127 mg/dL — ABNORMAL HIGH (ref 70–99)
Potassium: 3.8 mmol/L (ref 3.5–5.1)
Sodium: 141 mmol/L (ref 135–145)
Total Bilirubin: 5.4 mg/dL — ABNORMAL HIGH (ref 0.3–1.2)
Total Protein: 7.5 g/dL (ref 6.5–8.1)

## 2019-08-04 LAB — LIPASE, BLOOD: Lipase: 40 U/L (ref 11–51)

## 2019-08-04 MED ORDER — PIPERACILLIN-TAZOBACTAM 3.375 G IVPB 30 MIN
3.3750 g | Freq: Once | INTRAVENOUS | Status: AC
  Administered 2019-08-05: 01:00:00 3.375 g via INTRAVENOUS
  Filled 2019-08-04: qty 50

## 2019-08-04 MED ORDER — LISINOPRIL 10 MG PO TABS
10.0000 mg | ORAL_TABLET | Freq: Every day | ORAL | Status: DC
  Administered 2019-08-06 – 2019-08-10 (×2): 10 mg via ORAL
  Filled 2019-08-04 (×3): qty 1

## 2019-08-04 MED ORDER — QUETIAPINE FUMARATE 25 MG PO TABS
200.0000 mg | ORAL_TABLET | Freq: Two times a day (BID) | ORAL | Status: DC
  Administered 2019-08-05 – 2019-08-06 (×2): 200 mg via ORAL
  Filled 2019-08-04 (×3): qty 8

## 2019-08-04 MED ORDER — ONDANSETRON HCL 4 MG/2ML IJ SOLN
4.0000 mg | Freq: Once | INTRAMUSCULAR | Status: AC
  Administered 2019-08-04: 22:00:00 4 mg via INTRAVENOUS
  Filled 2019-08-04: qty 2

## 2019-08-04 MED ORDER — SODIUM CHLORIDE 0.9 % IV SOLN
Freq: Once | INTRAVENOUS | Status: AC
  Administered 2019-08-05: 01:00:00 via INTRAVENOUS

## 2019-08-04 MED ORDER — CALCIUM CARBONATE-VITAMIN D 500-200 MG-UNIT PO TABS
1.0000 | ORAL_TABLET | Freq: Two times a day (BID) | ORAL | Status: DC
  Administered 2019-08-05 – 2019-08-10 (×9): 1 via ORAL
  Filled 2019-08-04 (×9): qty 1

## 2019-08-04 MED ORDER — METOPROLOL TARTRATE 50 MG PO TABS
50.0000 mg | ORAL_TABLET | Freq: Two times a day (BID) | ORAL | Status: DC
  Administered 2019-08-05 – 2019-08-10 (×6): 50 mg via ORAL
  Filled 2019-08-04 (×9): qty 1

## 2019-08-04 MED ORDER — LORATADINE 10 MG PO TABS: 10.0000 mg | ORAL_TABLET | Freq: Every day | ORAL | Status: DC

## 2019-08-04 MED ORDER — SODIUM CHLORIDE 0.9 % IV BOLUS
500.0000 mL | Freq: Once | INTRAVENOUS | Status: AC
  Administered 2019-08-04: 500 mL via INTRAVENOUS

## 2019-08-04 MED ORDER — ZONISAMIDE 100 MG PO CAPS
300.0000 mg | ORAL_CAPSULE | Freq: Two times a day (BID) | ORAL | Status: DC
  Administered 2019-08-05 – 2019-08-10 (×10): 300 mg via ORAL
  Filled 2019-08-04 (×13): qty 3

## 2019-08-04 MED ORDER — LORATADINE 10 MG PO TABS
10.0000 mg | ORAL_TABLET | Freq: Every day | ORAL | Status: DC
  Administered 2019-08-06 – 2019-08-10 (×5): 10 mg via ORAL
  Filled 2019-08-04 (×5): qty 1

## 2019-08-04 MED ORDER — DOCUSATE SODIUM 100 MG PO CAPS
100.0000 mg | ORAL_CAPSULE | Freq: Every day | ORAL | Status: DC
  Administered 2019-08-06 – 2019-08-10 (×4): 100 mg via ORAL
  Filled 2019-08-04 (×5): qty 1

## 2019-08-04 MED ORDER — MORPHINE SULFATE (PF) 2 MG/ML IV SOLN
2.0000 mg | INTRAVENOUS | Status: DC | PRN
  Administered 2019-08-04: 2 mg via INTRAVENOUS
  Filled 2019-08-04 (×2): qty 1

## 2019-08-04 MED ORDER — LEVETIRACETAM 500 MG PO TABS
1000.0000 mg | ORAL_TABLET | Freq: Two times a day (BID) | ORAL | Status: DC
  Filled 2019-08-04 (×3): qty 2

## 2019-08-04 NOTE — ED Triage Notes (Signed)
Pt family reports pt with abdominal cramps for a couple of days. Family denies other sx's. Family reports pt with decreased appetite and says "owe owe" to her stomach.

## 2019-08-04 NOTE — ED Provider Notes (Signed)
[facility] Emergency Department Provider Note    First MD Initiated Contact with Patient 08/04/19 2042     (approximate)  I have reviewed the triage vital signs and the nursing notes.   HISTORY  Chief Complaint Abdominal Pain  Level V Caveat:  CP  HPI Sandra Osborne is a [age] y.o. female the below listed past medical history presents to the ER for evaluation of  epigastric discomfort for the past 2 days.  Is associated with nausea.  Patient poor historian given history of cerebral palsy.  No measured fevers.  Did have an episodes of watery diarrhea.  Is never had symptoms like this before.   Past Medical History:  Diagnosis Date  . Cerebral palsy (HCC)   . Diabetes (HCC)   . Dyslipidemia   . Dysrhythmia    tachycardia  . History of heart disease   . Hypertension   . Intermittent explosive disorder   . Intractable seizures (HCC)   . Mental retardation   . Obesity    Family History  Problem Relation Age of Onset  . Lung cancer Mother   . Brain cancer Mother   . Breast cancer Neg Hx    Past Surgical History:  Procedure Laterality Date  . COLONOSCOPY WITH PROPOFOL N/A 01/23/2017   Procedure: COLONOSCOPY WITH PROPOFOL;  Surgeon: Scot Junobert T Elliott, MD;  [facility] END[facility]COPY;  Service: Endoscopy;  Laterality: N/A;  . dental extractions     Patient Active Problem List   Diagnosis Date Noted  . Choledocholithiasis 08/04/2019  . Internal hemorrhoid, bleeding   . Seizure disorder, 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  . Abnormality of gait 12/15/2014  . Intellectual disability 07/26/2013  . Other forms of epilepsy and recurrent seizures without mention of intractable epilepsy 07/26/2013      Prior to Admission medications   Medication Sig Start Date End Date Taking? Authorizing Provider  calcium carbonate (OS-CAL) 600 MG TABS tablet Take 600 mg by mouth 2 (two) times daily with a meal. Reported on 01/01/2016   Yes [provider]   cetirizine (ZYRTEC) 10 MG tablet Take 10 mg by mouth daily.   Yes [provider]  docusate sodium (COLACE) 100 MG capsule Take 100 mg by mouth daily.   Yes [provider]  fexofenadine (ALLEGRA) 180 MG tablet Take 180 mg by mouth daily as needed.   Yes [provider]  KEPPRA 500 MG tablet TAKE 2 TABLETS BY MOUTH TWICE DAILY 07/20/19  Yes York SpanielWillis, Charles K, MD  lovastatin (MEVACOR) 20 MG tablet Take 20 mg by mouth at bedtime.   Yes [provider]  medroxyPROGESTERone (DEPO-PROVERA) 150 MG/ML injection Inject 150 mg into the muscle every 3 (three) months.   Yes [provider]  metoprolol (LOPRESSOR) 50 MG tablet Take 50 mg by mouth 2 (two) times daily.  09/13/13  Yes [provider]  QUEtiapine (SEROQUEL) 200 MG tablet Take 200 mg by mouth 2 (two) times daily.   Yes [provider]  quinapril (ACCUPRIL) 10 MG tablet Take 10 mg by mouth at bedtime.   Yes [provider]  ZONEGRAN 100 MG capsule TAKE 3 CAPSULES BY MOUTH TWICE DAILY 07/20/19  Yes York SpanielWillis, Charles K, MD    Allergies Depakote [divalproex sodium] and Primidone    Social History Social History   Tobacco Use  . Smoking status: Never Smoker  . Smokeless tobacco: Never Used  Substance Use Topics  . Alcohol use: No  . Drug use: No  Review of Systems Patient denies headaches, rhinorrhea, blurry vision, numbness, shortness of breath, chest pain, edema, cough, abdominal pain, nausea, vomiting, diarrhea, dysuria, fevers, rashes or hallucinations unless otherwise stated above in HPI. ____________________________________________   PHYSICAL EXAM:  VITAL SIGNS: Vitals:   08/04/19 1711  BP: (!) 132/107  Pulse: (!) 57  Resp: 18  Temp: 97.9 F (36.6 C)  SpO2: 100%    Constitutional: Alert and oriented.  Eyes: Conjunctivae are normal.  Head: Atraumatic. Nose: No congestion/rhinnorhea. Mouth/Throat: Mucous membranes are moist.   Neck: No stridor.  Painless ROM.  Cardiovascular: Normal rate, regular rhythm. Grossly normal heart sounds.  Good peripheral circulation. Respiratory: Normal respiratory effort.  No retractions. Lungs CTAB. Gastrointestinal: Soft with mild epigastric ttp. No distention. No abdominal bruits. No CVA tenderness. Genitourinary: deferred Musculoskeletal: No lower extremity tenderness nor edema.  No joint effusions. Neurologic:  Normal speech and language. No gross focal neurologic deficits are appreciated. No facial droop Skin:  Skin is warm, dry and intact. No rash noted. Psychiatric: Mood and affect are normal. Speech and behavior are normal.  ____________________________________________   LABS (all labs ordered are listed, but only abnormal results are displayed)  Results for orders placed or performed during the hospital encounter of 08/04/19 (from the past 24 hour(s))  Lipase, blood     Status: None   Collection Time: 08/04/19  5:52 PM  Result Value Ref Range   Lipase 40 11 - 51 U/L  Comprehensive metabolic panel     Status: Abnormal   Collection Time: 08/04/19  5:52 PM  Result Value Ref Range   Sodium 141 135 - 145 mmol/L   Potassium 3.8 3.5 - 5.1 mmol/L   Chloride 110 98 - 111 mmol/L   CO2 19 (L) 22 - 32 mmol/L   Glucose, Bld 127 (H) 70 - 99 mg/dL   BUN QUANTITY NOT SUFFICIENT, UNABLE TO PERFORM TEST 6 - 20 mg/dL   Creatinine, Ser <0.30 (L) 0.44 - 1.00 mg/dL   Calcium 8.9 8.9 - 10.3 mg/dL   Total Protein 7.5 6.5 - 8.1 g/dL   Albumin 3.1 (L) 3.5 - 5.0 g/dL   AST 120 (H) 15 - 41 U/L   ALT 182 (H) 0 - 44 U/L   Alkaline Phosphatase 350 (H) 38 - 126 U/L   Total Bilirubin 5.4 (H) 0.3 - 1.2 mg/dL   GFR calc non Af Amer NOT CALCULATED >60 mL/min   GFR calc Af Amer NOT CALCULATED >60 mL/min   Anion gap 12 5 - 15  CBC     Status: Abnormal   Collection Time: 08/04/19  5:52 PM  Result Value Ref Range   WBC 11.0 (H) 4.0 - 10.5 K/uL   RBC 4.95 3.87 - 5.11 MIL/uL   Hemoglobin 14.3 12.0 - 15.0 g/dL    HCT 45.2 36.0 - 46.0 %   MCV 91.3 80.0 - 100.0 fL   MCH 28.9 26.0 - 34.0 pg   MCHC 31.6 30.0 - 36.0 g/dL   RDW 13.1 11.5 - 15.5 %   Platelets 163 150 - 400 K/uL   nRBC 0.0 0.0 - 0.2 %   ____________________________________________ ____________________________________________  RADIOLOGY  I personally reviewed all radiographic images ordered to evaluate for the above acute complaints and reviewed radiology reports and findings.  These findings were personally discussed with the patient.  Please see medical record for radiology report.  ____________________________________________   PROCEDURES  Procedure(s) performed:  Procedures    Critical Care performed: no ____________________________________________   INITIAL  IMPRESSION / ASSESSMENT AND PLAN / ED COURSE  Pertinent labs & imaging results that were available during my care of the patient were reviewed by me and considered in my medical decision making (see chart for details).   DDX: cholelithiasis, cholecystitis, cholangitis, pancreatitis, choledocholithiasis  Sandra Osborne is a 48 y.o. who presents to the ED with epigastric discomfort as described above she is afebrile hemodynamically stable.  Does have some moderate discomfort does have evidence of transaminitis with elevated bilirubin.  Lipase is normal.  Upper quadrant ultrasound will be ordered for above differential.  Will provide IV fluids as well as IV pain medication and antiemetic  Clinical Course as of Aug 03 2321  Thu Aug 04, 2019  2152 Ultrasound without any evidence of cholecystitis but does have biliary obstruction with LFT elevation with enlarged common bile duct and suspected multiple gallbladder stones.  Will discuss with hospitalist for admission.   [PR]  2224 Case discussed in consultation with Dr. Servando Snare of GI.  Plan will be admit to hospitalist for IV fluids and evaluation for possible ERCP tomorrow.   [PR]    Clinical Course User Index [PR]  Willy Eddy, MD    The patient was evaluated in Emergency Department today for the symptoms described in the history of present illness. He/she was evaluated in the context of the global COVID-19 pandemic, which necessitated consideration that the patient might be at risk for infection with the SARS-CoV-2 virus that causes COVID-19. Institutional protocols and algorithms that pertain to the evaluation of patients at risk for COVID-19 are in a state of rapid change based on information released by regulatory bodies including the CDC and federal and state organizations. These policies and algorithms were followed during the patient's care in the ED.  As part of my medical decision making, I reviewed the following data within the electronic MEDICAL RECORD NUMBER Nursing notes reviewed and incorporated, Labs reviewed, notes from prior ED visits and [place] Controlled Substance Database   ____________________________________________   FINAL CLINICAL IMPRESSION(S) / ED DIAGNOSES  Final diagnoses:  RUQ pain  Total bilirubin, elevated      NEW MEDICATIONS STARTED DURING THIS VISIT:  New Prescriptions   No medications on file     Note:  This document was prepared using Dragon voice recognition software and may include unintentional dictation errors.    Willy Eddy, MD 08/04/19 2322

## 2019-08-04 NOTE — H&P (Addendum)
[facility] [place] NAME: Sandra Osborne    MR#:  [id number]  DATE OF BIRTH:  03-02-1971  DATE OF ADMISSION:  08/04/2019  PRIMARY CARE PHYSICIAN: Donnie Coffin, MD   REQUESTING/REFERRING PHYSICIAN: Merlyn Lot, MD  CHIEF COMPLAINT:   Chief Complaint  Patient presents with  . Abdominal Pain  The patient was seen and examined on 08/04/2019  HISTORY OF PRESENT ILLNESS:  Sandra Osborne  is a 48 y.o. African-American female with a known history of cerebral palsy, type 2 diabetes mellitus, dyslipidemia and hypertension, as well as seizure disorder, presented to the emergency room with acute onset of epigastric abdominal pain with associated nausea and vomiting with one episode of diarrhea.  No bilious vomitus or hematemesis.  No reported fever or chills.  Her family member was given the history as the patient has a fairly poor historian due to cerebral palsy.  No chest pain or palpitations.  Upon presentation to the emergency room, blood pressure was 132/107 with a pulse of 57 otherwise normal vital signs.  Labs revealed elevated AST and ALT as well as alk phos with a lipase of 40 and total bilirubin of 5.4 with minimal leukocytosis.  COVID-19 test is pending.  Right upper ultrasound revealed multiple gallbladder stones with no features to suggest acute cholecystitis as well as dilated common bile duct measuring up to 19 mm.  Dr. Allen Norris was contacted and is aware about the patient.  The patient was given IV Zosyn, hydration with IV normal saline and 2 mg of IV morphine sulfate.  She will be admitted to medical bed for further evaluation and management. PAST MEDICAL HISTORY:   Past Medical History:  Diagnosis Date  . Cerebral palsy ([place])   . Diabetes ([place])   . Dyslipidemia   . Dysrhythmia    tachycardia  . History of heart disease   . Hypertension   . Intermittent explosive disorder   . Intractable seizures ([place])   . Mental retardation   . Obesity      PAST SURGICAL HISTORY:   Past Surgical History:  Procedure Laterality Date  . COLONOSCOPY WITH PROPOFOL N/A 01/23/2017   Procedure: COLONOSCOPY WITH PROPOFOL;  Surgeon: Manya Silvas, MD;  Location: [facility] ENDOSCOPY;  Service: Endoscopy;  Laterality: N/A;  . dental extractions      SOCIAL HISTORY:   Social History   Tobacco Use  . Smoking status: Never Smoker  . Smokeless tobacco: Never Used  Substance Use Topics  . Alcohol use: No    FAMILY HISTORY:   Family History  Problem Relation Age of Onset  . Lung cancer Mother   . Brain cancer Mother   . Breast cancer Neg Hx     DRUG ALLERGIES:   Allergies  Allergen Reactions  . Depakote [Divalproex Sodium] Other (See Comments)  . Primidone Other (See Comments)    REVIEW OF SYSTEMS:   ROS As per history of present illness. All pertinent systems were reviewed above. Constitutional,  HEENT, cardiovascular, respiratory, GI, GU, musculoskeletal, neuro, psychiatric, endocrine,  integumentary and hematologic systems were reviewed and are otherwise  negative/unremarkable except for positive findings mentioned above in the HPI.   MEDICATIONS AT HOME:   Prior to Admission medications   Medication Sig Start Date End Date Taking? Authorizing Provider  calcium carbonate (OS-CAL) 600 MG TABS tablet Take 600 mg by mouth 2 (two) times daily with a meal. Reported on 01/01/2016   Yes [provider]  cetirizine (ZYRTEC)  10 MG tablet Take 10 mg by mouth daily.   Yes [provider]  docusate sodium (COLACE) 100 MG capsule Take 100 mg by mouth daily.   Yes [provider]  fexofenadine (ALLEGRA) 180 MG tablet Take 180 mg by mouth daily as needed.   Yes [provider]  KEPPRA 500 MG tablet TAKE 2 TABLETS BY MOUTH TWICE DAILY 07/20/19  Yes Kathrynn Ducking, MD  lovastatin (MEVACOR) 20 MG tablet Take 20 mg by mouth at bedtime.   Yes [provider]  medroxyPROGESTERone (DEPO-PROVERA) 150 MG/ML  injection Inject 150 mg into the muscle every 3 (three) months.   Yes [provider]  metoprolol (LOPRESSOR) 50 MG tablet Take 50 mg by mouth 2 (two) times daily.  09/13/13  Yes [provider]  QUEtiapine (SEROQUEL) 200 MG tablet Take 200 mg by mouth 2 (two) times daily.   Yes [provider]  quinapril (ACCUPRIL) 10 MG tablet Take 10 mg by mouth at bedtime.   Yes [provider]  ZONEGRAN 100 MG capsule TAKE 3 CAPSULES BY MOUTH TWICE DAILY 07/20/19  Yes Kathrynn Ducking, MD      VITAL SIGNS:  Blood pressure (!) 132/107, pulse (!) 57, temperature 97.9 F (36.6 C), temperature source Oral, resp. rate 18, height 5' 4"  (1.626 m), weight 81.6 kg, SpO2 100 %.  PHYSICAL EXAMINATION:  Physical Exam  GENERAL:  48 y.o.-year-old African-American female patient lying in the bed with no acute distress.  EYES: Pupils equal, round, reactive to light and accommodation. No scleral icterus. Extraocular muscles intact.  HEENT: Head atraumatic, normocephalic. Oropharynx and nasopharynx clear.  NECK:  Supple, no jugular venous distention. No thyroid enlargement, no tenderness.  LUNGS: Normal breath sounds bilaterally, no wheezing, rales,rhonchi or crepitation. No use of accessory muscles of respiration.  CARDIOVASCULAR: Regular rate and rhythm, S1, S2 normal. No murmurs, rubs, or gallops.  ABDOMEN: Soft, nondistended with epigastric and mild right upper quadrant tenderness without rebound tenderness guarding or rigidity.  Bowel sounds present. No organomegaly or mass.  EXTREMITIES: No pedal edema, cyanosis, or clubbing.  NEUROLOGIC: Cranial nerves II through XII are intact. Muscle strength 5/5 in all extremities. Sensation intact. Gait not checked.  PSYCHIATRIC: The patient is alert and cooperative.  Normal affect and good eye contact. SKIN: No obvious rash, lesion, or ulcer.   LABORATORY PANEL:   CBC Recent Labs  Lab 08/04/19 1752  WBC 11.0*  HGB 14.3  HCT 45.2   PLT 163   ------------------------------------------------------------------------------------------------------------------  Chemistries  Recent Labs  Lab 08/04/19 1752  NA 141  K 3.8  CL 110  CO2 19*  GLUCOSE 127*  BUN QUANTITY NOT SUFFICIENT, UNABLE TO PERFORM TEST  CREATININE <0.30*  CALCIUM 8.9  AST 120*  ALT 182*  ALKPHOS 350*  BILITOT 5.4*   ------------------------------------------------------------------------------------------------------------------  Cardiac Enzymes No results for input(s): TROPONINI in the last 168 hours. ------------------------------------------------------------------------------------------------------------------  RADIOLOGY:  US Abdomen Limited Ruq  Result Date: 08/04/2019 CLINICAL DATA:  Right upper quadrant pain EXAM: ULTRASOUND ABDOMEN LIMITED RIGHT UPPER QUADRANT COMPARISON:  None. FINDINGS: Gallbladder: Diffuse shadowing within the gallbladder presumably due to a large shadowing stone or multiple shadowing stones. Negative sonographic Murphy. Normal wall thickness of 2.9 mm Common bile duct: Diameter: 19.4 mm Liver: No focal lesion identified. Within normal limits in parenchymal echogenicity. Portal vein is patent on color Doppler imaging with normal direction of blood flow towards the liver. Other: None. IMPRESSION: 1. Shadowing at the gallbladder fossa presumably due to large  or multiple stones in the gallbladder. No other features to suggest an acute cholecystitis 2. Dilated common bile duct measuring up to 19 mm. Recommend correlation with LFTs and consider further evaluation with MRCP Electronically Signed   By: Donavan Foil M.D.   On: 08/04/2019 21:38      IMPRESSION AND PLAN:   1.  Cholelithiasis and choledocholithiasis with associated obstructive jaundice.  The patient will be admitted to a medical monitored bed.  She will be placed on hydration with IV normal saline and pain management will be provided.  We will continue her on  IV Rocephin 1 g daily.  A GI consultation will be obtained by Dr. Allen Norris.  I notified him regarding the patient.  She will be kept n.p.o. after midnight.  We will obtain an MRCP.  2.  Hypertension.  We will continue Lopressor and Zestril.  3.  Seizure disorder.  We will continue Zonegran and Keppra  4.  Cerebral palsy, mental retardation with behavior changes.  We will continue Seroquel and trazodone.  5.  DVT prophylaxis.  Subcutaneous Lovenox.  All the records are reviewed and case discussed with ED provider. The plan of care was discussed in details with the patient (and family). I answered all questions. The patient agreed to proceed with the above mentioned plan. Further management will depend upon hospital course.   CODE STATUS: Full code  TOTAL TIME TAKING CARE OF THIS PATIENT: 55 minutes.    Christel Mormon M.D on 08/04/2019 at 11:32 PM  [facility]   From 7 PM-7 AM, contact night-coverage [email or web address]  CC: Primary care physician; Donnie Coffin, MD   Note: This dictation was prepared with Dragon dictation along with smaller phrase technology. Any transcriptional errors that result from this process are unintentional.

## 2019-08-04 NOTE — ED Notes (Signed)
Report given to Annie RN 

## 2019-08-05 ENCOUNTER — Encounter: Payer: Self-pay | Admitting: Anesthesiology

## 2019-08-05 ENCOUNTER — Inpatient Hospital Stay: Payer: Medicare Other

## 2019-08-05 ENCOUNTER — Encounter
Admission: EM | Disposition: A | Discharge status: Home Health | Payer: Self-pay | Source: Home / Self Care | Attending: Internal Medicine

## 2019-08-05 ENCOUNTER — Inpatient Hospital Stay: Payer: Medicare Other | Admitting: Certified Registered"

## 2019-08-05 DIAGNOSIS — G809 Cerebral palsy, unspecified: Secondary | ICD-10-CM

## 2019-08-05 HISTORY — PX: ENDOSCOPIC RETROGRADE CHOLANGIOPANCREATOGRAPHY (ERCP) WITH PROPOFOL: SHX5810

## 2019-08-05 LAB — CBC
HCT: 41.3 % (ref 36.0–46.0)
Hemoglobin: 12.5 g/dL (ref 12.0–15.0)
MCH: 29.4 pg (ref 26.0–34.0)
MCHC: 30.3 g/dL (ref 30.0–36.0)
MCV: 97.2 fL (ref 80.0–100.0)
Platelets: 163 10*3/uL (ref 150–400)
RBC: 4.25 MIL/uL (ref 3.87–5.11)
RDW: 13.2 % (ref 11.5–15.5)
WBC: 12.4 10*3/uL — ABNORMAL HIGH (ref 4.0–10.5)
nRBC: 0 % (ref 0.0–0.2)

## 2019-08-05 LAB — COMPREHENSIVE METABOLIC PANEL
ALT: 139 U/L — ABNORMAL HIGH (ref 0–44)
AST: 92 U/L — ABNORMAL HIGH (ref 15–41)
Albumin: 2.7 g/dL — ABNORMAL LOW (ref 3.5–5.0)
Alkaline Phosphatase: 289 U/L — ABNORMAL HIGH (ref 38–126)
Anion gap: 9 (ref 5–15)
BUN: 7 mg/dL (ref 6–20)
CO2: 16 mmol/L — ABNORMAL LOW (ref 22–32)
Calcium: 7.6 mg/dL — ABNORMAL LOW (ref 8.9–10.3)
Chloride: 115 mmol/L — ABNORMAL HIGH (ref 98–111)
Creatinine, Ser: 0.67 mg/dL (ref 0.44–1.00)
GFR calc Af Amer: >60 mL/min (ref >60)
GFR calc non Af Amer: >60 mL/min (ref >60)
Glucose, Bld: 106 mg/dL — ABNORMAL HIGH (ref 70–99)
Potassium: 3.1 mmol/L — ABNORMAL LOW (ref 3.5–5.1)
Sodium: 140 mmol/L (ref 135–145)
Total Bilirubin: 6.4 mg/dL — ABNORMAL HIGH (ref 0.3–1.2)
Total Protein: 6.3 g/dL — ABNORMAL LOW (ref 6.5–8.1)

## 2019-08-05 LAB — HCG, QUANTITATIVE, PREGNANCY: hCG, Beta Chain, Quant, S: 2 m[IU]/mL (ref <5)

## 2019-08-05 LAB — HIV ANTIBODY (ROUTINE TESTING W REFLEX): HIV Screen 4th Generation wRfx: NONREACTIVE

## 2019-08-05 LAB — LIPASE, BLOOD: Lipase: 22 U/L (ref 11–51)

## 2019-08-05 LAB — GLUCOSE, CAPILLARY
Glucose-Capillary: 78 mg/dL (ref 70–99)
Glucose-Capillary: 112 mg/dL — ABNORMAL HIGH (ref 70–99)

## 2019-08-05 LAB — SARS CORONAVIRUS 2 (TAT 6-24 HRS): SARS Coronavirus 2: NEGATIVE

## 2019-08-05 LAB — MAGNESIUM: Magnesium: 1.8 mg/dL (ref 1.7–2.4)

## 2019-08-05 SURGERY — ENDOSCOPIC RETROGRADE CHOLANGIOPANCREATOGRAPHY (ERCP) WITH PROPOFOL
Anesthesia: General

## 2019-08-05 MED ORDER — SODIUM CHLORIDE 0.9 % IV BOLUS
500.0000 mL | Freq: Once | INTRAVENOUS | Status: AC
  Administered 2019-08-05: 05:00:00 500 mL via INTRAVENOUS

## 2019-08-05 MED ORDER — LEVETIRACETAM IN NACL 1000 MG/100ML IV SOLN
1000.0000 mg | Freq: Two times a day (BID) | INTRAVENOUS | Status: DC
  Administered 2019-08-05 (×2): 1000 mg via INTRAVENOUS
  Filled 2019-08-05 (×4): qty 100

## 2019-08-05 MED ORDER — GADOBUTROL 1 MMOL/ML IV SOLN
8.0000 mL | Freq: Once | INTRAVENOUS | Status: AC | PRN
  Administered 2019-08-05: 10:00:00 8 mL via INTRAVENOUS

## 2019-08-05 MED ORDER — INDOMETHACIN 50 MG RE SUPP
100.0000 mg | Freq: Once | RECTAL | Status: AC
  Administered 2019-08-05: 13:00:00 100 mg via RECTAL

## 2019-08-05 MED ORDER — TRAZODONE HCL 50 MG PO TABS: 25.0000 mg | ORAL_TABLET | Freq: Every evening | ORAL | Status: DC | PRN

## 2019-08-05 MED ORDER — POTASSIUM CHLORIDE 10 MEQ/100ML IV SOLN
10.0000 meq | INTRAVENOUS | Status: AC
  Administered 2019-08-05: 10 meq via INTRAVENOUS
  Filled 2019-08-05: qty 100

## 2019-08-05 MED ORDER — MIDAZOLAM HCL 2 MG/2ML IJ SOLN
INTRAMUSCULAR | Status: AC
  Filled 2019-08-05: qty 2

## 2019-08-05 MED ORDER — SODIUM CHLORIDE 0.9 % IV BOLUS
1000.0000 mL | Freq: Once | INTRAVENOUS | Status: AC
  Administered 2019-08-05: 02:00:00 1000 mL via INTRAVENOUS

## 2019-08-05 MED ORDER — SUGAMMADEX SODIUM 500 MG/5ML IV SOLN
INTRAVENOUS | Status: DC | PRN
  Administered 2019-08-05: 350 mg via INTRAVENOUS

## 2019-08-05 MED ORDER — ACETAMINOPHEN 325 MG PO TABS
650.0000 mg | ORAL_TABLET | Freq: Four times a day (QID) | ORAL | Status: DC | PRN
  Filled 2019-08-05: qty 2

## 2019-08-05 MED ORDER — PROPOFOL 500 MG/50ML IV EMUL
INTRAVENOUS | Status: AC
  Filled 2019-08-05: qty 50

## 2019-08-05 MED ORDER — LIDOCAINE HCL (PF) 2 % IJ SOLN
INTRAMUSCULAR | Status: AC
  Filled 2019-08-05: qty 10

## 2019-08-05 MED ORDER — PROPOFOL 10 MG/ML IV BOLUS
INTRAVENOUS | Status: DC | PRN
  Administered 2019-08-05: 150 mg via INTRAVENOUS

## 2019-08-05 MED ORDER — MAGNESIUM HYDROXIDE 400 MG/5ML PO SUSP: 30.0000 mL | Freq: Every day | ORAL | Status: DC | PRN

## 2019-08-05 MED ORDER — ONDANSETRON HCL 4 MG PO TABS: 4.0000 mg | ORAL_TABLET | Freq: Four times a day (QID) | ORAL | Status: DC | PRN

## 2019-08-05 MED ORDER — SODIUM CHLORIDE 0.9 % IV SOLN
1.0000 g | INTRAVENOUS | Status: DC
  Administered 2019-08-05: 07:00:00 1 g via INTRAVENOUS
  Filled 2019-08-05: qty 1
  Filled 2019-08-05: qty 10

## 2019-08-05 MED ORDER — OXYCODONE HCL 5 MG PO TABS
5.0000 mg | ORAL_TABLET | ORAL | Status: DC | PRN
  Administered 2019-08-06 – 2019-08-08 (×3): 5 mg via ORAL
  Filled 2019-08-05 (×3): qty 1

## 2019-08-05 MED ORDER — PHENYLEPHRINE HCL (PRESSORS) 10 MG/ML IV SOLN
INTRAVENOUS | Status: DC | PRN
  Administered 2019-08-05 (×2): 100 ug via INTRAVENOUS

## 2019-08-05 MED ORDER — ESMOLOL HCL 100 MG/10ML IV SOLN
INTRAVENOUS | Status: AC
  Filled 2019-08-05: qty 10

## 2019-08-05 MED ORDER — DEXAMETHASONE SODIUM PHOSPHATE 10 MG/ML IJ SOLN
INTRAMUSCULAR | Status: DC | PRN
  Administered 2019-08-05: 5 mg via INTRAVENOUS

## 2019-08-05 MED ORDER — SUGAMMADEX SODIUM 500 MG/5ML IV SOLN
INTRAVENOUS | Status: AC
  Filled 2019-08-05: qty 5

## 2019-08-05 MED ORDER — ROCURONIUM BROMIDE 100 MG/10ML IV SOLN
INTRAVENOUS | Status: DC | PRN
  Administered 2019-08-05: 30 mg via INTRAVENOUS

## 2019-08-05 MED ORDER — INDOMETHACIN 50 MG RE SUPP
RECTAL | Status: AC
  Administered 2019-08-05: 100 mg via RECTAL
  Filled 2019-08-05: qty 2

## 2019-08-05 MED ORDER — SUCCINYLCHOLINE CHLORIDE 20 MG/ML IJ SOLN
INTRAMUSCULAR | Status: AC
  Filled 2019-08-05: qty 1

## 2019-08-05 MED ORDER — POTASSIUM CHLORIDE 10 MEQ/100ML IV SOLN
10.0000 meq | INTRAVENOUS | Status: AC
  Administered 2019-08-05 (×3): 10 meq via INTRAVENOUS
  Filled 2019-08-05 (×2): qty 100

## 2019-08-05 MED ORDER — ENOXAPARIN SODIUM 40 MG/0.4ML ~~LOC~~ SOLN
40.0000 mg | SUBCUTANEOUS | Status: DC
  Administered 2019-08-06 – 2019-08-10 (×4): 40 mg via SUBCUTANEOUS
  Filled 2019-08-05 (×4): qty 0.4

## 2019-08-05 MED ORDER — SUCCINYLCHOLINE CHLORIDE 20 MG/ML IJ SOLN
INTRAMUSCULAR | Status: DC | PRN
  Administered 2019-08-05: 100 mg via INTRAVENOUS

## 2019-08-05 MED ORDER — ACETAMINOPHEN 650 MG RE SUPP
650.0000 mg | Freq: Four times a day (QID) | RECTAL | Status: DC | PRN
  Filled 2019-08-05 (×2): qty 1

## 2019-08-05 MED ORDER — ROCURONIUM BROMIDE 50 MG/5ML IV SOLN
INTRAVENOUS | Status: AC
  Filled 2019-08-05: qty 1

## 2019-08-05 MED ORDER — MIDAZOLAM HCL 2 MG/2ML IJ SOLN
INTRAMUSCULAR | Status: DC | PRN
  Administered 2019-08-05 (×2): 1 mg via INTRAVENOUS

## 2019-08-05 MED ORDER — SODIUM CHLORIDE 0.9 % IV SOLN
INTRAVENOUS | Status: DC
  Administered 2019-08-05 (×3): via INTRAVENOUS

## 2019-08-05 MED ORDER — ONDANSETRON HCL 4 MG/2ML IJ SOLN: 4.0000 mg | Freq: Four times a day (QID) | INTRAMUSCULAR | Status: DC | PRN

## 2019-08-05 MED ORDER — SODIUM CHLORIDE 0.9 % IV SOLN
3.0000 g | Freq: Four times a day (QID) | INTRAVENOUS | Status: DC
  Administered 2019-08-05 – 2019-08-09 (×16): 3 g via INTRAVENOUS
  Filled 2019-08-05 (×3): qty 3
  Filled 2019-08-05: qty 8
  Filled 2019-08-05 (×9): qty 3
  Filled 2019-08-05 (×2): qty 8
  Filled 2019-08-05 (×2): qty 3
  Filled 2019-08-05: qty 8
  Filled 2019-08-05: qty 3
  Filled 2019-08-05: qty 8

## 2019-08-05 MED ORDER — LACTATED RINGERS IV SOLN
INTRAVENOUS | Status: DC | PRN
  Administered 2019-08-05: 13:00:00 via INTRAVENOUS

## 2019-08-05 NOTE — Progress Notes (Addendum)
Pharmacy Electrolyte Monitoring Consult:  Pharmacy consulted to assist in monitoring and replacing electrolytes in this 48 y.o. female admitted on 08/04/2019 with Abdominal Pain   Labs:  Sodium (mmol/L)  Date Value  08/05/2019 140   Potassium (mmol/L)  Date Value  08/05/2019 3.1 (L)   Calcium (mg/dL)  Date Value  08/05/2019 7.6 (L)   Albumin (g/dL)  Date Value  08/05/2019 2.7 (L)    Assessment/Plan:  Pt currently NPO for procedure K 3.1 - will order KCl 10 mEq IV x4 runs  Check add on Mag level - 1.8, no supplementation needed at this time BMP in AM   Pharmacy will continue to follow.   Sandra Osborne L 08/05/2019 11:00 AM

## 2019-08-05 NOTE — Anesthesia Post-op Follow-up Note (Signed)
Anesthesia QCDR form completed.        

## 2019-08-05 NOTE — Anesthesia Procedure Notes (Signed)
Procedure Name: Intubation Performed by: Kelton Pillar, CRNA Pre-anesthesia Checklist: Patient identified, Suction available and Patient being monitored Patient Re-evaluated:Patient Re-evaluated prior to induction Oxygen Delivery Method: Circle system utilized Preoxygenation: Pre-oxygenation with 100% oxygen Induction Type: IV induction Ventilation: Mask ventilation without difficulty Laryngoscope Size: McGraph and 3 Grade View: Grade I Tube type: Oral Tube size: 7.0 mm Number of attempts: 1 Airway Equipment and Method: Stylet Placement Confirmation: ETT inserted through vocal cords under direct vision,  positive ETCO2,  breath sounds checked- equal and bilateral and CO2 detector Secured at: 20 cm Tube secured with: Tape Dental Injury: Teeth and Oropharynx as per pre-operative assessment

## 2019-08-05 NOTE — Consult Note (Signed)
GI Inpatient Consult Note  Reason for Consult: Choledocholithiasis   Attending Requesting Consult: Dr. Fritzi Mandes, MD  History of Present Illness: Sandra Osborne is a 48 y.o. female seen for evaluation of choledocholithiasis at the request of Dr. Fritzi Mandes. Pt has a PMH of cerebral palsy, T2DM, hyperlipidemia, HTN, Hx of seizure disorder, and obesity. She presented to the ED last night for acute onset of epigastric abd pain with associated GI symptoms of nausea and vomiting. There was no evidence of fever or chills. Patient is a poor historian and not able to contribute any information to the history. Her sister is her legal guardian. Labs in the ED were significant for AST 120, ALT 182, alk phos 350, and total bilirubin 5.4. WBC 11.0 and lipase was 40. RUQ US showed cholelithiasis with no features of acute cholecystitis, but CBD was dilated up to 19 mm. Patient is currently receiving IV antibiotics, IVF resuscitation, and IV pain medication. GI is consulted in the context of choledocholithiasis with evidence of acute cholestasis. Labs this morning show alk phos 289, total bilirubin 6.4, AST 92, ALT 139. Labs this morning showed WBC 12.4 and she did have fever of 100.4 degrees.  Other caregivers: Nelle Don - sister Larenda Reedy - sister   Last Colonoscopy:  Colonoscopy 12/2016 - Internal hemorrhoids, exam otherwise without abnormality   Last Endoscopy:    Past Medical History:  Past Medical History:  Diagnosis Date  . Cerebral palsy ([place])   . Diabetes ([place])   . Dyslipidemia   . Dysrhythmia    tachycardia  . History of heart disease   . Hypertension   . Intermittent explosive disorder   . Intractable seizures ([place])   . Mental retardation   . Obesity     Problem List: Patient Active Problem List   Diagnosis Date Noted  . Choledocholithiasis 08/04/2019  . Internal hemorrhoid, bleeding   . 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 Abnormality of gait  12/15/2014  . Intellectual disability 07/26/2013  . Other forms of epilepsy and recurrent seizures without mention of intractable epilepsy 07/26/2013    Past Surgical History: Past Surgical History:  Procedure Laterality Date  . COLONOSCOPY WITH PROPOFOL N/A 01/23/2017   Procedure: COLONOSCOPY WITH PROPOFOL;  Surgeon: Manya Silvas, MD;  Location: [facility] ENDOSCOPY;  Service: Endoscopy;  Laterality: N/A;  . dental extractions      Allergies: Allergies  Allergen Reactions  . Depakote [Divalproex Sodium] Other (See Comments)  . Primidone Other (See Comments)    Home Medications: Medications Prior to Admission  Medication Sig Dispense Refill Last Dose  . calcium carbonate (OS-CAL) 600 MG TABS tablet Take 600 mg by mouth 2 (two) times daily with a meal. Reported on 01/01/2016   unknown at unknown  . cetirizine (ZYRTEC) 10 MG tablet Take 10 mg by mouth daily.   unknown at unknown  . docusate sodium (COLACE) 100 MG capsule Take 100 mg by mouth daily.   unknown at unknown  . fexofenadine (ALLEGRA) 180 MG tablet Take 180 mg by mouth daily as needed.   prn at prn  . KEPPRA 500 MG tablet TAKE 2 TABLETS BY MOUTH TWICE DAILY 360 tablet 0 unknown at unknown  . lovastatin (MEVACOR) 20 MG tablet Take 20 mg by mouth at bedtime.   unknown at unknown  . medroxyPROGESTERone (DEPO-PROVERA) 150 MG/ML injection Inject 150 mg into the muscle every 3 (three) months.   unknown at unknown  . metoprolol (LOPRESSOR) 50 MG tablet Take  50 mg by mouth 2 (two) times daily.    unknown at unknown  . QUEtiapine (SEROQUEL) 200 MG tablet Take 200 mg by mouth 2 (two) times daily.   unknown at unknown  . quinapril (ACCUPRIL) 10 MG tablet Take 10 mg by mouth at bedtime.   unknown at unknown  . ZONEGRAN 100 MG capsule TAKE 3 CAPSULES BY MOUTH TWICE DAILY 540 capsule 0 unknown at unknown   Home medication reconciliation was completed with the patient.   Scheduled Inpatient Medications:   . calcium-vitamin D  1 tablet Oral  BID WC  . docusate sodium  100 mg Oral Daily  . enoxaparin (LOVENOX) injection  40 mg Subcutaneous Q24H  . levETIRAcetam  1,000 mg Oral BID  . lisinopril  10 mg Oral Daily  . loratadine  10 mg Oral Daily  . metoprolol tartrate  50 mg Oral BID  . QUEtiapine  200 mg Oral BID  . zonisamide  300 mg Oral BID    Continuous Inpatient Infusions:   . sodium chloride 100 mL/hr at 08/05/19 0459  . cefTRIAXone (ROCEPHIN)  IV 1 g (08/05/19 0654)    PRN Inpatient Medications:  acetaminophen **OR** acetaminophen, magnesium hydroxide, morphine injection, ondansetron **OR** ondansetron (ZOFRAN) IV, oxyCODONE, traZODone  Family History: family history includes Brain cancer in her mother; Lung cancer in her mother.  The patient's family history is negative for inflammatory bowel disorders, GI malignancy, or solid organ transplantation.  Social History:   reports that she has never smoked. She has never used smokeless tobacco. She reports that she does not drink alcohol or use drugs. The patient denies ETOH, tobacco, or drug use.   Review of Systems:  Unable to obtain due to patient's baseline mental status   Physical Examination: BP (!) 95/58 (BP Location: Left Arm)   Pulse (!) 110   Temp (!) 100.4 F (38 C) (Oral)   Resp 18   Ht _0  (1.626 m)   Wt 81.6 kg   SpO2 100%   BMI 30.90 kg/m   Pleasant, female resting in hospital bed. Gen: NAD, alert and oriented x 4 HEENT: PEERLA, EOMI, Neck: supple, no JVD or thyromegaly Chest: CTA bilaterally, no wheezes, crackles, or other adventitious sounds CV: RRR, no m/g/c/r Abd: soft, , ND, +BS in all four quadrants; mildly tender to light and deep palpation in RUQ and epigastric region no HSM, guarding, ridigity, or rebound tenderness Ext: no edema, well perfused with 2+ pulses, Skin: no rash or lesions noted Lymph: no LAD  Data: Lab Results  Component Value Date   WBC 12.4 (H) 08/05/2019   HGB 12.5 08/05/2019   HCT 41.3 08/05/2019   MCV  97.2 08/05/2019   PLT 163 08/05/2019   Recent Labs  Lab 08/04/19 1752 08/05/19 0415  HGB 14.3 12.5   Lab Results  Component Value Date   NA 140 08/05/2019   K 3.1 (L) 08/05/2019   CL 115 (H) 08/05/2019   CO2 16 (L) 08/05/2019   BUN 7 08/05/2019   CREATININE 0.67 08/05/2019   Lab Results  Component Value Date   ALT 139 (H) 08/05/2019   AST 92 (H) 08/05/2019   ALKPHOS 289 (H) 08/05/2019   BILITOT 6.4 (H) 08/05/2019   No results for input(s): APTT, INR, PTT in the last 168 hours. Assessment/Plan:  48 y/o AA female with a PMH of cerebral palsy, T2DM, hyperlipidemia, HTN, Hx of seizure disorder, and obesity presented to the ED for acute onset epigastric abd pain with  associated nausea and vomiting  1. Choledocholithiasis with evidence of extrahepatic biliary obstruction 2. Acute cholangitis - Fever of 100.4 degrees with RUQ abdominal pain and elevated bilirubin 2. Cerebral palsy - poor historian. Sister is legal guardian.  -Patient's clinical presentation, labs, and imaging are consistent with acute choledocholithiasis with evidence of acute cholestasis (elevated alk phos to 289 and total bilirubin 6.4) with transaminitis.  -RUQ US showed cholelithiasis with dilated CBD to 19 mm -Agree with IV antibiotics, IV pain medication, and IVF resuscitation -Advise emergent ERCP with Dr. Allen Norris with stone extraction as next step in the plan of care -I have called and spoken with patient's legal guardian - her sister, Henslee Lottman. We discussed potential I reviewed the risks (including bleeding, perforation, infection, anesthesia complications, cardiac/respiratory complications, post-ERCP pancreatitis), benefits and alternatives of ERCP. Patient consents to proceed.  -Plan for ERCP this afternoon with Dr. Allen Norris -Surgery has been consulted for likely cholecystectomy this hospital admission depending on her clinical progress    Thank you for the consult. Please call with questions or  concerns.  Reeves Forth [facility] [phone] (Cell)

## 2019-08-05 NOTE — Consult Note (Addendum)
Patient ID: Sandra Osborne, female   DOB: 04-12-1971, 48 y.o.   MRN: [id number]  HPI Sandra Osborne is a 48 y.o. female seen In consultation at thew request of Dr. Servando Snare for choledoco lithiasis. Hx of cerebral palsy, Dm, HTBN and seizure disorder. She presented to the ER yesterday for abd pain, nausea and vomiting. W/U revealed enlarged CBD, GS. MRCP and U./S personally reviewed,enlarged CBD choledocholithiasis, ? Pancreatitis.  She did have bili 6.4 AP 289 AST 92 ALT 139.wbc 12.4 She is unable to communicate and answer question due to her mental condition. Sister is her Legal guardian She was febrile today  HPI  Past Medical History:  Diagnosis Date  . Cerebral palsy (HCC)   . Diabetes (HCC)   . Dyslipidemia   . Dysrhythmia    tachycardia  . History of heart disease   . Hypertension   . Intermittent explosive disorder   . Intractable seizures (HCC)   . Mental retardation   . Obesity     Past Surgical History:  Procedure Laterality Date  . COLONOSCOPY WITH PROPOFOL N/A 01/23/2017   Procedure: COLONOSCOPY WITH PROPOFOL;  Surgeon: Scot Jun, MD;  Location: [facility] ENDOSCOPY;  Service: Endoscopy;  Laterality: N/A;  . dental extractions      Family History  Problem Relation Age of Onset  . Lung cancer Mother   . Brain cancer Mother   . Breast cancer Neg Hx     Social History Social History   Tobacco Use  . Smoking status: Never Smoker  . Smokeless tobacco: Never Used  Substance Use Topics  . Alcohol use: No  . Drug use: No    Allergies  Allergen Reactions  . Depakote [Divalproex Sodium] Other (See Comments)  . Primidone Other (See Comments)    Current Facility-Administered Medications  Medication Dose Route Frequency Provider Last Rate Last Dose  . 0.9 %  sodium chloride infusion   Intravenous Continuous Enedina Finner, MD 150 mL/hr at 08/05/19 1058    . [MAR Hold] acetaminophen (TYLENOL) tablet 650 mg  650 mg Oral Q6H PRN Mansy, Vernetta Honey, MD       Or  . Mitzi Hansen  Hold] acetaminophen (TYLENOL) suppository 650 mg  650 mg Rectal Q6H PRN Mansy, Vernetta Honey, MD      . Mitzi Hansen Hold] Ampicillin-Sulbactam (UNASYN) 3 g in sodium chloride 0.9 % 100 mL IVPB  3 g Intravenous Q6H Enedina Finner, MD 200 mL/hr at 08/05/19 1202 3 g at 08/05/19 1202  . [MAR Hold] calcium-vitamin D (OSCAL WITH D) 500-200 MG-UNIT per tablet 1 tablet  1 tablet Oral BID WC Mansy, Vernetta Honey, MD      . Mitzi Hansen Hold] docusate sodium (COLACE) capsule 100 mg  100 mg Oral Daily Mansy, Jan A, MD      . Mitzi Hansen Hold] enoxaparin (LOVENOX) injection 40 mg  40 mg Subcutaneous Q24H Mansy, Vernetta Honey, MD   Stopped at 08/05/19 1012  . indomethacin (INDOCIN) 50 MG suppository 100 mg  100 mg Rectal Once Midge Minium, MD      . indomethacin (INDOCIN) 50 MG suppository           . [MAR Hold] levETIRAcetam (KEPPRA) IVPB 1000 mg/100 mL premix  1,000 mg Intravenous Q12H Enedina Finner, MD 400 mL/hr at 08/05/19 1123 1,000 mg at 08/05/19 1123  . [MAR Hold] lisinopril (ZESTRIL) tablet 10 mg  10 mg Oral Daily Mansy, Jan A, MD      . Mitzi Hansen Hold] loratadine (CLARITIN) tablet 10 mg  10 mg Oral Daily Mansy, Jan A, MD      . Doug Sou Hold] magnesium hydroxide (MILK OF MAGNESIA) suspension 30 mL  30 mL Oral Daily PRN Mansy, Arvella Merles, MD      . Doug Sou Hold] metoprolol tartrate (LOPRESSOR) tablet 50 mg  50 mg Oral BID Mansy, Jan A, MD      . Doug Sou Hold] morphine 2 MG/ML injection 2 mg  2 mg Intravenous Q3H PRN Merlyn Lot, MD   2 mg at 08/04/19 2141  . [MAR Hold] ondansetron (ZOFRAN) tablet 4 mg  4 mg Oral Q6H PRN Mansy, Arvella Merles, MD       Or  . Doug Sou Hold] ondansetron [facility]) injection 4 mg  4 mg Intravenous Q6H PRN Mansy, Arvella Merles, MD      . Mikaela.Ping Hold] oxyCODONE (Oxy IR/ROXICODONE) immediate release tablet 5 mg  5 mg Oral Q4H PRN Mansy, Arvella Merles, MD      . Mikaela.Ping Hold] potassium chloride 10 mEq in 100 mL IVPB  10 mEq Intravenous Q1 Hr x 4 Rocky Morel, RPH 100 mL/hr at 08/05/19 1247 10 mEq at 08/05/19 1247  . [MAR Hold] QUEtiapine (SEROQUEL) tablet 200 mg  200 mg Oral  BID Mansy, Arvella Merles, MD      . Doug Sou Hold] traZODone (DESYREL) tablet 25 mg  25 mg Oral QHS PRN Mansy, Arvella Merles, MD      . Doug Sou Hold] zonisamide (ZONEGRAN) capsule 300 mg  300 mg Oral BID Mansy, Jan A, MD         Review of Systems Unable to perform due to mentation  Physical Exam Blood pressure 95/64, pulse (!) 112, temperature 100.2 F (37.9 C), temperature source Oral, resp. rate 18, height 5\' 4"  (1.626 m), weight 81.6 kg, SpO2 100 %. CONSTITUTIONAL: NAD, She has cognitive impairment, answer YES to everything EYES: Pupils are equal, round, , Sclera are non-icteric. EARS, NOSE, MOUTH AND THROAT: The oropharynx is clear. The oral mucosa is pink and moist. Hearing is intact to voice. LYMPH NODES:  Lymph nodes in the neck are normal. RESPIRATORY:  Lungs are clear. There is normal respiratory effort, with equal breath sounds bilaterally, and without pathologic use of accessory muscles. CARDIOVASCULAR: Heart is regular without murmurs, gallops, or rubs. GI: The abdomen is  soft, nontender, and nondistended. There are no palpable masses. There is no hepatosplenomegaly. There are normal bowel sounds in all quadrants. GU: Rectal deferred.   MUSCULOSKELETAL: Normal muscle strength and tone. No cyanosis or edema.   SKIN: Turgor is good and there are no pathologic skin lesions or ulcers. NEUROLOGIC: Motor and sensation is grossly normal. Cranial nerves are grossly intact. PSYCH:    Data Reviewed  I have personally reviewed the patient's imaging, laboratory findings and medical records.    Assessment/Plan 48 year old female with signs and symptoms consistent with choledocholithiasis early cholangitis.  She will need an ERCP .  Dr.Wohl performed the ERCP later today.  She will need cholecystectomy during this admission.  We will tentatively place her on schedule for Monday.  I will talk to her family about the need for cholecystectomy .  I do think she will be a good candidate for laparoscopic  cholecystectomy.  There is no need for emergent surgical intervention at this time.    Caroleen Hamman, MD FACS General Surgeon 08/05/2019, 12:58 PM

## 2019-08-05 NOTE — TOC Initial Note (Signed)
Transition of Care [facility]) - Initial/Assessment Note    Patient Details  Name: Sandra Osborne MRN: [id number] Date of Birth: 10-22-1970  Transition of Care [facility]) CM/SW Contact:    Sandra Osborne, Sandra Osborne Phone Number: [phone]  08/05/2019, 5:26 PM  Clinical Narrative: Per chart patient has a legal guardian. Clinical Education officer, museum (CSW) contacted patient's sister Sandra Osborne to complete assessment. Per Sandra Osborne patient lives with her in [place] and she is patient's guardian. Per sister patient's granddaughter takes her to her home in [place] on the weekends. Per sister patient has PCS services through medicaid 3 days per week 2 pm to 6 pm. Sister reported that patient walks without an assistive device at baseline. Per sister she wants to bring patient home when she leaves [facility]. CSW will continue to follow and assist as needed.         Expected Discharge Plan: Home/Self Care Barriers to Discharge: Continued Medical Work up   Patient Goals and CMS Choice        Expected Discharge Plan and Services Expected Discharge Plan: Home/Self Care In-house Referral: Clinical Social Work     Living arrangements for the past 2 months: Single Family Home                                      Prior Living Arrangements/Services Living arrangements for the past 2 months: Single Family Home Lives with:: Siblings Patient language and need for interpreter reviewed:: Yes        Need for Family Participation in Patient Care: Yes (Comment) Care giver support system in place?: Yes (comment)   Criminal Activity/Legal Involvement Pertinent to Current Situation/Hospitalization: No - Comment as needed  Activities of Daily Living      Permission Sought/Granted                  Emotional Assessment Appearance:: Appears stated age     Orientation: : Fluctuating Orientation (Suspected and/or reported Sundowners), Oriented to Self, Oriented to Place Alcohol / Substance Use: Not  Applicable Psych Involvement: No (comment)  Admission diagnosis:  Pain [R52] RUQ pain [R10.11] Total bilirubin, elevated [R17] Patient Active Problem List   Diagnosis Date Noted  . Choledocholithiasis 08/04/2019  . Internal hemorrhoid, bleeding   . 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 Abnormality of gait 12/15/2014  . Intellectual disability 07/26/2013  . Other forms of epilepsy and recurrent seizures without mention of intractable epilepsy 07/26/2013   PCP:  Donnie Coffin, MD Pharmacy:   [place], [place] [address] Phone: [phone] Fax: [phone]     Social Determinants of Health (SDOH) Interventions    Readmission Risk Interventions No flowsheet data found.

## 2019-08-05 NOTE — ED Notes (Signed)
.. ED TO INPATIENT HANDOFF REPORT  ED Nurse Name and Phone #: Deneise Lever [phone]  M Name/Age/Gender Sandra Osborne 48 y.o. female Room/Bed: ED12A/ED12A  Code Status   Code Status: Full Code  Home/SNF/Other Home Patient oriented to: self and place Is this baseline? Yes   Triage Complete: Triage complete  Chief Complaint Abd pain  Triage Note Pt family reports pt with abdominal cramps for a couple of days. Family denies other sx's. Family reports pt with decreased appetite and says "owe owe" to her stomach.    Allergies Allergies  Allergen Reactions  . Depakote [Divalproex Sodium] Other (See Comments)  . Primidone Other (See Comments)    Level of Care/Admitting Diagnosis ED Disposition    ED Disposition Condition [place] [facility] Area: [place] [[id number]]  Level of Care: Med-Surg [16]  Covid Evaluation: Asymptomatic Screening Protocol (No Symptoms)  Diagnosis: Choledocholithiasis [841324]  Admitting Physician: Christel Mormon [[id number]]  Attending Physician: Christel Mormon [[id number]]  Estimated length of stay: past midnight tomorrow  Certification:: I certify this patient will need inpatient services for at least 2 midnights  PT Class (Do Not Modify): Inpatient [101]  PT Acc Code (Do Not Modify): Private [1]       B Medical/Surgery History Past Medical History:  Diagnosis Date  . Cerebral palsy ([place])   . Diabetes ([place])   . Dyslipidemia   . Dysrhythmia    tachycardia  . History of heart disease   . Hypertension   . Intermittent explosive disorder   . Intractable seizures ([place])   . Mental retardation   . Obesity    Past Surgical History:  Procedure Laterality Date  . COLONOSCOPY WITH PROPOFOL N/A 01/23/2017   Procedure: COLONOSCOPY WITH PROPOFOL;  Surgeon: Manya Silvas, MD;  Location: [facility] ENDOSCOPY;  Service: Endoscopy;  Laterality: N/A;  . dental extractions       A IV Location/Drains/Wounds Patient Lines/Drains/Airways  Status   Active Line/Drains/Airways    Name:   Placement date:   Placement time:   Site:   Days:   Peripheral IV 08/04/19 Right Hand   08/04/19    2139    Hand   1          Intake/Output Last 24 hours  Intake/Output Summary (Last 24 hours) at 08/05/2019 0303 Last data filed at 08/05/2019 0227 Gross per 24 hour  Intake 1550 ml  Output -  Net 1550 ml    Labs/Imaging Results for orders placed or performed during the hospital encounter of 08/04/19 (from the past 48 hour(s))  Lipase, blood     Status: None   Collection Time: 08/04/19  5:52 PM  Result Value Ref Range   Lipase 40 11 - 51 U/L    Comment: Performed at [facility], [address] [place], [place] [address]  Comprehensive metabolic panel     Status: Abnormal   Collection Time: 08/04/19  5:52 PM  Result Value Ref Range   Sodium 141 135 - 145 mmol/L   Potassium 3.8 3.5 - 5.1 mmol/L   Chloride 110 98 - 111 mmol/L   CO2 19 (L) 22 - 32 mmol/L   Glucose, Bld 127 (H) 70 - 99 mg/dL   BUN QUANTITY NOT SUFFICIENT, UNABLE TO PERFORM TEST 6 - 20 mg/dL    Comment: CALLED TO STEPHANIE 08/04/19 1822 KLW   Creatinine, Ser <0.30 (L) 0.44 - 1.00 mg/dL   Calcium 8.9 8.9 - 10.3 mg/dL   Total  Protein 7.5 6.5 - 8.1 g/dL   Albumin 3.1 (L) 3.5 - 5.0 g/dL   AST 702 (H) 15 - 41 U/L   ALT 182 (H) 0 - 44 U/L   Alkaline Phosphatase 350 (H) 38 - 126 U/L   Total Bilirubin 5.4 (H) 0.3 - 1.2 mg/dL   GFR calc non Af Amer NOT CALCULATED >60 mL/min   GFR calc Af Amer NOT CALCULATED >60 mL/min   Anion gap 12 5 - 15    Comment: Performed at [facility], [address] [place], [place] [address]  CBC     Status: Abnormal   Collection Time: 08/04/19  5:52 PM  Result Value Ref Range   WBC 11.0 (H) 4.0 - 10.5 K/uL   RBC 4.95 3.87 - 5.11 MIL/uL   Hemoglobin 14.3 12.0 - 15.0 g/dL   HCT 88.5 02.7 - 74.1 %   MCV 91.3 80.0 - 100.0 fL   MCH 28.9 26.0 - 34.0 pg   MCHC 31.6 30.0 - 36.0 g/dL   RDW 28.7 86.7 - 67.2 %   Platelets 163  150 - 400 K/uL   nRBC 0.0 0.0 - 0.2 %    Comment: Performed at [facility], [address] [place], [place] [address]   US Abdomen Limited Ruq  Result Date: 08/04/2019 CLINICAL DATA:  Right upper quadrant pain EXAM: ULTRASOUND ABDOMEN LIMITED RIGHT UPPER QUADRANT COMPARISON:  None. FINDINGS: Gallbladder: Diffuse shadowing within the gallbladder presumably due to a large shadowing stone or multiple shadowing stones. Negative sonographic Murphy. Normal wall thickness of 2.9 mm Common bile duct: Diameter: 19.4 mm Liver: No focal lesion identified. Within normal limits in parenchymal echogenicity. Portal vein is patent on color Doppler imaging with normal direction of blood flow towards the liver. Other: None. IMPRESSION: 1. Shadowing at the gallbladder fossa presumably due to large or multiple stones in the gallbladder. No other features to suggest an acute cholecystitis 2. Dilated common bile duct measuring up to 19 mm. Recommend correlation with LFTs and consider further evaluation with MRCP Electronically Signed   By: Jasmine Pang M.D.   On: 08/04/2019 21:38    Pending Labs Unresulted Labs (From admission, onward)    Start     Ordered   08/05/19 0500  Comprehensive metabolic panel  Tomorrow morning,   STAT     08/05/19 0219   08/05/19 0500  CBC  Tomorrow morning,   STAT     08/05/19 0219   08/05/19 0206  HIV Antibody (routine testing w rflx)  (HIV Antibody (Routine testing w reflex) panel)  Once,   STAT     08/05/19 0219   08/04/19 2225  hCG, quantitative, pregnancy  Once,   STAT     08/04/19 2225   08/04/19 2202  SARS CORONAVIRUS 2 (TAT 6-24 HRS) Nasopharyngeal Nasopharyngeal Swab  (Asymptomatic/Tier 2 Patients Labs)  ONCE - STAT,   STAT    Question Answer Comment  Is this test for diagnosis or screening Screening   Symptomatic for COVID-19 as defined by CDC No   Hospitalized for COVID-19 No   Admitted to ICU for COVID-19 No   Previously tested for COVID-19 No   Resident in a  congregate (group) care setting Unknown   Employed in healthcare setting Unknown   Pregnant Unknown      08/04/19 2201   08/04/19 1710  Urinalysis, Complete w Microscopic  ONCE - STAT,   STAT     08/04/19 1709  Vitals/Pain Today's Vitals   08/04/19 1711 08/05/19 0047 08/05/19 0100 08/05/19 0130  BP: (!) 132/107 (!) 95/58 (!) 100/53   Pulse: (!) 57     Resp: 18     Temp: 97.9 F (36.6 C)     TempSrc: Oral     SpO2: 100%     Weight:      Height:      PainSc:    Asleep    Isolation Precautions No active isolations  Medications Medications  morphine 2 MG/ML injection 2 mg (2 mg Intravenous Given 08/04/19 2141)  metoprolol tartrate (LOPRESSOR) tablet 50 mg (50 mg Oral Not Given 08/05/19 0049)  lisinopril (ZESTRIL) tablet 10 mg (has no administration in time range)  QUEtiapine (SEROQUEL) tablet 200 mg (has no administration in time range)  docusate sodium (COLACE) capsule 100 mg (has no administration in time range)  levETIRAcetam (KEPPRA) tablet 1,000 mg (has no administration in time range)  zonisamide (ZONEGRAN) capsule 300 mg (has no administration in time range)  calcium carbonate (OS-CAL) tablet 600 mg (has no administration in time range)  loratadine (CLARITIN) tablet 10 mg (has no administration in time range)  enoxaparin (LOVENOX) injection 40 mg (has no administration in time range)  0.9 %  sodium chloride infusion (has no administration in time range)  acetaminophen (TYLENOL) tablet 650 mg (has no administration in time range)    Or  acetaminophen (TYLENOL) suppository 650 mg (has no administration in time range)  oxyCODONE (Oxy IR/ROXICODONE) immediate release tablet 5 mg (has no administration in time range)  traZODone (DESYREL) tablet 25 mg (has no administration in time range)  magnesium hydroxide (MILK OF MAGNESIA) suspension 30 mL (has no administration in time range)  ondansetron (ZOFRAN) tablet 4 mg (has no administration in time range)    Or   ondansetron (ZOFRAN) injection 4 mg (has no administration in time range)  cefTRIAXone (ROCEPHIN) 1 g in sodium chloride 0.9 % 100 mL IVPB (has no administration in time range)  ondansetron (ZOFRAN) injection 4 mg (4 mg Intravenous Given 08/04/19 2141)  sodium chloride 0.9 % bolus 500 mL (0 mLs Intravenous Stopped 08/05/19 0130)  0.9 %  sodium chloride infusion ( Intravenous Stopped 08/05/19 0227)  piperacillin-tazobactam (ZOSYN) IVPB 3.375 g (0 g Intravenous Stopped 08/05/19 0130)  sodium chloride 0.9 % bolus 1,000 mL (1,000 mLs Intravenous New Bag/Given 08/05/19 0227)    Mobility non-ambulatory High fall risk   Focused Assessments Gi   R Recommendations: See Admitting Provider Note  Report given to:   Additional Notes:

## 2019-08-05 NOTE — Transfer of Care (Signed)
Immediate Anesthesia Transfer of Care Note  Patient: Sandra Osborne  Procedure(s) Performed: ENDOSCOPIC RETROGRADE CHOLANGIOPANCREATOGRAPHY (ERCP) WITH PROPOFOL (N/A )  Patient Location: PACU  Anesthesia Type:General  Level of Consciousness: awake, alert  and patient cooperative  Airway & Oxygen Therapy: Patient Spontanous Breathing and Patient connected to face mask oxygen  Post-op Assessment: Report given to RN and Post -op Vital signs reviewed and stable  Post vital signs: Reviewed and stable  Last Vitals:  Vitals Value Taken Time  BP 102/76 08/05/19 1405  Temp 36.8 C 08/05/19 1405  Pulse 114 08/05/19 1405  Resp 20 08/05/19 1411  SpO2 100 % 08/05/19 1405  Vitals shown include unvalidated device data.  Last Pain:  Vitals:   08/05/19 1256  TempSrc: Temporal  PainSc: 5          Complications: No apparent anesthesia complications

## 2019-08-05 NOTE — Progress Notes (Signed)
[facility] NAME: Sandra Osborne    MR#:  [id number]  DATE OF BIRTH:  Oct 30, 1970  SUBJECTIVE:   Patient just got back from G.I./endoscopy procedure. She has baseline mental retardation/cerebral palsy. No family in the room. Appears to be comfortable. REVIEW OF SYSTEMS:   Review of Systems  Unable to perform ROS: Psychiatric disorder   Tolerating Diet: Tolerating PT:   DRUG ALLERGIES:   Allergies  Allergen Reactions  . Depakote [Divalproex Sodium] Other (See Comments)  . Primidone Other (See Comments)    VITALS:  Blood pressure 109/77, pulse 75, temperature 98.3 F (36.8 C), temperature source Oral, resp. rate 20, height 5\' 4"  (1.626 m), weight 81.6 kg, SpO2 100 %.  PHYSICAL EXAMINATION:   Physical Exam limited pt has MR  GENERAL:  48 y.o.-year-old patient lying in the bed with no acute distress. obese EYES: Pupils equal, round, reactive to light and accommodation. No scleral icterus. Extraocular muscles intact.  HEENT: Head atraumatic, normocephalic. Oropharynx and nasopharynx clear.  NECK:  Supple, no jugular venous distention. No thyroid enlargement, no tenderness.  LUNGS: Normal breath sounds bilaterally, no wheezing, rales, rhonchi. No use of accessory muscles of respiration.  CARDIOVASCULAR: S1, S2 normal. No murmurs, rubs, or gallops.  ABDOMEN: Soft, nontender, nondistended. Bowel sounds present. No organomegaly or mass.  EXTREMITIES: No cyanosis, clubbing or edema b/l.     PSYCHIATRIC:  patient is alert and and at baseline has mental retardation with cerebral palsy   LABORATORY PANEL:  CBC Recent Labs  Lab 08/05/19 0415  WBC 12.4*  HGB 12.5  HCT 41.3  PLT 163    Chemistries  Recent Labs  Lab 08/05/19 0415  NA 140  K 3.1*  CL 115*  CO2 16*  GLUCOSE 106*  BUN 7  CREATININE 0.67  CALCIUM 7.6*  MG 1.8  AST 92*  ALT 139*  ALKPHOS 289*  BILITOT 6.4*   Cardiac Enzymes No results for  input(s): TROPONINI in the last 168 hours. RADIOLOGY:  Mr 3d Recon At Scanner  Result Date: 08/05/2019 CLINICAL DATA:  Evaluate gallstones and dilated common bile duct. EXAM: MRI ABDOMEN WITHOUT AND WITH CONTRAST (INCLUDING MRCP) TECHNIQUE: Multiplanar multisequence MR imaging of the abdomen was performed both before and after the administration of intravenous contrast. Heavily T2-weighted images of the biliary and pancreatic ducts were obtained, and three-dimensional MRCP images were rendered by post processing. CONTRAST:  9mL GADAVIST GADOBUTROL 1 MMOL/ML IV SOLN COMPARISON:  Ultrasound 08/04/2019 FINDINGS: Lower chest: No pleural effusion. Hepatobiliary: There is marked diffuse intrahepatic and common bile duct dilatation. The common bile duct measures 2.1 cm in maximum diameter. Debris is seen layering within the distal common bile duct. At the ampulla there is an impacted stone which measures 1.1 cm, image 32/10 and image 17/3. Multiple stones identified within the gallbladder these measure up to 1.8 cm. Diffuse low signal throughout the wall of the gallbladder is noted which may reflect gallbladder wall calcifications. Pancreas: There is diffuse edema of the pancreas compatible with acute pancreatitis. The main pancreatic duct is increased in caliber measuring up to 6 mm. No evidence of pancreatic necrosis or pseudocyst formation identified at this time. Spleen:  Within normal limits in size and appearance. Adrenals/Urinary Tract: Normal appearance of the adrenal glands. The kidneys are unremarkable. No mass or hydronephrosis. Stomach/Bowel: Visualized portions within the abdomen are unremarkable. Vascular/Lymphatic: No pathologically enlarged lymph nodes identified. No abdominal aortic aneurysm demonstrated. Other: There is a small  volume of free fluid identified within the right upper quadrant of the abdomen. Musculoskeletal: No suspicious bone lesions identified. IMPRESSION: 1. Acute pancreatitis. No  findings to suggest pseudocyst or pancreatic necrosis. 2. Gallstones. Marked dilatation of the intrahepatic bile ducts and common bile ducts noted. Within the distal common bile duct there is an impacted stone at the level of the ampulla measuring 1.1 cm. 3. These results will be called to the ordering clinician or representative by the Radiologist Assistant, and communication documented in the PACS or zVision Dashboard. Electronically Signed   By: Signa Kellaylor  Stroud M.D.   On: 08/05/2019 10:02   Dg C-arm 1-60 Min-no Report  Result Date: 08/05/2019 Fluoroscopy was utilized by the requesting physician.  No radiographic interpretation.   Mr Abdomen Mrcp Vivien RossettiW Wo Contast  Result Date: 08/05/2019 CLINICAL DATA:  Evaluate gallstones and dilated common bile duct. EXAM: MRI ABDOMEN WITHOUT AND WITH CONTRAST (INCLUDING MRCP) TECHNIQUE: Multiplanar multisequence MR imaging of the abdomen was performed both before and after the administration of intravenous contrast. Heavily T2-weighted images of the biliary and pancreatic ducts were obtained, and three-dimensional MRCP images were rendered by post processing. CONTRAST:  8mL GADAVIST GADOBUTROL 1 MMOL/ML IV SOLN COMPARISON:  Ultrasound 08/04/2019 FINDINGS: Lower chest: No pleural effusion. Hepatobiliary: There is marked diffuse intrahepatic and common bile duct dilatation. The common bile duct measures 2.1 cm in maximum diameter. Debris is seen layering within the distal common bile duct. At the ampulla there is an impacted stone which measures 1.1 cm, image 32/10 and image 17/3. Multiple stones identified within the gallbladder these measure up to 1.8 cm. Diffuse low signal throughout the wall of the gallbladder is noted which may reflect gallbladder wall calcifications. Pancreas: There is diffuse edema of the pancreas compatible with acute pancreatitis. The main pancreatic duct is increased in caliber measuring up to 6 mm. No evidence of pancreatic necrosis or pseudocyst  formation identified at this time. Spleen:  Within normal limits in size and appearance. Adrenals/Urinary Tract: Normal appearance of the adrenal glands. The kidneys are unremarkable. No mass or hydronephrosis. Stomach/Bowel: Visualized portions within the abdomen are unremarkable. Vascular/Lymphatic: No pathologically enlarged lymph nodes identified. No abdominal aortic aneurysm demonstrated. Other: There is a small volume of free fluid identified within the right upper quadrant of the abdomen. Musculoskeletal: No suspicious bone lesions identified. IMPRESSION: 1. Acute pancreatitis. No findings to suggest pseudocyst or pancreatic necrosis. 2. Gallstones. Marked dilatation of the intrahepatic bile ducts and common bile ducts noted. Within the distal common bile duct there is an impacted stone at the level of the ampulla measuring 1.1 cm. 3. These results will be called to the ordering clinician or representative by the Radiologist Assistant, and communication documented in the PACS or zVision Dashboard. Electronically Signed   By: Signa Kellaylor  Stroud M.D.   On: 08/05/2019 10:02   [place] Abdomen Limited Ruq  Result Date: 08/04/2019 CLINICAL DATA:  Right upper quadrant pain EXAM: ULTRASOUND ABDOMEN LIMITED RIGHT UPPER QUADRANT COMPARISON:  None. FINDINGS: Gallbladder: Diffuse shadowing within the gallbladder presumably due to a large shadowing stone or multiple shadowing stones. Negative sonographic Murphy. Normal wall thickness of 2.9 mm Common bile duct: Diameter: 19.4 mm Liver: No focal lesion identified. Within normal limits in parenchymal echogenicity. Portal vein is patent on color Doppler imaging with normal direction of blood flow towards the liver. Other: None. IMPRESSION: 1. Shadowing at the gallbladder fossa presumably due to large or multiple stones in the gallbladder. No other features to suggest an acute cholecystitis  2. Dilated common bile duct measuring up to 19 mm. Recommend correlation with LFTs and  consider further evaluation with MRCP Electronically Signed   By: Jasmine Pang M.D.   On: 08/04/2019 21:38   ASSESSMENT AND PLAN:  Sandra Osborne  is a 48 y.o. African-American female with a known history of cerebral palsy, type 2 diabetes mellitus, dyslipidemia and hypertension, as well as seizure disorder, presented to the emergency room with acute onset of epigastric abdominal pain with associated nausea and vomiting with one episode of diarrhea.  1. Cholelithiasis and choledocholithiasis with associated obstructive jaundice. -  IV normal saline and pain management  -  will continue her on IV unasyn. She had low-grade temperature yesterday. If she remains afebrile white count is stabilized I will discontinue antibiotics in 24 hours - GI consultation with  Dr. Servando Snare appreciated -  ERCP An impacted stone was seen in the major papilla.                        - The major papilla appeared to be bulging.                        - The entire main bile duct was moderately dilated.                        - Choledocholithiasis was found. Complete removal was                         accomplished by biliary sphincterotomy and balloon                         extraction.                        - A biliary sphincterotomy was performed. -CLD -Surgery Dr Everlene Farrier is planning for GB removal   2. Hypertension.   -continue Lopressor and Zestril.  3. Seizure disorder.   - continue Zonegran and Keppra  4. Cerebral palsy, mental retardation with behavior changes.   - continue Seroquel and trazodone.  5. DVT prophylaxis.   -Subcutaneous Lovenox.  Case discussed with Care Management/Social Worker.   CODE STATUS: Full  DVT Prophylaxis: lovenox  TOTAL TIME TAKING CARE OF THIS PATIENT: *35* minutes.  >50% time spent on counselling and coordination of care  POSSIBLE D/C IN *2-3* DAYS, DEPENDING ON CLINICAL CONDITION.  Note: This dictation was prepared with Dragon dictation along with smaller phrase  technology. Any transcriptional errors that result from this process are unintentional.  Enedina Finner M.D on 08/05/2019 at 3:53 PM  Between 7am to 6pm - Pager - [phone]  After 6pm go to [email or web address] - password [email or web address] [facility]   CC: Primary care physician; Emogene Morgan, MDPatient ID: Sandra Osborne, female   DOB: 1971-06-14, 48 y.o.   MRN: [id number]

## 2019-08-05 NOTE — Op Note (Addendum)
[facility] Gastroenterology Patient Name: Sandra Osborne Procedure Date: 08/05/2019 1:11 PM MRN: [id number] Account #: [id number] Date of Birth: 03/09/1971 Admit Type: Inpatient Age: 48 [facility] 4 Gender: Female Note Status: Supervisor Override Procedure:             ERCP Indications:           Common bile duct stone(s) Providers:             Midge Miniumarren Stephnie Parlier MD, MD Medicines:             General Anesthesia Complications:         No immediate complications. Procedure:             Pre-Anesthesia Assessment:                        - Prior to the procedure, a History and Physical was                         performed, and patient medications and allergies were                         reviewed. The patient's tolerance of previous                         anesthesia was also reviewed. The risks and benefits                         of the procedure and the sedation options and risks                         were discussed with the patient. All questions were                         answered, and informed consent was obtained. Prior                         Anticoagulants: The patient has taken no previous                         anticoagulant or antiplatelet agents. ASA Grade                         Assessment: III - A patient with severe systemic                         disease. After reviewing the risks and benefits, the                         patient was deemed in satisfactory condition to                         undergo the procedure.                        After obtaining informed consent, the scope was passed                         under direct vision. Throughout the procedure, the  patient's blood pressure, pulse, and oxygen                         saturations were monitored continuously. The                         Duodenoscope was introduced through the mouth, and                         used to inject contrast into and used to  cannulate the                         bile duct. The ERCP was accomplished without                         difficulty. The patient tolerated the procedure well. Findings:      The scout film was normal. The major papilla contained an impacted       stone. The major papilla was bulging. The bile duct was deeply       cannulated with the 15 mm balloon. Contrast was injected. I personally       interpreted the bile duct images. There was brisk flow of contrast       through the ducts. Image quality was excellent. Contrast extended to the       entire biliary tree. The main bile duct was moderately dilated and       diffusely dilated. A wire was passed into the biliary tree. An 8 mm       biliary sphincterotomy was made with a traction (standard)       sphincterotome using ERBE electrocautery. There was no       post-sphincterotomy bleeding. The biliary tree was swept with a 15 mm       balloon starting at the bifurcation. One stone was removed. No stones       remained. Impression:            - An impacted stone was seen in the major papilla.                        - The major papilla appeared to be bulging.                        - The entire main bile duct was moderately dilated.                        - Choledocholithiasis was found. Complete removal was                         accomplished by biliary sphincterotomy and balloon                         extraction.                        - A biliary sphincterotomy was performed.                        - The biliary tree was swept. Recommendation:        - Return patient to hospital ward for ongoing care.                        -  Clear liquid diet.                        - Watch for pancreatitis, bleeding, perforation, and                         cholangitis.                        - Continue present medications. Procedure Code(s):     --- Professional ---                        [phone], Endoscopic retrograde cholangiopancreatography                          (ERCP); with removal of calculi/debris from                         biliary/pancreatic duct(s)                        43262, Endoscopic retrograde cholangiopancreatography                         (ERCP); with sphincterotomy/papillotomy                        [phone], Endoscopic catheterization of the biliary                         ductal system, radiological supervision and                         interpretation Diagnosis Code(s):     --- Professional ---                        K80.50, Calculus of bile duct without cholangitis or                         cholecystitis without obstruction CPT copyright 2019 American Medical Association. All rights reserved. The codes documented in this report are preliminary and upon coder review may  be revised to meet current compliance requirements. Midge Minium MD, MD 08/05/2019 1:53:13 PM This report has been signed electronically. Number of Addenda: 0 Note Initiated On: 08/05/2019 1:11 PM Estimated Blood Loss:  Estimated blood loss: none.      [facility]

## 2019-08-05 NOTE — Progress Notes (Signed)
The risks, benefits, complications, treatment options, and expected outcomes were discussed with her sister Sandra Osborne). The possibilities of bleeding, recurrent infection, finding a normal gallbladder, perforation of viscus organs, damage to surrounding structures, bile leak, abscess formation, needing a drain placed, the need for additional procedures, reaction to medication, pulmonary aspiration,  failure to diagnose a condition, the possible need to convert to an open procedure, and creating a complication requiring transfusion or operation were discussed with the patient. The family concurred with the proposed plan, giving informed consent.

## 2019-08-05 NOTE — Anesthesia Preprocedure Evaluation (Addendum)
Anesthesia Evaluation  Patient identified by MRN, date of birth, ID band Patient awake    Reviewed: Allergy & Precautions, H&P , NPO status , Patient's Chart, lab work & pertinent test results  History of Anesthesia Complications Negative for: history of anesthetic complications  Airway Mallampati: III  TM Distance: <3 FB Neck ROM: limited    Dental  (+) Poor Dentition, Missing   Pulmonary neg shortness of breath, asthma ,           Cardiovascular Exercise Tolerance: Good hypertension, (-) angina(-) Past MI + dysrhythmias      Neuro/Psych Seizures -, Poorly Controlled,  PSYCHIATRIC DISORDERS negative psych ROS   GI/Hepatic negative GI ROS, Neg liver ROS, neg GERD  ,  Endo/Other  diabetes, Type 2  Renal/GU      Musculoskeletal   Abdominal   Peds  Hematology negative hematology ROS (+)   Anesthesia Other Findings Past Medical History: No date: Cerebral palsy (HCC) No date: Diabetes ([place]) No date: Dyslipidemia No date: Dysrhythmia     Comment:  tachycardia No date: History of heart disease No date: Hypertension No date: Intermittent explosive disorder No date: Intractable seizures ([place]) No date: Mental retardation No date: Obesity  Past Surgical History: 01/23/2017: COLONOSCOPY WITH PROPOFOL; N/A     Comment:  Procedure: COLONOSCOPY WITH PROPOFOL;  Surgeon: Manya Silvas, MD;  Location: [facility] ENDOSCOPY;  Service:               Endoscopy;  Laterality: N/A; No date: dental extractions  BMI    Body Mass Index: 30.90 kg/m      Reproductive/Obstetrics negative OB ROS                             Anesthesia Physical Anesthesia Plan  ASA: III  Anesthesia Plan: General ETT   Post-op Pain Management:    Induction: Intravenous  PONV Risk Score and Plan: Ondansetron, Dexamethasone, Midazolam and Treatment may vary due to age or medical condition  Airway Management  Planned: Oral ETT  Additional Equipment:   Intra-op Plan:   Post-operative Plan: Extubation in OR  Informed Consent: I have reviewed the patients History and Physical, chart, labs and discussed the procedure including the risks, benefits and alternatives for the proposed anesthesia with the patient or authorized representative who has indicated his/her understanding and acceptance.     Dental Advisory Given  Plan Discussed with: Anesthesiologist, CRNA and Surgeon  Anesthesia Plan Comments: (History and and phone consent from sister Jensine Luz at [phone]     Sister was consented for risks of anesthesia including but not limited to:  - adverse reactions to medications - damage to teeth, lips or other oral mucosa - sore throat or hoarseness - Damage to heart, brain, lungs or loss of life  Sister voiced understanding.)       Anesthesia Quick Evaluation

## 2019-08-06 DIAGNOSIS — R1011 Right upper quadrant pain: Secondary | ICD-10-CM

## 2019-08-06 DIAGNOSIS — K8050 Calculus of bile duct without cholangitis or cholecystitis without obstruction: Secondary | ICD-10-CM

## 2019-08-06 DIAGNOSIS — E119 Type 2 diabetes mellitus without complications: Secondary | ICD-10-CM

## 2019-08-06 DIAGNOSIS — R569 Unspecified convulsions: Secondary | ICD-10-CM

## 2019-08-06 DIAGNOSIS — G809 Cerebral palsy, unspecified: Secondary | ICD-10-CM

## 2019-08-06 LAB — HEPATIC FUNCTION PANEL
ALT: 84 U/L — ABNORMAL HIGH (ref 0–44)
AST: 37 U/L (ref 15–41)
Albumin: 2 g/dL — ABNORMAL LOW (ref 3.5–5.0)
Alkaline Phosphatase: 209 U/L — ABNORMAL HIGH (ref 38–126)
Bilirubin, Direct: 1.1 mg/dL — ABNORMAL HIGH (ref 0.0–0.2)
Indirect Bilirubin: 0.8 mg/dL (ref 0.3–0.9)
Total Bilirubin: 1.9 mg/dL — ABNORMAL HIGH (ref 0.3–1.2)
Total Protein: 5.4 g/dL — ABNORMAL LOW (ref 6.5–8.1)

## 2019-08-06 LAB — BASIC METABOLIC PANEL
Anion gap: 4 — ABNORMAL LOW (ref 5–15)
BUN: 12 mg/dL (ref 6–20)
CO2: 19 mmol/L — ABNORMAL LOW (ref 22–32)
Calcium: 7.6 mg/dL — ABNORMAL LOW (ref 8.9–10.3)
Chloride: 120 mmol/L — ABNORMAL HIGH (ref 98–111)
Creatinine, Ser: 0.65 mg/dL (ref 0.44–1.00)
GFR calc Af Amer: >60 mL/min (ref >60)
GFR calc non Af Amer: >60 mL/min (ref >60)
Glucose, Bld: 106 mg/dL — ABNORMAL HIGH (ref 70–99)
Potassium: 3.5 mmol/L (ref 3.5–5.1)
Sodium: 143 mmol/L (ref 135–145)

## 2019-08-06 LAB — LIPASE, BLOOD: Lipase: 83 U/L — ABNORMAL HIGH (ref 11–51)

## 2019-08-06 MED ORDER — SODIUM CHLORIDE 0.9 % IV SOLN
INTRAVENOUS | Status: DC | PRN
  Administered 2019-08-06: 250 mL via INTRAVENOUS
  Administered 2019-08-07: 18:00:00 via INTRAVENOUS
  Administered 2019-08-08: 17:00:00 250 mL via INTRAVENOUS

## 2019-08-06 MED ORDER — SODIUM CHLORIDE 0.9 % IV SOLN
INTRAVENOUS | Status: DC
  Administered 2019-08-06 – 2019-08-07 (×2): via INTRAVENOUS

## 2019-08-06 MED ORDER — LEVETIRACETAM ER 500 MG PO TB24
1000.0000 mg | ORAL_TABLET | Freq: Two times a day (BID) | ORAL | Status: DC
  Administered 2019-08-06 – 2019-08-09 (×7): 1000 mg via ORAL
  Filled 2019-08-06 (×8): qty 2

## 2019-08-06 MED ORDER — ORAL CARE MOUTH RINSE
15.0000 mL | Freq: Two times a day (BID) | OROMUCOSAL | Status: DC
  Administered 2019-08-06 – 2019-08-10 (×4): 15 mL via OROMUCOSAL

## 2019-08-06 MED ORDER — CHLORHEXIDINE GLUCONATE 0.12 % MT SOLN
15.0000 mL | Freq: Two times a day (BID) | OROMUCOSAL | Status: DC
  Administered 2019-08-06 – 2019-08-10 (×7): 15 mL via OROMUCOSAL
  Filled 2019-08-06 (×7): qty 15

## 2019-08-06 NOTE — Progress Notes (Addendum)
GI Inpatient Follow-up Note  Subjective:  Patient seen in follow-up for choledocholithiasis s/p ERCP with one stone removed yesterday by Dr. Servando Snare. Per nursing, no acute events overnight. Patient is resting comfortably in hospital bed. No family members are present in room during time of examination. There appears to be no complaint of nausea, vomiting, or abdominal pain. No fever. Dr. Everlene Farrier in General Surgery is following the patient and tentatively plan is for laparoscopic cholecystectomy on Monday.   Scheduled Inpatient Medications:  . calcium-vitamin D  1 tablet Oral BID WC  . docusate sodium  100 mg Oral Daily  . enoxaparin (LOVENOX) injection  40 mg Subcutaneous Q24H  . lisinopril  10 mg Oral Daily  . loratadine  10 mg Oral Daily  . metoprolol tartrate  50 mg Oral BID  . QUEtiapine  200 mg Oral BID  . zonisamide  300 mg Oral BID    Continuous Inpatient Infusions:   . sodium chloride 100 mL/hr at 08/05/19 2248  . ampicillin-sulbactam (UNASYN) IV 3 g (08/06/19 0555)  . levETIRAcetam 1,000 mg (08/05/19 2347)    PRN Inpatient Medications:  acetaminophen **OR** acetaminophen, magnesium hydroxide, morphine injection, ondansetron **OR** ondansetron (ZOFRAN) IV, oxyCODONE, traZODone  Review of Systems: Unable to obtain due to patient's baseline mental status    Physical Examination: BP 100/62 (BP Location: Left Arm)   Pulse 84   Temp 98.6 F (37 C) (Axillary)   Resp 17   Ht 5\' 4"  (1.626 m)   Wt 81.6 kg   SpO2 99%   BMI 30.88 kg/m  Gen: No acute distress HEENT: PEERLA, EOMI, Neck: supple, no JVD or thyromegaly Chest: CTA bilaterally, no wheezes, crackles, or other adventitious sounds CV: RRR, no m/g/c/r Abd: soft, NT, ND, +BS in all four quadrants; no HSM, guarding, ridigity, or rebound tenderness Ext: no edema, well perfused with 2+ pulses, Skin: no rash or lesions noted Lymph: no LAD  Data: Lab Results  Component Value Date   WBC 12.4 (H) 08/05/2019   HGB 12.5  08/05/2019   HCT 41.3 08/05/2019   MCV 97.2 08/05/2019   PLT 163 08/05/2019   Recent Labs  Lab 08/04/19 1752 08/05/19 0415  HGB 14.3 12.5   Lab Results  Component Value Date   NA 143 08/06/2019   K 3.5 08/06/2019   CL 120 (H) 08/06/2019   CO2 19 (L) 08/06/2019   BUN 12 08/06/2019   CREATININE 0.65 08/06/2019   Lab Results  Component Value Date   ALT 139 (H) 08/05/2019   AST 92 (H) 08/05/2019   ALKPHOS 289 (H) 08/05/2019   BILITOT 6.4 (H) 08/05/2019   No results for input(s): APTT, INR, PTT in the last 168 hours.   ERCP 08/05/2019: Impression:            - An impacted stone was seen in the major papilla. - The major papilla appeared to be bulging. - The entire main bile duct was moderately dilated. - Choledocholithiasis was found. Complete removal was  accomplished by biliary sphincterotomy, balloon extraction. - A biliary sphincterotomy was performed. - The biliary tree was swept.  Assessment/Plan:  48 y/o AA female with a PMH of cerebral palsy, T2DM, hyperlipidemia, HTN, Hx of seizure disorder, and obesity admitted for choledocholithaisis  1. Choledocholithiasis s/p ERCP with stone removal 2. Cerebral palsy - poor historian. Sister is legal guardian.  -Patient is s/p ERCP yesterday afternoon by Dr. 54 where one CBD stone was removed -Liver enzymes are trending down with bilirubin 1.9  today. The aminotransferases are trending down as well -Patient is responding appropriately  -Continue to monitor closely for potential signs of post-ERCP pancreatitis, cholangitis, and bleeding. Continue to monitor vital signs -Surgery is following the patient. Plan for laparoscopic cholecystectomy on Monday -Continue clear liquids for breakfast. If she tolerates, can advance to full liquid diet for lunch and dinner.  - We will continue to follow along   Please call with questions or concerns.    Octavia Bruckner, PA-C [facility]  Gastroenterology [phone] (Cell)

## 2019-08-06 NOTE — Progress Notes (Signed)
Pharmacy Electrolyte Monitoring Consult:  Pharmacy consulted to assist in monitoring and replacing electrolytes in this 48 y.o. female admitted on 08/04/2019 with Abdominal Pain   Labs:  Sodium (mmol/L)  Date Value  08/06/2019 143   Potassium (mmol/L)  Date Value  08/06/2019 3.5   Magnesium (mg/dL)  Date Value  08/05/2019 1.8   Calcium (mg/dL)  Date Value  08/06/2019 7.6 (L)   Albumin (g/dL)  Date Value  08/05/2019 2.7 (L)    Assessment/Plan:  K 3.5 - no replacement at this time. Pt was started on lisinopril on 11/6.   BMP in AM   Pharmacy will continue to follow.   Oswald Hillock, PharmD, BCPS 08/06/2019 7:46 AM

## 2019-08-06 NOTE — Progress Notes (Signed)
[facility]  - [place] [facility]   PATIENT NAME: Sandra Osborne    MR#:  [id number]  DATE OF BIRTH:  05/29/1971  SUBJECTIVE:  pt more awake. Wants to eat food. New issues reported by staff.  She has baseline mental retardation/cerebral palsy. No family in the room. Appears to be comfortable. REVIEW OF SYSTEMS:   Review of Systems  Unable to perform ROS: Mental acuity   Tolerating Diet: clear liquid Tolerating PT: patient walks by herself baseline  DRUG ALLERGIES:   Allergies  Allergen Reactions  . Depakote [Divalproex Sodium] Other (See Comments)  . Primidone Other (See Comments)    VITALS:  Blood pressure 100/62, pulse 84, temperature 98.6 F (37 C), temperature source Axillary, resp. rate 17, height 5\' 4"  (1.626 m), weight 81.6 kg, SpO2 99 %.  PHYSICAL EXAMINATION:   Physical Exam limited pt has MR  GENERAL:  48 y.o.-year-old patient lying in the bed with no acute distress. Obese  EYES: Pupils equal, round, reactive to light and accommodation. No scleral icterus. Extraocular muscles intact.  HEENT: Head atraumatic, normocephalic. Oropharynx and nasopharynx clear.  NECK:  Supple, no jugular venous distention. No thyroid enlargement, no tenderness.  LUNGS: Normal breath sounds bilaterally, no wheezing, rales, rhonchi. No use of accessory muscles of respiration.  CARDIOVASCULAR: S1, S2 normal. No murmurs, rubs, or gallops.  ABDOMEN: Soft, nontender, nondistended. Bowel sounds present. No organomegaly or mass.  EXTREMITIES: No cyanosis, clubbing or edema b/l.     PSYCHIATRIC:  patient is alert and and at baseline has mental retardation with cerebral palsy   LABORATORY PANEL:  CBC Recent Labs  Lab 08/05/19 0415  WBC 12.4*  HGB 12.5  HCT 41.3  PLT 163    Chemistries  Recent Labs  Lab 08/05/19 0415 08/06/19 0355  NA 140 143  K 3.1* 3.5  CL 115* 120*  CO2 16* 19*  GLUCOSE 106* 106*  BUN 7 12  CREATININE 0.67 0.65  CALCIUM 7.6* 7.6*   MG 1.8  --   AST 92*  --   ALT 139*  --   ALKPHOS 289*  --   BILITOT 6.4*  --    Cardiac Enzymes No results for input(s): TROPONINI in the last 168 hours. RADIOLOGY:  Mr 3d Recon At Scanner  Result Date: 08/05/2019 CLINICAL DATA:  Evaluate gallstones and dilated common bile duct. EXAM: MRI ABDOMEN WITHOUT AND WITH CONTRAST (INCLUDING MRCP) TECHNIQUE: Multiplanar multisequence MR imaging of the abdomen was performed both before and after the administration of intravenous contrast. Heavily T2-weighted images of the biliary and pancreatic ducts were obtained, and three-dimensional MRCP images were rendered by post processing. CONTRAST:  8mL GADAVIST GADOBUTROL 1 MMOL/ML IV SOLN COMPARISON:  Ultrasound 08/04/2019 FINDINGS: Lower chest: No pleural effusion. Hepatobiliary: There is marked diffuse intrahepatic and common bile duct dilatation. The common bile duct measures 2.1 cm in maximum diameter. Debris is seen layering within the distal common bile duct. At the ampulla there is an impacted stone which measures 1.1 cm, image 32/10 and image 17/3. Multiple stones identified within the gallbladder these measure up to 1.8 cm. Diffuse low signal throughout the wall of the gallbladder is noted which may reflect gallbladder wall calcifications. Pancreas: There is diffuse edema of the pancreas compatible with acute pancreatitis. The main pancreatic duct is increased in caliber measuring up to 6 mm. No evidence of pancreatic necrosis or pseudocyst formation identified at this time. Spleen:  Within normal limits in size and appearance. Adrenals/Urinary Tract: Normal appearance  of the adrenal glands. The kidneys are unremarkable. No mass or hydronephrosis. Stomach/Bowel: Visualized portions within the abdomen are unremarkable. Vascular/Lymphatic: No pathologically enlarged lymph nodes identified. No abdominal aortic aneurysm demonstrated. Other: There is a small volume of free fluid identified within the right upper  quadrant of the abdomen. Musculoskeletal: No suspicious bone lesions identified. IMPRESSION: 1. Acute pancreatitis. No findings to suggest pseudocyst or pancreatic necrosis. 2. Gallstones. Marked dilatation of the intrahepatic bile ducts and common bile ducts noted. Within the distal common bile duct there is an impacted stone at the level of the ampulla measuring 1.1 cm. 3. These results will be called to the ordering clinician or representative by the Radiologist Assistant, and communication documented in the PACS or zVision Dashboard. Electronically Signed   By: Kerby Moors M.D.   On: 08/05/2019 10:02   Dg C-arm 1-60 Min-no Report  Result Date: 08/05/2019 Fluoroscopy was utilized by the requesting physician.  No radiographic interpretation.   Mr Abdomen Mrcp Moise Boring Contast  Result Date: 08/05/2019 CLINICAL DATA:  Evaluate gallstones and dilated common bile duct. EXAM: MRI ABDOMEN WITHOUT AND WITH CONTRAST (INCLUDING MRCP) TECHNIQUE: Multiplanar multisequence MR imaging of the abdomen was performed both before and after the administration of intravenous contrast. Heavily T2-weighted images of the biliary and pancreatic ducts were obtained, and three-dimensional MRCP images were rendered by post processing. CONTRAST:  53mL GADAVIST GADOBUTROL 1 MMOL/ML IV SOLN COMPARISON:  Ultrasound 08/04/2019 FINDINGS: Lower chest: No pleural effusion. Hepatobiliary: There is marked diffuse intrahepatic and common bile duct dilatation. The common bile duct measures 2.1 cm in maximum diameter. Debris is seen layering within the distal common bile duct. At the ampulla there is an impacted stone which measures 1.1 cm, image 32/10 and image 17/3. Multiple stones identified within the gallbladder these measure up to 1.8 cm. Diffuse low signal throughout the wall of the gallbladder is noted which may reflect gallbladder wall calcifications. Pancreas: There is diffuse edema of the pancreas compatible with acute pancreatitis. The  main pancreatic duct is increased in caliber measuring up to 6 mm. No evidence of pancreatic necrosis or pseudocyst formation identified at this time. Spleen:  Within normal limits in size and appearance. Adrenals/Urinary Tract: Normal appearance of the adrenal glands. The kidneys are unremarkable. No mass or hydronephrosis. Stomach/Bowel: Visualized portions within the abdomen are unremarkable. Vascular/Lymphatic: No pathologically enlarged lymph nodes identified. No abdominal aortic aneurysm demonstrated. Other: There is a small volume of free fluid identified within the right upper quadrant of the abdomen. Musculoskeletal: No suspicious bone lesions identified. IMPRESSION: 1. Acute pancreatitis. No findings to suggest pseudocyst or pancreatic necrosis. 2. Gallstones. Marked dilatation of the intrahepatic bile ducts and common bile ducts noted. Within the distal common bile duct there is an impacted stone at the level of the ampulla measuring 1.1 cm. 3. These results will be called to the ordering clinician or representative by the Radiologist Assistant, and communication documented in the PACS or zVision Dashboard. Electronically Signed   By: Kerby Moors M.D.   On: 08/05/2019 10:02   US Abdomen Limited Ruq  Result Date: 08/04/2019 CLINICAL DATA:  Right upper quadrant pain EXAM: ULTRASOUND ABDOMEN LIMITED RIGHT UPPER QUADRANT COMPARISON:  None. FINDINGS: Gallbladder: Diffuse shadowing within the gallbladder presumably due to a large shadowing stone or multiple shadowing stones. Negative sonographic Murphy. Normal wall thickness of 2.9 mm Common bile duct: Diameter: 19.4 mm Liver: No focal lesion identified. Within normal limits in parenchymal echogenicity. Portal vein is patent on color Doppler  imaging with normal direction of blood flow towards the liver. Other: None. IMPRESSION: 1. Shadowing at the gallbladder fossa presumably due to large or multiple stones in the gallbladder. No other features to  suggest an acute cholecystitis 2. Dilated common bile duct measuring up to 19 mm. Recommend correlation with LFTs and consider further evaluation with MRCP Electronically Signed   By: Jasmine Pang M.D.   On: 08/04/2019 21:38   ASSESSMENT AND PLAN:  Calea Hribar  is a 48 y.o. African-American female with a known history of cerebral palsy, type 2 diabetes mellitus, dyslipidemia and hypertension, as well as seizure disorder, presented to the emergency room with acute onset of epigastric abdominal pain with associated nausea and vomiting with one episode of diarrhea.  1. Cholelithiasis and choledocholithiasis with associated obstructive jaundice. -  IV normal saline and pain management  -  will continue her on IV unasyn. She had low-grade temperature yesterday. If she remains afebrile white count is stabilized I will discontinue antibiotics in 24 hours - GI consultation with  Dr. Servando Snare appreciated -  ERCP An impacted stone was seen in the major papilla.                        - The major papilla appeared to be bulging.                        - The entire main bile duct was moderately dilated.                        - Choledocholithiasis was found. Complete removal was                         accomplished by biliary sphincterotomy and balloon                         extraction.                        - A biliary sphincterotomy was performed. -CLD--FLD  -Surgery Dr Everlene Farrier is planning for GB removal on Monday  2. Hypertension.   -continue Lopressor and Zestril.  3. Seizure disorder.   - continue Zonegran and Keppra  4. Cerebral palsy, mental retardation with behavior changes.   - continue Seroquel and trazodone.  5. DVT prophylaxis.   -Subcutaneous Lovenox.  Discussed with sister gena Delvecchio  Case discussed with Care Management/Social Worker.   CODE STATUS: Full  DVT Prophylaxis: lovenox  TOTAL TIME TAKING CARE OF THIS PATIENT: *35* minutes.  >50% time spent on counselling and  coordination of care  POSSIBLE D/C IN *2-3* DAYS, DEPENDING ON CLINICAL CONDITION.  Note: This dictation was prepared with Dragon dictation along with smaller phrase technology. Any transcriptional errors that result from this process are unintentional.  Enedina Finner M.D on 08/06/2019 at 9:12 AM  Between 7am to 6pm - Pager - [phone]  After 6pm go to [email or web address] - password [email or web address] [facility]   CC: Primary care physician; Emogene Morgan, MDPatient ID: Liberty Handy, female   DOB: 1971-05-13, 48 y.o.   MRN: [id number]

## 2019-08-06 NOTE — Progress Notes (Signed)
[place] Hospital Day(s): 2.   Post op day(s): 1 Day Post-Op.   Interval History: Patient seen and examined, no acute events or new complaints overnight. Patient reports feeling a little bit better than yesterday.  She reported abdominal pain has been improving.  She has been able to rest.  There is no pain radiation at this point.  There is no alleviating or aggravating factor with pain.  Denies nausea or vomiting.  Vital signs in last 24 hours: [min-max] current  Temp:  [97.4 F (36.3 C)-100.2 F (37.9 C)] 98.6 F (37 C) (11/07 0740) Pulse Rate:  [75-120] 84 (11/07 0740) Resp:  [17-30] 17 (11/07 0740) BP: (95-120)/(62-91) 100/62 (11/07 0740) SpO2:  [96 %-100 %] 99 % (11/07 0740) Weight:  [81.6 kg] 81.6 kg (11/06 1256)     Height: 5\' 4"  (162.6 cm) Weight: 81.6 kg BMI (Calculated): 30.86   Physical Exam:  Constitutional: alert, cooperative and no distress  Respiratory: breathing non-labored at rest  Cardiovascular: regular rate and sinus rhythm  Gastrointestinal: soft, mild-tender, and non-distended  Labs:  CBC Latest Ref Rng & Units 08/05/2019 08/04/2019 03/12/2016  WBC 4.0 - 10.5 K/uL 12.4(H) 11.0(H) 5.6  Hemoglobin 12.0 - 15.0 g/dL 12.5 14.3 14.2  Hematocrit 36.0 - 46.0 % 41.3 45.2 44.0  Platelets 150 - 400 K/uL 163 163 222   CMP Latest Ref Rng & Units 08/06/2019 08/05/2019 08/04/2019  Glucose 70 - 99 mg/dL 106(H) 106(H) 127(H)  BUN 6 - 20 mg/dL 12 7 QUANTITY NOT SUFFICIENT, UNABLE TO PERFORM TEST  Creatinine 0.44 - 1.00 mg/dL 0.65 0.67 <0.30(L)  Sodium 135 - 145 mmol/L 143 140 141  Potassium 3.5 - 5.1 mmol/L 3.5 3.1(L) 3.8  Chloride 98 - 111 mmol/L 120(H) 115(H) 110  CO2 22 - 32 mmol/L 19(L) 16(L) 19(L)  Calcium 8.9 - 10.3 mg/dL 7.6(L) 7.6(L) 8.9  Total Protein 6.5 - 8.1 g/dL - 6.3(L) 7.5  Total Bilirubin 0.3 - 1.2 mg/dL - 6.4(H) 5.4(H)  Alkaline Phos 38 - 126 U/L - 289(H) 350(H)  AST 15 - 41 U/L - 92(H) 120(H)  ALT 0 - 44 U/L - 139(H) 182(H)     Imaging studies: I personally evaluated the images of the ERCP.  I also evaluated the pictures from the ERCP report.  I was able to identify the stones in the common bile duct.  After swiping the common bile duct final images looks like there is no residual stones in the common bile duct.   Assessment/Plan:  48 year old female with choledocholithiasis with past medical history of cerebral palsy, diabetes, dyslipidemia, hypertension, seizures and obesity.  Patient underwent ERCP yesterday.  This morning patient seems that she is doing well.  She tolerated the procedure.  New labs are in process.  From surgery standpoint will follow labs to make sure that the bilirubin is trending down and patient does not have post ERCP pancreatitis.  If she continues to respond to current therapy we will plan for cholecystectomy on Monday by Dr. Dahlia Byes.  Agree with current management.  Arnold Long, MD

## 2019-08-07 DIAGNOSIS — K8020 Calculus of gallbladder without cholecystitis without obstruction: Secondary | ICD-10-CM

## 2019-08-07 LAB — BASIC METABOLIC PANEL
Anion gap: 5 (ref 5–15)
BUN: 15 mg/dL (ref 6–20)
CO2: 19 mmol/L — ABNORMAL LOW (ref 22–32)
Calcium: 7.6 mg/dL — ABNORMAL LOW (ref 8.9–10.3)
Chloride: 118 mmol/L — ABNORMAL HIGH (ref 98–111)
Creatinine, Ser: 0.75 mg/dL (ref 0.44–1.00)
GFR calc Af Amer: >60 mL/min (ref >60)
GFR calc non Af Amer: >60 mL/min (ref >60)
Glucose, Bld: 92 mg/dL (ref 70–99)
Potassium: 3.4 mmol/L — ABNORMAL LOW (ref 3.5–5.1)
Sodium: 142 mmol/L (ref 135–145)

## 2019-08-07 LAB — URINALYSIS, ROUTINE W REFLEX MICROSCOPIC
Bacteria, UA: NONE SEEN
Bilirubin Urine: NEGATIVE
Glucose, UA: NEGATIVE mg/dL
Ketones, ur: NEGATIVE mg/dL
Leukocytes,Ua: NEGATIVE
Nitrite: NEGATIVE
Protein, ur: NEGATIVE mg/dL
Specific Gravity, Urine: 1.027 (ref 1.005–1.030)
pH: 5 (ref 5.0–8.0)

## 2019-08-07 LAB — MAGNESIUM: Magnesium: 2 mg/dL (ref 1.7–2.4)

## 2019-08-07 MED ORDER — POTASSIUM CHLORIDE CRYS ER 20 MEQ PO TBCR
30.0000 meq | EXTENDED_RELEASE_TABLET | Freq: Once | ORAL | Status: AC
  Administered 2019-08-07: 11:00:00 30 meq via ORAL
  Filled 2019-08-07: qty 1

## 2019-08-07 NOTE — Progress Notes (Signed)
[facility] [place] NAME: Sandra Osborne    MR#:  [id number]  DATE OF BIRTH:  [birth year]  SUBJECTIVE:  pt more awake. Wants to eat food. New issues reported by staff.  She has baseline mental retardation/cerebral palsy. No family in the room. Appears to be comfortable. REVIEW OF SYSTEMS:   Review of Systems  Unable to perform ROS: Mental acuity   Tolerating Diet: clear liquid Tolerating PT: patient walks by herself baseline  DRUG ALLERGIES:   Allergies  Allergen Reactions  . Depakote [Divalproex Sodium] Other (See Comments)  . Primidone Other (See Comments)    VITALS:  Blood pressure 96/77, pulse 62, temperature 97.9 F (36.6 C), temperature source Oral, resp. rate 18, height 5\' 4"  (1.626 m), weight 81.6 kg, SpO2 100 %.  PHYSICAL EXAMINATION:   Physical Exam limited pt has MR  GENERAL:  48 y.o.-year-old patient lying in the bed with no acute distress. Obese  EYES: Pupils equal, round, reactive to light and accommodation. No scleral icterus. Extraocular muscles intact.  HEENT: Head atraumatic, normocephalic. Oropharynx and nasopharynx clear.  NECK:  Supple, no jugular venous distention. No thyroid enlargement, no tenderness.  LUNGS: Normal breath sounds bilaterally, no wheezing, rales, rhonchi. No use of accessory muscles of respiration.  CARDIOVASCULAR: S1, S2 normal. No murmurs, rubs, or gallops.  ABDOMEN: Soft, nontender, nondistended. Bowel sounds present. No organomegaly or mass.  EXTREMITIES: No cyanosis, clubbing or edema b/l.     PSYCHIATRIC:  patient is alert and and at baseline has mental retardation with cerebral palsy   LABORATORY PANEL:  CBC Recent Labs  Lab 08/05/19 0415  WBC 12.4*  HGB 12.5  HCT 41.3  PLT 163    Chemistries  Recent Labs  Lab 08/06/19 0355 08/07/19 0857  NA 143 142  K 3.5 3.4*  CL 120* 118*  CO2 19* 19*  GLUCOSE 106* 92  BUN 12 15  CREATININE 0.65 0.75  CALCIUM 7.6* 7.6*  MG   --  2.0  AST 37  --   ALT 84*  --   ALKPHOS 209*  --   BILITOT 1.9*  --    Cardiac Enzymes No results for input(s): TROPONINI in the last 168 hours. RADIOLOGY:  Mr 3d Recon At Scanner  Result Date: 08/05/2019 CLINICAL DATA:  Evaluate gallstones and dilated common bile duct. EXAM: MRI ABDOMEN WITHOUT AND WITH CONTRAST (INCLUDING MRCP) TECHNIQUE: Multiplanar multisequence MR imaging of the abdomen was performed both before and after the administration of intravenous contrast. Heavily T2-weighted images of the biliary and pancreatic ducts were obtained, and three-dimensional MRCP images were rendered by post processing. CONTRAST:  52mL GADAVIST GADOBUTROL 1 MMOL/ML IV SOLN COMPARISON:  Ultrasound 08/04/2019 FINDINGS: Lower chest: No pleural effusion. Hepatobiliary: There is marked diffuse intrahepatic and common bile duct dilatation. The common bile duct measures 2.1 cm in maximum diameter. Debris is seen layering within the distal common bile duct. At the ampulla there is an impacted stone which measures 1.1 cm, image 32/10 and image 17/3. Multiple stones identified within the gallbladder these measure up to 1.8 cm. Diffuse low signal throughout the wall of the gallbladder is noted which may reflect gallbladder wall calcifications. Pancreas: There is diffuse edema of the pancreas compatible with acute pancreatitis. The main pancreatic duct is increased in caliber measuring up to 6 mm. No evidence of pancreatic necrosis or pseudocyst formation identified at this time. Spleen:  Within normal limits in size and appearance. Adrenals/Urinary Tract: Normal appearance  of the adrenal glands. The kidneys are unremarkable. No mass or hydronephrosis. Stomach/Bowel: Visualized portions within the abdomen are unremarkable. Vascular/Lymphatic: No pathologically enlarged lymph nodes identified. No abdominal aortic aneurysm demonstrated. Other: There is a small volume of free fluid identified within the right upper quadrant  of the abdomen. Musculoskeletal: No suspicious bone lesions identified. IMPRESSION: 1. Acute pancreatitis. No findings to suggest pseudocyst or pancreatic necrosis. 2. Gallstones. Marked dilatation of the intrahepatic bile ducts and common bile ducts noted. Within the distal common bile duct there is an impacted stone at the level of the ampulla measuring 1.1 cm. 3. These results will be called to the ordering clinician or representative by the Radiologist Assistant, and communication documented in the PACS or zVision Dashboard. Electronically Signed   By: Signa Kellaylor  Stroud M.D.   On: 08/05/2019 10:02   Dg C-arm 1-60 Min-no Report  Result Date: 08/05/2019 Fluoroscopy was utilized by the requesting physician.  No radiographic interpretation.   Mr Abdomen Mrcp Vivien RossettiW Wo Contast  Result Date: 08/05/2019 CLINICAL DATA:  Evaluate gallstones and dilated common bile duct. EXAM: MRI ABDOMEN WITHOUT AND WITH CONTRAST (INCLUDING MRCP) TECHNIQUE: Multiplanar multisequence MR imaging of the abdomen was performed both before and after the administration of intravenous contrast. Heavily T2-weighted images of the biliary and pancreatic ducts were obtained, and three-dimensional MRCP images were rendered by post processing. CONTRAST:  8mL GADAVIST GADOBUTROL 1 MMOL/ML IV SOLN COMPARISON:  Ultrasound 08/04/2019 FINDINGS: Lower chest: No pleural effusion. Hepatobiliary: There is marked diffuse intrahepatic and common bile duct dilatation. The common bile duct measures 2.1 cm in maximum diameter. Debris is seen layering within the distal common bile duct. At the ampulla there is an impacted stone which measures 1.1 cm, image 32/10 and image 17/3. Multiple stones identified within the gallbladder these measure up to 1.8 cm. Diffuse low signal throughout the wall of the gallbladder is noted which may reflect gallbladder wall calcifications. Pancreas: There is diffuse edema of the pancreas compatible with acute pancreatitis. The main  pancreatic duct is increased in caliber measuring up to 6 mm. No evidence of pancreatic necrosis or pseudocyst formation identified at this time. Spleen:  Within normal limits in size and appearance. Adrenals/Urinary Tract: Normal appearance of the adrenal glands. The kidneys are unremarkable. No mass or hydronephrosis. Stomach/Bowel: Visualized portions within the abdomen are unremarkable. Vascular/Lymphatic: No pathologically enlarged lymph nodes identified. No abdominal aortic aneurysm demonstrated. Other: There is a small volume of free fluid identified within the right upper quadrant of the abdomen. Musculoskeletal: No suspicious bone lesions identified. IMPRESSION: 1. Acute pancreatitis. No findings to suggest pseudocyst or pancreatic necrosis. 2. Gallstones. Marked dilatation of the intrahepatic bile ducts and common bile ducts noted. Within the distal common bile duct there is an impacted stone at the level of the ampulla measuring 1.1 cm. 3. These results will be called to the ordering clinician or representative by the Radiologist Assistant, and communication documented in the PACS or zVision Dashboard. Electronically Signed   By: Signa Kellaylor  Stroud M.D.   On: 08/05/2019 10:02   ASSESSMENT AND PLAN:  Sandra Osborne  is a 48 y.o. African-American female with a known history of cerebral palsy, type 2 diabetes mellitus, dyslipidemia and hypertension, as well as seizure disorder, presented to the emergency room with acute onset of epigastric abdominal pain with associated nausea and vomiting with one episode of diarrhea.  1. Cholelithiasis and choledocholithiasis with associated obstructive jaundice. - pain management  -  will continue her on IV unasyn. -  GI consultation with  Dr. Servando Snare appreciated -  ERCP An impacted stone was seen in the major papilla.                        - The major papilla appeared to be bulging.                        - The entire main bile duct was moderately dilated.                         - Choledocholithiasis was found. Complete removal was                         accomplished by biliary sphincterotomy and balloon                         extraction.                        - A biliary sphincterotomy was performed. -CLD--FLD  -Surgery Dr Everlene Farrier is planning for GB removal on Monday  2. Hypertension.   -continue Lopressor and Zestril.  3. Seizure disorder.   - continue Zonegran and Keppra  4. Cerebral palsy, mental retardation with behavior changes.   - continue Seroquel and trazodone.  5. DVT prophylaxis.   -Subcutaneous Lovenox.   sister gena Wilemon is aware of the plan  Case discussed with Care Management/Social Worker.   CODE STATUS: Full  DVT Prophylaxis: lovenox  TOTAL TIME TAKING CARE OF THIS PATIENT: *25* minutes.  >50% time spent on counselling and coordination of care  POSSIBLE D/C IN *1-2* DAYS, DEPENDING ON CLINICAL CONDITION.  Note: This dictation was prepared with Dragon dictation along with smaller phrase technology. Any transcriptional errors that result from this process are unintentional.  Enedina Finner M.D on 08/07/2019 at 9:33 AM  Between 7am to 6pm - Pager - [phone]  After 6pm go to [email or web address] - password [email or web address] [facility]   CC: Primary care physician; Emogene Morgan, MDPatient ID: Sandra Osborne, female   DOB: [birth year], 48 y.o.   MRN: [id number]

## 2019-08-07 NOTE — Progress Notes (Addendum)
GI Inpatient Follow-up Note  Subjective:  Patient seen in follow-up for choledocholithiasis s/p ERCP on Friday afternoon with one stone removed. No acute events overnight. Patient appears to be comfortable with no abdominal pain, nausea, or vomiting. No complaints per patient. Caregiver present in room.  Scheduled Inpatient Medications:  . calcium-vitamin D  1 tablet Oral BID WC  . chlorhexidine  15 mL Mouth Rinse BID  . docusate sodium  100 mg Oral Daily  . enoxaparin (LOVENOX) injection  40 mg Subcutaneous Q24H  . levETIRAcetam  1,000 mg Oral BID  . lisinopril  10 mg Oral Daily  . loratadine  10 mg Oral Daily  . mouth rinse  15 mL Mouth Rinse q12n4p  . metoprolol tartrate  50 mg Oral BID  . QUEtiapine  200 mg Oral BID  . zonisamide  300 mg Oral BID    Continuous Inpatient Infusions:   . sodium chloride Stopped (08/06/19 1403)  . ampicillin-sulbactam (UNASYN) IV 3 g (08/07/19 1056)    PRN Inpatient Medications:  sodium chloride, acetaminophen **OR** acetaminophen, magnesium hydroxide, morphine injection, ondansetron **OR** ondansetron (ZOFRAN) IV, oxyCODONE, traZODone  Review of Systems:  Unable to obtain due to patient's baseline developmental delay   Physical Examination: BP 96/77 (BP Location: Right Arm)   Pulse 62   Temp 97.9 F (36.6 C) (Oral)   Resp 18   Ht 5\' 4"  (1.626 m)   Wt 81.6 kg   SpO2 100%   BMI 30.88 kg/m  Gen: No acute distress HEENT: PEERLA, EOMI, Neck: supple, no JVD or thyromegaly Chest: CTA bilaterally, no wheezes, crackles, or other adventitious sounds CV: RRR, no m/g/c/r Abd: soft, NT, ND, +BS in all four quadrants; no HSM, guarding, ridigity, or rebound tenderness Ext: no edema, well perfused with 2+ pulses, Skin: no rash or lesions noted Lymph: no LAD  Data: Lab Results  Component Value Date   WBC 12.4 (H) 08/05/2019   HGB 12.5 08/05/2019   HCT 41.3 08/05/2019   MCV 97.2 08/05/2019   PLT 163 08/05/2019   Recent Labs  Lab  08/04/19 1752 08/05/19 0415  HGB 14.3 12.5   Lab Results  Component Value Date   NA 142 08/07/2019   K 3.4 (L) 08/07/2019   CL 118 (H) 08/07/2019   CO2 19 (L) 08/07/2019   BUN 15 08/07/2019   CREATININE 0.75 08/07/2019   Lab Results  Component Value Date   ALT 84 (H) 08/06/2019   AST 37 08/06/2019   ALKPHOS 209 (H) 08/06/2019   BILITOT 1.9 (H) 08/06/2019   No results for input(s): APTT, INR, PTT in the last 168 hours.   ERCP 08/05/2019: Impression:  - An impacted stone was seen in the major papilla. - The major papilla appeared to be bulging. - The entire main bile duct was moderately dilated. - Choledocholithiasis was found. Complete removal was  accomplished by biliary sphincterotomy, balloon extraction. - A biliary sphincterotomy was performed. - The biliary tree was swept.  Assessment/Plan:  48 y/o AA female with a PMH of cerebral palsy, T2DM, hyperlipidemia, HTN, Hx of seizure disorder, and obesity admitted for choledocholithaisis  1. Choledocholithiasis s/p ERCP with stone removal 2. Cerebral palsy - poor historian. Sister is legal guardian.  -Patient is doing well s/p ERCP on Friday afternoon. No signs of cholangitis or post-ERCP pancreatitis. Patient is without complaints -Surgery is also following patient -Plan for laparoscopic cholecystectomy tomorrow -NPO after midnight -GI will sign off at this time. Please call Sunday back if there are  any acute concerns or questions.    Please call with questions or concerns.   Octavia Bruckner, PA-C [facility] [phone] (Cell)

## 2019-08-07 NOTE — Progress Notes (Signed)
[facility] Day(s): 3.   Post op day(s): 2 Days Post-Op.   Interval History: Patient seen and examined, no acute events or new complaints overnight. Patient with significant developmental delay.  Alert and cooperative.  As per nurse patient has been comfortable without any significant complaint.  Vital signs in last 24 hours: [min-max] current  Temp:  [97.9 F (36.6 C)-98.7 F (37.1 C)] 97.9 F (36.6 C) (11/08 0855) Pulse Rate:  [55-109] 62 (11/08 0855) Resp:  [18] 18 (11/08 0855) BP: (96-111)/(68-78) 96/77 (11/08 0855) SpO2:  [100 %] 100 % (11/08 0855)     Height: 5\' 4"  (162.6 cm) Weight: 81.6 kg BMI (Calculated): 30.86   Physical Exam:  Constitutional: alert, cooperative and no distress  Respiratory: breathing non-labored at rest  Cardiovascular: regular rate and sinus rhythm  Gastrointestinal: soft, non-tender, and non-distended  Labs:  CBC Latest Ref Rng & Units 08/05/2019 08/04/2019 03/12/2016  WBC 4.0 - 10.5 K/uL 12.4(H) 11.0(H) 5.6  Hemoglobin 12.0 - 15.0 g/dL 12.5 14.3 14.2  Hematocrit 36.0 - 46.0 % 41.3 45.2 44.0  Platelets 150 - 400 K/uL 163 163 222   CMP Latest Ref Rng & Units 08/06/2019 08/05/2019 08/04/2019  Glucose 70 - 99 mg/dL 106(H) 106(H) 127(H)  BUN 6 - 20 mg/dL 12 7 QUANTITY NOT SUFFICIENT, UNABLE TO PERFORM TEST  Creatinine 0.44 - 1.00 mg/dL 0.65 0.67 <0.30(L)  Sodium 135 - 145 mmol/L 143 140 141  Potassium 3.5 - 5.1 mmol/L 3.5 3.1(L) 3.8  Chloride 98 - 111 mmol/L 120(H) 115(H) 110  CO2 22 - 32 mmol/L 19(L) 16(L) 19(L)  Calcium 8.9 - 10.3 mg/dL 7.6(L) 7.6(L) 8.9  Total Protein 6.5 - 8.1 g/dL 5.4(L) 6.3(L) 7.5  Total Bilirubin 0.3 - 1.2 mg/dL 1.9(H) 6.4(H) 5.4(H)  Alkaline Phos 38 - 126 U/L 209(H) 289(H) 350(H)  AST 15 - 41 U/L 37 92(H) 120(H)  ALT 0 - 44 U/L 84(H) 139(H) 182(H)    Imaging studies: No new pertinent imaging studies   Assessment/Plan:  48 year old female with choledocholithiasis with past medical history of  cerebral palsy, diabetes, dyslipidemia, hypertension, seizures and obesity. Patient tolerated ERCP well.  There has been no nausea or vomiting.  The bilirubin decreased from 6.4 to 1.9.  She has been tolerating diet.  Case was discussed with admitting physician and nurse.  Will prepare patient for cholecystectomy tomorrow.  Patient will be n.p.o. after midnight.  She can have Lovenox today.  Arnold Long, MD

## 2019-08-07 NOTE — Progress Notes (Signed)
Pharmacy Electrolyte Monitoring Consult:  Pharmacy consulted to assist in monitoring and replacing electrolytes in this 48 y.o. female admitted on 08/04/2019 with Abdominal Pain   Labs:  Sodium (mmol/L)  Date Value  08/07/2019 142   Potassium (mmol/L)  Date Value  08/07/2019 3.4 (L)   Magnesium (mg/dL)  Date Value  08/07/2019 2.0   Calcium (mg/dL)  Date Value  08/07/2019 7.6 (L)   Albumin (g/dL)  Date Value  08/06/2019 2.0 (L)    Assessment/Plan:  K 3.4.  Pt was started on lisinopril on 11/6. Will order KCL 48mEq x 1 dose.   BMP in AM   Pharmacy will continue to follow and replace as needed.  Pernell Dupre, PharmD, BCPS 08/07/2019 9:30 AM

## 2019-08-07 NOTE — Plan of Care (Signed)

## 2019-08-08 ENCOUNTER — Inpatient Hospital Stay: Payer: Medicare Other | Admitting: Anesthesiology

## 2019-08-08 ENCOUNTER — Encounter
Admission: EM | Disposition: A | Discharge status: Home Health | Payer: Self-pay | Source: Home / Self Care | Attending: Internal Medicine

## 2019-08-08 ENCOUNTER — Encounter: Payer: Self-pay | Admitting: Anesthesiology

## 2019-08-08 HISTORY — PX: CHOLECYSTECTOMY: SHX55

## 2019-08-08 LAB — BASIC METABOLIC PANEL
Anion gap: 7 (ref 5–15)
BUN: 10 mg/dL (ref 6–20)
CO2: 20 mmol/L — ABNORMAL LOW (ref 22–32)
Calcium: 8.1 mg/dL — ABNORMAL LOW (ref 8.9–10.3)
Chloride: 113 mmol/L — ABNORMAL HIGH (ref 98–111)
Creatinine, Ser: 0.68 mg/dL (ref 0.44–1.00)
GFR calc Af Amer: >60 mL/min (ref >60)
GFR calc non Af Amer: >60 mL/min (ref >60)
Glucose, Bld: 87 mg/dL (ref 70–99)
Potassium: 3.9 mmol/L (ref 3.5–5.1)
Sodium: 140 mmol/L (ref 135–145)

## 2019-08-08 LAB — GLUCOSE, CAPILLARY
Glucose-Capillary: 71 mg/dL (ref 70–99)
Glucose-Capillary: 104 mg/dL — ABNORMAL HIGH (ref 70–99)

## 2019-08-08 SURGERY — LAPAROSCOPIC CHOLECYSTECTOMY
Anesthesia: General

## 2019-08-08 MED ORDER — OXYCODONE HCL 5 MG/5ML PO SOLN: 5.0000 mg | Freq: Once | ORAL | Status: DC | PRN

## 2019-08-08 MED ORDER — LABETALOL HCL 5 MG/ML IV SOLN
INTRAVENOUS | Status: AC
  Filled 2019-08-08: qty 4

## 2019-08-08 MED ORDER — ACETAMINOPHEN 10 MG/ML IV SOLN
INTRAVENOUS | Status: DC | PRN
  Administered 2019-08-08: 1000 mg via INTRAVENOUS

## 2019-08-08 MED ORDER — ROCURONIUM BROMIDE 50 MG/5ML IV SOLN
INTRAVENOUS | Status: AC
  Filled 2019-08-08: qty 1

## 2019-08-08 MED ORDER — FENTANYL CITRATE (PF) 100 MCG/2ML IJ SOLN
INTRAMUSCULAR | Status: DC | PRN
  Administered 2019-08-08: 25 ug via INTRAVENOUS
  Administered 2019-08-08: 50 ug via INTRAVENOUS
  Administered 2019-08-08: 100 ug via INTRAVENOUS
  Administered 2019-08-08: 25 ug via INTRAVENOUS

## 2019-08-08 MED ORDER — DEXAMETHASONE SODIUM PHOSPHATE 10 MG/ML IJ SOLN
INTRAMUSCULAR | Status: DC | PRN
  Administered 2019-08-08: 10 mg via INTRAVENOUS

## 2019-08-08 MED ORDER — BUPIVACAINE LIPOSOME 1.3 % IJ SUSP
INTRAMUSCULAR | Status: AC
  Filled 2019-08-08: qty 20

## 2019-08-08 MED ORDER — BUPIVACAINE-EPINEPHRINE 0.25% -1:200000 IJ SOLN
INTRAMUSCULAR | Status: DC | PRN
  Administered 2019-08-08: 30 mL

## 2019-08-08 MED ORDER — DEXAMETHASONE SODIUM PHOSPHATE 10 MG/ML IJ SOLN
INTRAMUSCULAR | Status: AC
  Filled 2019-08-08: qty 1

## 2019-08-08 MED ORDER — FENTANYL CITRATE (PF) 100 MCG/2ML IJ SOLN
INTRAMUSCULAR | Status: AC
  Filled 2019-08-08: qty 2

## 2019-08-08 MED ORDER — ONDANSETRON HCL 4 MG/2ML IJ SOLN
INTRAMUSCULAR | Status: AC
  Filled 2019-08-08: qty 2

## 2019-08-08 MED ORDER — LIDOCAINE HCL (CARDIAC) PF 100 MG/5ML IV SOSY
PREFILLED_SYRINGE | INTRAVENOUS | Status: DC | PRN
  Administered 2019-08-08: 60 mg via INTRAVENOUS

## 2019-08-08 MED ORDER — LABETALOL HCL 5 MG/ML IV SOLN
INTRAVENOUS | Status: DC | PRN
  Administered 2019-08-08: 5 mg via INTRAVENOUS

## 2019-08-08 MED ORDER — ROCURONIUM BROMIDE 100 MG/10ML IV SOLN
INTRAVENOUS | Status: DC | PRN
  Administered 2019-08-08: 20 mg via INTRAVENOUS
  Administered 2019-08-08: 50 mg via INTRAVENOUS

## 2019-08-08 MED ORDER — MORPHINE SULFATE (PF) 2 MG/ML IV SOLN
2.0000 mg | INTRAVENOUS | Status: DC | PRN
  Administered 2019-08-08 – 2019-08-09 (×2): 2 mg via INTRAVENOUS
  Filled 2019-08-08: qty 1

## 2019-08-08 MED ORDER — PROPOFOL 500 MG/50ML IV EMUL
INTRAVENOUS | Status: AC
  Filled 2019-08-08: qty 50

## 2019-08-08 MED ORDER — DEXMEDETOMIDINE HCL 200 MCG/2ML IV SOLN
INTRAVENOUS | Status: DC | PRN
  Administered 2019-08-08: 12 ug via INTRAVENOUS

## 2019-08-08 MED ORDER — FENTANYL CITRATE (PF) 100 MCG/2ML IJ SOLN
INTRAMUSCULAR | Status: AC
  Administered 2019-08-08: 21:00:00
  Filled 2019-08-08: qty 2

## 2019-08-08 MED ORDER — SUGAMMADEX SODIUM 200 MG/2ML IV SOLN
INTRAVENOUS | Status: DC | PRN
  Administered 2019-08-08: 200 mg via INTRAVENOUS

## 2019-08-08 MED ORDER — ONDANSETRON HCL 4 MG/2ML IJ SOLN
INTRAMUSCULAR | Status: DC | PRN
  Administered 2019-08-08: 4 mg via INTRAVENOUS

## 2019-08-08 MED ORDER — MIDAZOLAM HCL 2 MG/2ML IJ SOLN
INTRAMUSCULAR | Status: AC
  Filled 2019-08-08: qty 2

## 2019-08-08 MED ORDER — BUPIVACAINE LIPOSOME 1.3 % IJ SUSP
INTRAMUSCULAR | Status: DC | PRN
  Administered 2019-08-08: 20 mL

## 2019-08-08 MED ORDER — PROPOFOL 10 MG/ML IV BOLUS
INTRAVENOUS | Status: DC | PRN
  Administered 2019-08-08: 150 mg via INTRAVENOUS

## 2019-08-08 MED ORDER — ACETAMINOPHEN 10 MG/ML IV SOLN
INTRAVENOUS | Status: AC
  Filled 2019-08-08: qty 100

## 2019-08-08 MED ORDER — FENTANYL CITRATE (PF) 100 MCG/2ML IJ SOLN
25.0000 ug | INTRAMUSCULAR | Status: DC | PRN
  Administered 2019-08-08: 25 ug via INTRAVENOUS

## 2019-08-08 MED ORDER — MIDAZOLAM HCL 2 MG/2ML IJ SOLN
INTRAMUSCULAR | Status: DC | PRN
  Administered 2019-08-08: 2 mg via INTRAVENOUS

## 2019-08-08 MED ORDER — KETOROLAC TROMETHAMINE 30 MG/ML IJ SOLN
30.0000 mg | Freq: Four times a day (QID) | INTRAMUSCULAR | Status: DC
  Administered 2019-08-08 – 2019-08-10 (×8): 30 mg via INTRAVENOUS
  Filled 2019-08-08 (×8): qty 1

## 2019-08-08 MED ORDER — OXYCODONE HCL 5 MG PO TABS: 5.0000 mg | ORAL_TABLET | Freq: Once | ORAL | Status: DC | PRN

## 2019-08-08 MED ORDER — SUGAMMADEX SODIUM 200 MG/2ML IV SOLN
INTRAVENOUS | Status: AC
  Filled 2019-08-08: qty 2

## 2019-08-08 SURGICAL SUPPLY — 53 items
APPLICATOR COTTON TIP 6 STRL (MISCELLANEOUS) ×1 IMPLANT
APPLICATOR COTTON TIP 6IN STRL (MISCELLANEOUS) ×3
APPLIER CLIP 5 13 M/L LIGAMAX5 (MISCELLANEOUS) ×3
BULB RESERV EVAC DRAIN JP 100C (MISCELLANEOUS) ×3 IMPLANT
CANISTER SUCT 1200ML W/VALVE (MISCELLANEOUS) ×3 IMPLANT
CHLORAPREP W/TINT 26 (MISCELLANEOUS) ×3 IMPLANT
CHOLANGIOGRAM CATH TAUT (CATHETERS) IMPLANT
CLIP APPLIE 5 13 M/L LIGAMAX5 (MISCELLANEOUS) ×1 IMPLANT
COVER WAND RF STERILE (DRAPES) ×3 IMPLANT
DECANTER SPIKE VIAL GLASS SM (MISCELLANEOUS) ×6 IMPLANT
DERMABOND ADVANCED (GAUZE/BANDAGES/DRESSINGS) ×2
DERMABOND ADVANCED .7 DNX12 (GAUZE/BANDAGES/DRESSINGS) ×1 IMPLANT
DRAIN CHANNEL JP 19F (MISCELLANEOUS) ×3 IMPLANT
DRAPE C-ARM XRAY 36X54 (DRAPES) ×6 IMPLANT
DRAPE INCISE IOBAN 66X45 STRL (DRAPES) ×3 IMPLANT
DRSG OPSITE POSTOP 4X6 (GAUZE/BANDAGES/DRESSINGS) ×3 IMPLANT
ELECT CAUTERY BLADE 6.4 (BLADE) ×3 IMPLANT
ELECT REM PT RETURN 9FT ADLT (ELECTROSURGICAL) ×3
ELECTRODE REM PT RTRN 9FT ADLT (ELECTROSURGICAL) ×1 IMPLANT
ENDOLOOP SUT PDS II  0 18 (SUTURE) ×2
ENDOLOOP SUT PDS II 0 18 (SUTURE) ×1 IMPLANT
GLOVE BIO SURGEON STRL SZ7 (GLOVE) ×3 IMPLANT
GOWN STRL REUS W/ TWL LRG LVL3 (GOWN DISPOSABLE) ×3 IMPLANT
GOWN STRL REUS W/TWL LRG LVL3 (GOWN DISPOSABLE) ×6
IRRIGATION STRYKERFLOW (MISCELLANEOUS) ×1 IMPLANT
IRRIGATOR STRYKERFLOW (MISCELLANEOUS) ×3
IV CATH ANGIO 12GX3 LT BLUE (NEEDLE) IMPLANT
IV NS 1000ML (IV SOLUTION) ×4
IV NS 1000ML BAXH (IV SOLUTION) ×2 IMPLANT
L-HOOK LAP DISP 36CM (ELECTROSURGICAL) ×3
LHOOK LAP DISP 36CM (ELECTROSURGICAL) ×1 IMPLANT
NEEDLE HYPO 22GX1.5 SAFETY (NEEDLE) ×3 IMPLANT
NS IRRIG 500ML POUR BTL (IV SOLUTION) ×3 IMPLANT
PACK LAP CHOLECYSTECTOMY (MISCELLANEOUS) ×3 IMPLANT
PENCIL ELECTRO HAND CTR (MISCELLANEOUS) ×3 IMPLANT
POUCH SPECIMEN RETRIEVAL 10MM (ENDOMECHANICALS) ×3 IMPLANT
SCISSORS METZENBAUM CVD 33 (INSTRUMENTS) ×3 IMPLANT
SET TUBE SMOKE EVAC HIGH FLOW (TUBING) ×3 IMPLANT
SLEEVE ENDOPATH XCEL 5M (ENDOMECHANICALS) ×6 IMPLANT
SPONGE LAP 18X18 RF (DISPOSABLE) ×3 IMPLANT
STAPLER SKIN PROX 35W (STAPLE) ×3 IMPLANT
STOPCOCK 4 WAY LG BORE MALE ST (IV SETS) IMPLANT
SUT ETHIBOND 0 MO6 C/R (SUTURE) IMPLANT
SUT ETHILON 3-0 FS-10 30 BLK (SUTURE) ×3
SUT MNCRL AB 4-0 PS2 18 (SUTURE) ×3 IMPLANT
SUT PDS AB 0 CT1 27 (SUTURE) ×6 IMPLANT
SUT VICRYL 0 AB UR-6 (SUTURE) ×6 IMPLANT
SUTURE EHLN 3-0 FS-10 30 BLK (SUTURE) ×1 IMPLANT
SYR 20ML LL LF (SYRINGE) ×3 IMPLANT
SYS LAPSCP GELPORT 120MM (MISCELLANEOUS) ×3
SYSTEM LAPSCP GELPORT 120MM (MISCELLANEOUS) ×1 IMPLANT
TROCAR XCEL BLUNT TIP 100MML (ENDOMECHANICALS) ×3 IMPLANT
TROCAR XCEL NON-BLD 5MMX100MML (ENDOMECHANICALS) ×3 IMPLANT

## 2019-08-08 NOTE — Op Note (Signed)
Hand assisted Laparoscopic Cholecystectomy  Pre-operative Diagnosis: cholecystitis and choledocholithiasis  Post-operative Diagnosis: same  Procedure: hand assisted lap chole  Surgeon: Sterling Big, MD FACS  Anesthesia: Gen. with endotracheal tube   Findings: Severe acute Cholecystitis  Dilated CBD very close to GB wall and cystic duct. Very difficult case in need for hand assist for better exposure and tactile feedback  Estimated Blood Loss: 100cc         Drains: 19FR         Specimens: Gallbladder           Complications: none   Procedure Details  The patient was seen again in the Holding Room. The benefits, complications, treatment options, and expected outcomes were discussed with the patient. The risks of bleeding, infection, recurrence of symptoms, failure to resolve symptoms, bile duct damage, bile duct leak, retained common bile duct stone, bowel injury, any of which could require further surgery and/or ERCP, stent, or papillotomy were reviewed with the patient. The likelihood of improving the patient's symptoms with return to their baseline status is good.  The patient and/or family concurred with the proposed plan, giving informed consent.  The patient was taken to Operating Room, identified as Sandra Osborne and the procedure verified as Laparoscopic Cholecystectomy.  A Time Out was held and the above information confirmed.  Prior to the induction of general anesthesia, antibiotic prophylaxis was administered. VTE prophylaxis was in place. General endotracheal anesthesia was then administered and tolerated well. After the induction, the abdomen was prepped with Chloraprep and draped in the sterile fashion. The patient was positioned in the supine position.  Cut down technique was used to enter the abdominal cavity and a Hasson trochar was placed after two vicryl stitches were anchored to the fascia. Pneumoperitoneum was then created with CO2 and tolerated well without any  adverse changes in the patient's vital signs.  Three 5-mm ports were placed in the right upper quadrant all under direct vision. All skin incisions  were infiltrated with a local anesthetic agent before making the incision and placing the trocars.   The patient was positioned  in reverse Trendelenburg, tilted slightly to the patient's left.  The gallbladder was identified, the fundus grasped and retracted cephalad. Adhesions were lysed bluntly The gallbladder was solid and difficult to manipulate. I had issues with laparoscopic approach and I was not making much progress, I decided to convert to hand assisted approach. Incision was enlarged and gelport placed. I was able to place my hand and improve the exposure.  The infundibulum was grasped and retracted laterally, exposing the peritoneum overlying the triangle of Calot. This was then divided and exposed in a blunt fashion. An extended critical view of the cystic duct and cystic artery was obtained.  The cystic duct was clearly identified and bluntly dissected.   Artery was double clipped and divided. I placed an endoloop around the cystic duct due to its large size.  The gallbladder was taken from the gallbladder fossa in a retrograde fashion with the electrocautery. The gallbladder was removed and placed in an Endocatch bag. The liver bed was irrigated and inspected. Hemostasis was achieved with the electrocautery. Copious irrigation was utilized and was repeatedly aspirated until clear.  The gallbladder and Endocatch sac were then removed through a port site.  19 FR blake drain placed in the GB fossa and secured to the skin w 3-0 nylon.   Inspection of the right upper quadrant was performed. No bleeding, bile duct injury or leak,  or bowel injury was noted. Pneumoperitoneum was released.    Thefascia was closed with interrumpted 0 PDS suture Liposomal marcaine was applied for post op analgesia. Staples used to close the skin. The patient was then  extubated and brought to the recovery room in stable condition. Sponge, lap, and needle counts were correct at closure and at the conclusion of the case.               Sandra Hamman, MD, FACS

## 2019-08-08 NOTE — Transfer of Care (Signed)
Immediate Anesthesia Transfer of Care Note  Patient: Sandra Osborne  Procedure(s) Performed: LAPAROSCOPIC CHOLECYSTECTOMY (N/A )  Patient Location: PACU  Anesthesia Type:General  Level of Consciousness: drowsy and patient cooperative  Airway & Oxygen Therapy: Patient Spontanous Breathing and Patient connected to face mask oxygen  Post-op Assessment: Report given to RN and Post -op Vital signs reviewed and stable  Post vital signs: Reviewed and stable  Last Vitals:  Vitals Value Taken Time  BP 124/73 08/08/19 1349  Temp 36.1 C 08/08/19 1349  Pulse 73 08/08/19 1352  Resp 14 08/08/19 1352  SpO2 100 % 08/08/19 1352  Vitals shown include unvalidated device data.  Last Pain:  Vitals:   08/08/19 1055  TempSrc:   PainSc: 0-No pain         Complications: No apparent anesthesia complications

## 2019-08-08 NOTE — Addendum Note (Signed)
Addendum  created 08/08/19 1453 by Martha Clan, MD   Clinical Note Signed, Delete clinical note

## 2019-08-08 NOTE — Progress Notes (Signed)
Heart rate upper 40's  Lower 50's   Dr fitzgerald fine with this

## 2019-08-08 NOTE — Anesthesia Postprocedure Evaluation (Signed)
Anesthesia Post Note  Patient: Sandra Osborne  Procedure(s) Performed: ENDOSCOPIC RETROGRADE CHOLANGIOPANCREATOGRAPHY (ERCP) WITH PROPOFOL (N/A )  Patient location during evaluation: PACU Anesthesia Type: General Level of consciousness: awake and alert Pain management: pain level controlled Vital Signs Assessment: post-procedure vital signs reviewed and stable Respiratory status: spontaneous breathing, nonlabored ventilation, respiratory function stable and patient connected to nasal cannula oxygen Cardiovascular status: blood pressure returned to baseline and stable Postop Assessment: no apparent nausea or vomiting Anesthetic complications: no     Last Vitals:  Vitals:   08/08/19 1447 08/08/19 1451  BP: (!) 117/91   Pulse: 72 63  Resp: 15 16  Temp:  (!) 36.3 C  SpO2: 100% 97%    Last Pain:  Vitals:   08/08/19 1416  TempSrc:   PainSc: 0-No pain                 Martha Clan

## 2019-08-08 NOTE — Anesthesia Preprocedure Evaluation (Signed)
Anesthesia Evaluation  Patient identified by MRN, date of birth, ID band Patient awake    Reviewed: Allergy & Precautions, H&P , NPO status , Patient's Chart, lab work & pertinent test results  History of Anesthesia Complications Negative for: history of anesthetic complications  Airway Mallampati: III  TM Distance: <3 FB Neck ROM: limited    Dental  (+) Poor Dentition, Missing   Pulmonary neg shortness of breath, asthma ,           Cardiovascular Exercise Tolerance: Good hypertension, (-) angina(-) Past MI + dysrhythmias      Neuro/Psych Seizures -, Poorly Controlled,  PSYCHIATRIC DISORDERS negative psych ROS   GI/Hepatic negative GI ROS, Neg liver ROS, neg GERD  ,  Endo/Other  diabetes, Type 2  Renal/GU      Musculoskeletal   Abdominal   Peds  Hematology negative hematology ROS (+)   Anesthesia Other Findings Past Medical History: No date: Cerebral palsy (HCC) No date: Diabetes (HCC) No date: Dyslipidemia No date: Dysrhythmia     Comment:  tachycardia No date: History of heart disease No date: Hypertension No date: Intermittent explosive disorder No date: Intractable seizures (HCC) No date: Mental retardation No date: Obesity  Past Surgical History: 01/23/2017: COLONOSCOPY WITH PROPOFOL; N/A     Comment:  Procedure: COLONOSCOPY WITH PROPOFOL;  Surgeon: Robert T              Elliott, MD;  Location: [facility] ENDOSCOPY;  Service:               Endoscopy;  Laterality: N/A; No date: dental extractions  BMI    Body Mass Index: 30.90 kg/m      Reproductive/Obstetrics negative OB ROS                             Anesthesia Physical Anesthesia Plan  ASA: III  Anesthesia Plan: General ETT   Post-op Pain Management:    Induction: Intravenous  PONV Risk Score and Plan: Ondansetron, Dexamethasone, Midazolam and Treatment may vary due to age or medical condition  Airway Management  Planned: Oral ETT  Additional Equipment:   Intra-op Plan:   Post-operative Plan: Extubation in OR  Informed Consent: I have reviewed the patients History and Physical, chart, labs and discussed the procedure including the risks, benefits and alternatives for the proposed anesthesia with the patient or authorized representative who has indicated his/her understanding and acceptance.     Dental Advisory Given  Plan Discussed with: Anesthesiologist, CRNA and Surgeon  Anesthesia Plan Comments: (History and and phone consent from sister Sandra Osborne at [phone]     Sister was consented for risks of anesthesia including but not limited to:  - adverse reactions to medications - damage to teeth, lips or other oral mucosa - sore throat or hoarseness - Damage to heart, brain, lungs or loss of life  Sister voiced understanding.)       Anesthesia Quick Evaluation  

## 2019-08-08 NOTE — Anesthesia Postprocedure Evaluation (Deleted)
Anesthesia Post Note  Patient: Sandra Osborne  Procedure(s) Performed: ENDOSCOPIC RETROGRADE CHOLANGIOPANCREATOGRAPHY (ERCP) WITH PROPOFOL (N/A )  Patient location during evaluation: PACU Anesthesia Type: General Level of consciousness: awake and alert Pain management: pain level controlled Vital Signs Assessment: post-procedure vital signs reviewed and stable Respiratory status: spontaneous breathing, nonlabored ventilation, respiratory function stable and patient connected to nasal cannula oxygen Cardiovascular status: blood pressure returned to baseline and stable Postop Assessment: no apparent nausea or vomiting Anesthetic complications: no     Last Vitals:  Vitals:   08/08/19 1447 08/08/19 1451  BP: (!) 117/91   Pulse: 72 63  Resp: 15 16  Temp:  (!) 36.3 C  SpO2: 100% 97%    Last Pain:  Vitals:   08/08/19 1416  TempSrc:   PainSc: 0-No pain                 Ermine Spofford     

## 2019-08-08 NOTE — Plan of Care (Signed)

## 2019-08-08 NOTE — Progress Notes (Signed)
Preoperative Review   Patient is met in the preoperatively. The history is reviewed in the chart . I personally reviewed the options and rationale as well as the risks of this procedure that have been previously discussed with the family. All questions asked by the patient and/or family were answered to their satisfaction.  Patient agrees to proceed with this procedure at this time.  Diego Pabon M.D. FACS   

## 2019-08-08 NOTE — Progress Notes (Addendum)
[facility] NAME: Sandra Osborne    MR#:  [id number]  DATE OF BIRTH:  27-Apr-1971  SUBJECTIVE:  pt more awake and alert. Appears comfortable. Watching TV!  She has baseline mental retardation/cerebral palsy. No family in the room. No fever, nausea or vomiting REVIEW OF SYSTEMS:   Review of Systems  Unable to perform ROS: Mental acuity   Tolerating Diet: clear liquid Tolerating PT: patient walks by herself baseline  DRUG ALLERGIES:   Allergies  Allergen Reactions  . Depakote [Divalproex Sodium] Other (See Comments)  . Primidone Other (See Comments)    VITALS:  Blood pressure 118/88, pulse (!) 56, temperature 98.3 F (36.8 C), resp. rate 17, height 5\' 4"  (1.626 m), weight 81.6 kg, SpO2 100 %.  PHYSICAL EXAMINATION:   Physical Exam limited pt has MR  GENERAL:  48 y.o.-year-old patient lying in the bed with no acute distress. Obese  EYES: Pupils equal, round, reactive to light and accommodation. No scleral icterus. Extraocular muscles intact.  HEENT: Head atraumatic, normocephalic. Oropharynx and nasopharynx clear. Edentulous NECK:  Supple, no jugular venous distention. No thyroid enlargement, no tenderness.  LUNGS: Normal breath sounds bilaterally, no wheezing, rales, rhonchi. No use of accessory muscles of respiration.  CARDIOVASCULAR: S1, S2 normal. No murmurs, rubs, or gallops.  ABDOMEN: Soft, nontender, nondistended. Bowel sounds present. No organomegaly or mass.  EXTREMITIES: No cyanosis, clubbing or edema b/l.    PSYCHIATRIC:  patient is alert and and at baseline has mental retardation with cerebral palsy   LABORATORY PANEL:  CBC Recent Labs  Lab 08/05/19 0415  WBC 12.4*  HGB 12.5  HCT 41.3  PLT 163    Chemistries  Recent Labs  Lab 08/06/19 0355 08/07/19 0857 08/08/19 0452  NA 143 142 140  K 3.5 3.4* 3.9  CL 120* 118* 113*  CO2 19* 19* 20*  GLUCOSE 106* 92 87  BUN 12 15 10   CREATININE 0.65 0.75  0.68  CALCIUM 7.6* 7.6* 8.1*  MG  --  2.0  --   AST 37  --   --   ALT 84*  --   --   ALKPHOS 209*  --   --   BILITOT 1.9*  --   --    Cardiac Enzymes No results for input(s): TROPONINI in the last 168 hours. RADIOLOGY:  No results found. ASSESSMENT AND PLAN:  Sandra Osborne  is a 48 y.o. African-American female with a known history of cerebral palsy, type 2 diabetes mellitus, dyslipidemia and hypertension, as well as seizure disorder, presented to the emergency room with acute onset of epigastric abdominal pain with associated nausea and vomiting with one episode of diarrhea.  1. Cholelithiasis and choledocholithiasis with associated obstructive jaundice. - pain management as needed -  will continue her on IV unasyn and d/c after sugery - GI consultation with  Dr. Allen Norris appreciated -  ERCP An impacted stone was seen in the major papilla.                        - The major papilla appeared to be bulging.                        - The entire main bile duct was moderately dilated.                        - Choledocholithiasis was found.  Complete removal was                         accomplished by biliary sphincterotomy and balloon                         extraction. A biliary sphincterotomy was performed. -CLD--FLD  -Surgery Dr Everlene Farrier is planning for lap chole today.  2. Hypertension.   -continue Lopressor and Zestril.  3. Seizure disorder.   - continue Zonegran and Keppra  4. Cerebral palsy, mental retardation with behavior changes.   - continue Seroquel and trazodone.  5. DVT prophylaxis.   -Subcutaneous Lovenox.  6. Discharge plan to home with sister   sister gena Nunley is aware of the plan--spoke with her today  Case discussed with Care Management/Social Worker.  CODE STATUS: Full  DVT Prophylaxis: lovenox  TOTAL TIME TAKING CARE OF THIS PATIENT: *25* minutes.  >50% time spent on counselling and coordination of care  POSSIBLE D/C IN *1-2* DAYS, DEPENDING ON  CLINICAL CONDITION.  Note: This dictation was prepared with Dragon dictation along with smaller phrase technology. Any transcriptional errors that result from this process are unintentional.  Enedina Finner M.D on 08/08/2019 at 9:20 AM  Between 7am to 6pm - Pager - [phone]  After 6pm go to [email or web address] - password [email or web address] [facility]   CC: Primary care physician; Emogene Morgan, MDPatient ID: Sandra Osborne, female   DOB: Nov 14, 1970, 48 y.o.   MRN: [id number]

## 2019-08-08 NOTE — Progress Notes (Signed)
Pharmacy Electrolyte Monitoring Consult:  Pharmacy consulted to assist in monitoring and replacing electrolytes in this 48 y.o. female admitted on 08/04/2019 with Abdominal Pain   Labs:  Sodium (mmol/L)  Date Value  08/08/2019 140   Potassium (mmol/L)  Date Value  08/08/2019 3.9   Magnesium (mg/dL)  Date Value  08/07/2019 2.0   Calcium (mg/dL)  Date Value  08/08/2019 8.1 (L)   Albumin (g/dL)  Date Value  08/06/2019 2.0 (L)    Assessment/Plan:  K 3.9.  Pt was started on lisinopril on 11/6. No replacement at this time. BMP in AM   Pharmacy will continue to follow and replace as needed.  Nakiah Osgood A, PharmD, BCPS 08/08/2019 2:23 PM

## 2019-08-08 NOTE — Anesthesia Post-op Follow-up Note (Signed)
Anesthesia QCDR form completed.        

## 2019-08-08 NOTE — Anesthesia Procedure Notes (Signed)
Procedure Name: Intubation Date/Time: 08/08/2019 11:55 AM Performed by: Jonna Clark, CRNA Pre-anesthesia Checklist: Patient identified, Patient being monitored, Timeout performed, Emergency Drugs available and Suction available Patient Re-evaluated:Patient Re-evaluated prior to induction Oxygen Delivery Method: Circle system utilized Preoxygenation: Pre-oxygenation with 100% oxygen Induction Type: IV induction Ventilation: Mask ventilation without difficulty Laryngoscope Size: Miller and 2 Grade View: Grade I Tube type: Oral Tube size: 7.0 mm Number of attempts: 1 Airway Equipment and Method: Stylet Placement Confirmation: ETT inserted through vocal cords under direct vision,  positive ETCO2 and breath sounds checked- equal and bilateral Secured at: 22 cm Tube secured with: Tape Dental Injury: Teeth and Oropharynx as per pre-operative assessment

## 2019-08-09 ENCOUNTER — Encounter: Payer: Self-pay | Admitting: Gastroenterology

## 2019-08-09 LAB — COMPREHENSIVE METABOLIC PANEL
ALT: 103 U/L — ABNORMAL HIGH (ref 0–44)
AST: 93 U/L — ABNORMAL HIGH (ref 15–41)
Albumin: 2.2 g/dL — ABNORMAL LOW (ref 3.5–5.0)
Alkaline Phosphatase: 195 U/L — ABNORMAL HIGH (ref 38–126)
Anion gap: 7 (ref 5–15)
BUN: 8 mg/dL (ref 6–20)
CO2: 20 mmol/L — ABNORMAL LOW (ref 22–32)
Calcium: 8.1 mg/dL — ABNORMAL LOW (ref 8.9–10.3)
Chloride: 112 mmol/L — ABNORMAL HIGH (ref 98–111)
Creatinine, Ser: 0.73 mg/dL (ref 0.44–1.00)
GFR calc Af Amer: >60 mL/min (ref >60)
GFR calc non Af Amer: >60 mL/min (ref >60)
Glucose, Bld: 107 mg/dL — ABNORMAL HIGH (ref 70–99)
Potassium: 4.1 mmol/L (ref 3.5–5.1)
Sodium: 139 mmol/L (ref 135–145)
Total Bilirubin: 1.2 mg/dL (ref 0.3–1.2)
Total Protein: 5.6 g/dL — ABNORMAL LOW (ref 6.5–8.1)

## 2019-08-09 LAB — SURGICAL PATHOLOGY

## 2019-08-09 MED ORDER — LEVETIRACETAM 500 MG PO TABS
1000.0000 mg | ORAL_TABLET | Freq: Two times a day (BID) | ORAL | Status: DC
  Administered 2019-08-09 – 2019-08-10 (×2): 1000 mg via ORAL
  Filled 2019-08-09 (×3): qty 2

## 2019-08-09 MED ORDER — ACETAMINOPHEN 500 MG PO TABS
1000.0000 mg | ORAL_TABLET | Freq: Four times a day (QID) | ORAL | Status: DC
  Administered 2019-08-09 – 2019-08-10 (×5): 1000 mg via ORAL
  Filled 2019-08-09 (×5): qty 2

## 2019-08-09 NOTE — TOC Progression Note (Signed)
Transition of Care [facility]) - Progression Note    Patient Details  Name: Sandra Osborne MRN: [id number] Date of Birth: [birth year]  Transition of [facility]) CM/SW Contact  Shelbie Hutching, RN Phone Number: 08/09/2019, 2:15 PM  Clinical Narrative:    Plan is for patient to be ready for discharge home with home health services tomorrow.  Patient is from home with her sister, [name].  [name] is the legal guardian, home health agency choice offered, [name] has no preference and is good with [facility].  [name] with [place] given referral for nursing.  Patient will go home with a JP drain and follow up outpatient with GI in 1 week.     Expected Discharge Plan: [place] Barriers to Discharge: Continued Medical Work up  Expected Discharge Plan and Services Expected Discharge Plan: [place] In-house Referral: Clinical Social Work   Post Acute Care Choice: [place] arrangements for the past 2 months: [place]: RN [place] Agency: [place] ([place]) Date [place]: 08/09/19 Time [place]: 1415 Representative spoke with at [place]: [place] (SDOH) Interventions    Readmission Risk Interventions No flowsheet data found.

## 2019-08-09 NOTE — Progress Notes (Signed)
[facility] [place] [facility] [place] NAME: Sandra Osborne    MR#:  [id number]  DATE OF BIRTH:  04/19/1971  SUBJECTIVE:  pt more awake and alert. Appears comfortable. Watching TV!  She has baseline mental retardation/cerebral palsy. No family in the room. No fever, nausea or vomiting POD #1 lap chole REVIEW OF SYSTEMS:   Review of Systems  Unable to perform ROS: Mental acuity   Tolerating Diet: soft diet Tolerating PT: patient walks by herself baseline  DRUG ALLERGIES:   Allergies  Allergen Reactions  . Depakote [Divalproex Sodium] Other (See Comments)  . Primidone Other (See Comments)    VITALS:  Blood pressure 113/63, pulse 82, temperature 98.2 F (36.8 C), temperature source Oral, resp. rate 16, height 5\' 4"  (1.626 m), weight 81 kg, SpO2 100 %.  PHYSICAL EXAMINATION:   Physical Exam limited pt has MR  GENERAL:  48 y.o.-year-old patient lying in the bed with no acute distress. Obese  EYES: Pupils equal, round, reactive to light and accommodation. No scleral icterus. Extraocular muscles intact.  HEENT: Head atraumatic, normocephalic. Oropharynx and nasopharynx clear. Edentulous NECK:  Supple, no jugular venous distention. No thyroid enlargement, no tenderness.  LUNGS: Normal breath sounds bilaterally, no wheezing, rales, rhonchi. No use of accessory muscles of respiration.  CARDIOVASCULAR: S1, S2 normal. No murmurs, rubs, or gallops.  ABDOMEN: Soft, nontender, nondistended. Bowel sounds present. No organomegaly or mass.  Drain + EXTREMITIES: No cyanosis, clubbing or edema b/l.    PSYCHIATRIC:  patient is alert and and at baseline has mental retardation with cerebral palsy   LABORATORY PANEL:  CBC Recent Labs  Lab 08/05/19 0415  WBC 12.4*  HGB 12.5  HCT 41.3  PLT 163    Chemistries  Recent Labs  Lab 08/07/19 0857  08/09/19 0503  NA 142   < > 139  K 3.4*   < > 4.1  CL 118*   < > 112*  CO2 19*   < > 20*  GLUCOSE 92   < > 107*   BUN 15   < > 8  CREATININE 0.75   < > 0.73  CALCIUM 7.6*   < > 8.1*  MG 2.0  --   --   AST  --   --  93*  ALT  --   --  103*  ALKPHOS  --   --  195*  BILITOT  --   --  1.2   < > = values in this interval not displayed.   Cardiac Enzymes No results for input(s): TROPONINI in the last 168 hours. RADIOLOGY:  No results found. ASSESSMENT AND PLAN:  Sandra Osborne  is a 48 y.o. African-American female with a known history of cerebral palsy, type 2 diabetes mellitus, dyslipidemia and hypertension, as well as seizure disorder, presented to the emergency room with acute onset of epigastric abdominal pain with associated nausea and vomiting with one episode of diarrhea.  1. Cholelithiasis and choledocholithiasis with associated obstructive jaundice. - pain management as needed -  will continue her on IV unasyn and d/ced after sugery - GI consultation with  Dr. Allen Norris appreciated -  ERCP An impacted stone was seen in the major papilla.                        - The major papilla appeared to be bulging.                        -  The entire main bile duct was moderately dilated.                        - Choledocholithiasis was found. Complete removal was                         accomplished by biliary sphincterotomy and balloon                         extraction. A biliary sphincterotomy was performed. -CLD--FLD -->soft diet now -appreciate surgery help by Dr Everlene Farrier for lap chole. POD # 1 -patient will discharged home tomorrow with drain. Should follow-up with surgery in one week.  2. Hypertension.   -continue Lopressor and Zestril.  3. Seizure disorder.   - continue Zonegran and Keppra  4. Cerebral palsy, mental retardation with behavior changes.   - continue Seroquel and trazodone.  5. DVT prophylaxis.   -Subcutaneous Lovenox.  6. Discharge plan to home with sister Gena-   sister gena Mathieu is aware of the plan--spoke with her today she wants patient to stay one more day to ensure  she is doing okay and tolerating food.  Case manager to arrange home health RN.  Case discussed with Care Management/Social Worker.  CODE STATUS: Full  DVT Prophylaxis: lovenox  TOTAL TIME TAKING CARE OF THIS PATIENT: *25* minutes.  >50% time spent on counselling and coordination of care  POSSIBLE D/C IN *1* DAYS, DEPENDING ON CLINICAL CONDITION.  Note: This dictation was prepared with Dragon dictation along with smaller phrase technology. Any transcriptional errors that result from this process are unintentional.  Enedina Finner M.D on 08/09/2019 at 1:33 PM  Between 7am to 6pm - Pager - [phone]  After 6pm go to [email or web address] - password [email or web address] [facility]   CC: Primary care physician; Emogene Morgan, MDPatient ID: Sandra Osborne, female   DOB: 1971/08/17, 48 y.o.   MRN: [id number]

## 2019-08-09 NOTE — Progress Notes (Signed)
[place] Hospital Day(s): 5.   Post op day(s): 1 Day Post-Op.   Interval History:  Patient seen and examined no acute events or new complaints overnight.  Patient's sister at bedside notes she was comfortable overnight, no major issues JP - 90 ccs Regular Diet, + BM yesterday  Vital signs in last 24 hours: [min-max] current  Temp:  [97 F (36.1 C)-98.2 F (36.8 C)] 98.2 F (36.8 C) (11/09 2346) Pulse Rate:  [48-76] 76 (11/09 2346) Resp:  [10-20] 16 (11/09 2346) BP: (92-130)/(68-107) 128/76 (11/10 0029) SpO2:  [97 %-100 %] 100 % (11/09 2346) Weight:  [81 kg] 81 kg (11/09 1055)     Height: 5\' 4"  (162.6 cm) Weight: 81 kg BMI (Calculated): 30.64   Intake/Output last 2 shifts:  11/09 0701 - 11/10 0700 In: 1000 [I.V.:800; IV Piggyback:200] Out: 190 [Drains:90; Blood:100]   Physical Exam:  Constitutional: alert, cooperative and no distress  Respiratory: breathing non-labored at rest  Cardiovascular: regular rate and sinus rhythm  Gastrointestinal: soft, non-tender, and non-distended. JP in RUQ with serosanguinous fluid in bulb Integumentary: Midline minilaparotomy and laparoscopic incisions are CDI with staples and honeycomb dressing  Labs:  CBC Latest Ref Rng & Units 08/05/2019 08/04/2019 03/12/2016  WBC 4.0 - 10.5 K/uL 12.4(H) 11.0(H) 5.6  Hemoglobin 12.0 - 15.0 g/dL 12.5 14.3 14.2  Hematocrit 36.0 - 46.0 % 41.3 45.2 44.0  Platelets 150 - 400 K/uL 163 163 222   CMP Latest Ref Rng & Units 08/09/2019 08/08/2019 08/07/2019  Glucose 70 - 99 mg/dL 107(H) 87 92  BUN 6 - 20 mg/dL 8 10 15   Creatinine 0.44 - 1.00 mg/dL 0.73 0.68 0.75  Sodium 135 - 145 mmol/L 139 140 142  Potassium 3.5 - 5.1 mmol/L 4.1 3.9 3.4(L)  Chloride 98 - 111 mmol/L 112(H) 113(H) 118(H)  CO2 22 - 32 mmol/L 20(L) 20(L) 19(L)  Calcium 8.9 - 10.3 mg/dL 8.1(L) 8.1(L) 7.6(L)  Total Protein 6.5 - 8.1 g/dL 5.6(L) - -  Total Bilirubin 0.3 - 1.2 mg/dL 1.2 - -  Alkaline Phos  38 - 126 U/L 195(H) - -  AST 15 - 41 U/L 93(H) - -  ALT 0 - 44 U/L 103(H) - -     Imaging studies: No new pertinent imaging studies   Assessment/Plan:  48 y.o. female overall doing well 1 Day Post-Op s/p hand assisted laparoscopic cholecystectomy for acute cholecystitis, complicated by pertinent comorbidities including cognitive impairment.   - pain control prn  - okay to stop ABx   - monitor JP output; record daily   - monitor abdominal examination   - from surgical standpoint okay for discharge home if pain controlled, follow up in 1 week with [place], keep JP at discharge   All of the above findings and recommendations were discussed with the patient's family (sister at bedside, and the medical team, and all of their questions were answered to their expressed satisfaction.  -- Edison Simon, PA-C [facility] 08/09/2019, 7:26 AM [phone] M-F: 7am - 4pm

## 2019-08-09 NOTE — Anesthesia Postprocedure Evaluation (Signed)
Anesthesia Post Note  Patient: Sandra Osborne  Procedure(s) Performed: LAPAROSCOPIC CHOLECYSTECTOMY (N/A )  Patient location during evaluation: PACU Anesthesia Type: General Level of consciousness: awake and alert Pain management: pain level controlled Vital Signs Assessment: post-procedure vital signs reviewed and stable Respiratory status: spontaneous breathing, nonlabored ventilation, respiratory function stable and patient connected to nasal cannula oxygen Cardiovascular status: blood pressure returned to baseline and stable Postop Assessment: no apparent nausea or vomiting Anesthetic complications: no     Last Vitals:  Vitals:   08/09/19 0029 08/09/19 0915  BP: 128/76 113/63  Pulse:  82  Resp:    Temp:  36.8 C  SpO2:  100%    Last Pain:  Vitals:   08/09/19 0915  TempSrc: Oral  PainSc: 1                  Precious Haws [place]it[place]

## 2019-08-09 NOTE — Progress Notes (Signed)
Pharmacy Electrolyte Monitoring Consult:  Pharmacy consulted to assist in monitoring and replacing electrolytes in this 48 y.o. female admitted on 08/04/2019 with Abdominal Pain   Labs:  Sodium (mmol/L)  Date Value  08/09/2019 139   Potassium (mmol/L)  Date Value  08/09/2019 4.1   Magnesium (mg/dL)  Date Value  08/07/2019 2.0   Calcium (mg/dL)  Date Value  08/09/2019 8.1 (L)   Albumin (g/dL)  Date Value  08/09/2019 2.2 (L)    Assessment/Plan:  K 4.1.  Pt was started on lisinopril on 11/6. No replacement at this time. BMP in AM   Pharmacy will continue to follow and replace as needed.  Nakayla Rorabaugh A, PharmD, BCPS 08/09/2019 9:09 AM

## 2019-08-09 NOTE — Care Management Important Message (Signed)
Important Message  Patient Details  Name: Sandra Osborne MRN: [id number] Date of Birth: 08/20/1971   Medicare Important Message Given:  Yes     Juliann Pulse A Debbi Strandberg 08/09/2019, 11:32 AM

## 2019-08-10 DIAGNOSIS — R52 Pain, unspecified: Secondary | ICD-10-CM

## 2019-08-10 LAB — BASIC METABOLIC PANEL
Anion gap: 7 (ref 5–15)
BUN: 8 mg/dL (ref 6–20)
CO2: 21 mmol/L — ABNORMAL LOW (ref 22–32)
Calcium: 7.8 mg/dL — ABNORMAL LOW (ref 8.9–10.3)
Chloride: 111 mmol/L (ref 98–111)
Creatinine, Ser: 0.76 mg/dL (ref 0.44–1.00)
GFR calc Af Amer: >60 mL/min (ref >60)
GFR calc non Af Amer: >60 mL/min (ref >60)
Glucose, Bld: 94 mg/dL (ref 70–99)
Potassium: 3.7 mmol/L (ref 3.5–5.1)
Sodium: 139 mmol/L (ref 135–145)

## 2019-08-10 NOTE — Discharge Summary (Signed)
Physician Discharge Summary  Sandra Osborne:[id number] DOB: [birth year] DOA: 08/04/2019  PCP: Donnie Coffin, MD  Admit date: 08/04/2019 Discharge date: 08/10/2019  Admitted From: home Disposition:  home  Recommendations for Outpatient Follow-up:  1. Follow up with PCP in 1-2 weeks 2. Follow-up with general surgery as scheduled  Home Health: RN Equipment/Devices: None  Discharge Condition: Stable CODE STATUS: Full code Diet recommendation: Regular diet  HPI: Per admitting MD, Sandra Osborne  is a 48 y.o. African-American female with a known history of cerebral palsy, type 2 diabetes mellitus, dyslipidemia and hypertension, as well as seizure disorder, presented to the emergency room with acute onset of epigastric abdominal pain with associated nausea and vomiting with one episode of diarrhea.  No bilious vomitus or hematemesis.  No reported fever or chills.  Her family member was given the history as the patient has a fairly poor historian due to cerebral palsy.  No chest pain or palpitations. Upon presentation to the emergency room, blood pressure was 132/107 with a pulse of 57 otherwise normal vital signs.  Labs revealed elevated AST and ALT as well as alk phos with a lipase of 40 and total bilirubin of 5.4 with minimal leukocytosis.  COVID-19 test is pending.  Right upper ultrasound revealed multiple gallbladder stones with no features to suggest acute cholecystitis as well as dilated common bile duct measuring up to 19 mm.  Dr. Allen Norris was contacted and is aware about the patient. The patient was given IV Zosyn, hydration with IV normal saline and 2 mg of IV morphine sulfate.  She will be admitted to medical bed for further evaluation and management.  Hospital Course: Cholelithiasis and choledocholithiasis with obstructive jaundice -General surgery as well as gastroenterology were consulted to follow patient while hospitalized.  She underwent an ERCP which showed an impacted stone in  the major papilla, status post biliary sphincterotomy and balloon extraction resulted in complete removal.  General surgery took patient to the OR as well for laparoscopic cholecystectomy on 11/9.  She recovered well postop, still has a drain in place but will be discharged home in stable condition.  Follow-up with general surgery as an outpatient.  She was on antibiotics up until her cholecystectomy and these have been discontinued once the surgery was done.  She has remained afebrile. Hypertension-continue home medications Seizure disorder-continue home medications Cerebral palsy, mental retardation-continue home medications  Discharge Instructions  Allergies as of 08/10/2019      Reactions   Depakote [divalproex Sodium] Other (See Comments)   Primidone Other (See Comments)      Medication List    TAKE these medications   cetirizine 10 MG tablet Commonly known as: ZYRTEC Take 10 mg by mouth daily.   docusate sodium 100 MG capsule Commonly known as: COLACE Take 100 mg by mouth daily.   fexofenadine 180 MG tablet Commonly known as: ALLEGRA Take 180 mg by mouth daily as needed.   Keppra 500 MG tablet Generic drug: levETIRAcetam TAKE 2 TABLETS BY MOUTH TWICE DAILY   lovastatin 20 MG tablet Commonly known as: MEVACOR Take 20 mg by mouth at bedtime.   medroxyPROGESTERone 150 MG/ML injection Commonly known as: DEPO-PROVERA Inject 150 mg into the muscle every 3 (three) months.   metoprolol tartrate 50 MG tablet Commonly known as: LOPRESSOR Take 50 mg by mouth daily.   Zonegran 100 MG capsule Generic drug: zonisamide TAKE 3 CAPSULES BY MOUTH TWICE DAILY      Follow-up Information    Sugarmill Osborne, [place]  F, MD On 08/17/2019.   Specialty: General Surgery Why: s/p hand assisted cholecystectmy, has JP drain at [address] information: [address] [place] [address] [phone]        Donnie Coffin, MD. Schedule an appointment as soon as possible for  a visit in 4 week(s).   Specialty: Family Medicine Contact information: [place] [address] [phone]           Consultations:  Gastroenterology  General surgery  Procedures/Studies:  ERCP  Laparoscopic cholecystectomy  Mr 3d Recon At Scanner  Result Date: 08/05/2019 CLINICAL DATA:  Evaluate gallstones and dilated common bile duct. EXAM: MRI ABDOMEN WITHOUT AND WITH CONTRAST (INCLUDING MRCP) TECHNIQUE: Multiplanar multisequence MR imaging of the abdomen was performed both before and after the administration of intravenous contrast. Heavily T2-weighted images of the biliary and pancreatic ducts were obtained, and three-dimensional MRCP images were rendered by post processing. CONTRAST:  14m GADAVIST GADOBUTROL 1 MMOL/ML IV SOLN COMPARISON:  Ultrasound 08/04/2019 FINDINGS: Lower chest: No pleural effusion. Hepatobiliary: There is marked diffuse intrahepatic and common bile duct dilatation. The common bile duct measures 2.1 cm in maximum diameter. Debris is seen layering within the distal common bile duct. At the ampulla there is an impacted stone which measures 1.1 cm, image 32/10 and image 17/3. Multiple stones identified within the gallbladder these measure up to 1.8 cm. Diffuse low signal throughout the wall of the gallbladder is noted which may reflect gallbladder wall calcifications. Pancreas: There is diffuse edema of the pancreas compatible with acute pancreatitis. The main pancreatic duct is increased in caliber measuring up to 6 mm. No evidence of pancreatic necrosis or pseudocyst formation identified at this time. Spleen:  Within normal limits in size and appearance. Adrenals/Urinary Tract: Normal appearance of the adrenal glands. The kidneys are unremarkable. No mass or hydronephrosis. Stomach/Bowel: Visualized portions within the abdomen are unremarkable. Vascular/Lymphatic: No pathologically enlarged lymph nodes identified. No abdominal aortic  aneurysm demonstrated. Other: There is a small volume of free fluid identified within the right upper quadrant of the abdomen. Musculoskeletal: No suspicious bone lesions identified. IMPRESSION: 1. Acute pancreatitis. No findings to suggest pseudocyst or pancreatic necrosis. 2. Gallstones. Marked dilatation of the intrahepatic bile ducts and common bile ducts noted. Within the distal common bile duct there is an impacted stone at the level of the ampulla measuring 1.1 cm. 3. These results will be called to the ordering clinician or representative by the Radiologist Assistant, and communication documented in the PACS or zVision Dashboard. Electronically Signed   By: TKerby MoorsM.D.   On: 08/05/2019 10:02   Dg C-arm 1-60 Min-no Report  Result Date: 08/05/2019 Fluoroscopy was utilized by the requesting physician.  No radiographic interpretation.   Mr Abdomen Mrcp WMoise BoringContast  Result Date: 08/05/2019 CLINICAL DATA:  Evaluate gallstones and dilated common bile duct. EXAM: MRI ABDOMEN WITHOUT AND WITH CONTRAST (INCLUDING MRCP) TECHNIQUE: Multiplanar multisequence MR imaging of the abdomen was performed both before and after the administration of intravenous contrast. Heavily T2-weighted images of the biliary and pancreatic ducts were obtained, and three-dimensional MRCP images were rendered by post processing. CONTRAST:  861mGADAVIST GADOBUTROL 1 MMOL/ML IV SOLN COMPARISON:  Ultrasound 08/04/2019 FINDINGS: Lower chest: No pleural effusion. Hepatobiliary: There is marked diffuse intrahepatic and common bile duct dilatation. The common bile duct measures 2.1 cm in maximum diameter. Debris is seen layering within the distal common bile duct. At the ampulla there is an impacted stone  which measures 1.1 cm, image 32/10 and image 17/3. Multiple stones identified within the gallbladder these measure up to 1.8 cm. Diffuse low signal throughout the wall of the gallbladder is noted which may reflect gallbladder wall  calcifications. Pancreas: There is diffuse edema of the pancreas compatible with acute pancreatitis. The main pancreatic duct is increased in caliber measuring up to 6 mm. No evidence of pancreatic necrosis or pseudocyst formation identified at this time. Spleen:  Within normal limits in size and appearance. Adrenals/Urinary Tract: Normal appearance of the adrenal glands. The kidneys are unremarkable. No mass or hydronephrosis. Stomach/Bowel: Visualized portions within the abdomen are unremarkable. Vascular/Lymphatic: No pathologically enlarged lymph nodes identified. No abdominal aortic aneurysm demonstrated. Other: There is a small volume of free fluid identified within the right upper quadrant of the abdomen. Musculoskeletal: No suspicious bone lesions identified. IMPRESSION: 1. Acute pancreatitis. No findings to suggest pseudocyst or pancreatic necrosis. 2. Gallstones. Marked dilatation of the intrahepatic bile ducts and common bile ducts noted. Within the distal common bile duct there is an impacted stone at the level of the ampulla measuring 1.1 cm. 3. These results will be called to the ordering clinician or representative by the Radiologist Assistant, and communication documented in the PACS or zVision Dashboard. Electronically Signed   By: Kerby Moors M.D.   On: 08/05/2019 10:02   US Abdomen Limited Ruq  Result Date: 08/04/2019 CLINICAL DATA:  Right upper quadrant pain EXAM: ULTRASOUND ABDOMEN LIMITED RIGHT UPPER QUADRANT COMPARISON:  None. FINDINGS: Gallbladder: Diffuse shadowing within the gallbladder presumably due to a large shadowing stone or multiple shadowing stones. Negative sonographic Murphy. Normal wall thickness of 2.9 mm Common bile duct: Diameter: 19.4 mm Liver: No focal lesion identified. Within normal limits in parenchymal echogenicity. Portal vein is patent on color Doppler imaging with normal direction of blood flow towards the liver. Other: None. IMPRESSION: 1. Shadowing at the  gallbladder fossa presumably due to large or multiple stones in the gallbladder. No other features to suggest an acute cholecystitis 2. Dilated common bile duct measuring up to 19 mm. Recommend correlation with LFTs and consider further evaluation with MRCP Electronically Signed   By: Donavan Foil M.D.   On: 08/04/2019 21:38     Subjective: -No pain, tolerating breakfast, no nausea or vomiting  Discharge Exam: BP 125/84 (BP Location: Left Arm)    Pulse 81    Temp 98.4 F (36.9 C)    Resp 20    Ht _0  (1.626 m)    Wt 81 kg    SpO2 100%    BMI 30.65 kg/m   General: Pt is alert, awake, not in acute distress Cardiovascular: RRR, S1/S2 +, no rubs, no gallops Respiratory: CTA bilaterally, no wheezing, no rhonchi Abdominal: Soft, NT, ND, bowel sounds + Extremities: no edema, no cyanosis    The results of significant diagnostics from this hospitalization (including imaging, microbiology, ancillary and laboratory) are listed below for reference.     Microbiology: Recent Results (from the past 240 hour(s))  SARS CORONAVIRUS 2 (TAT 6-24 HRS) Nasopharyngeal Nasopharyngeal Swab     Status: None   Collection Time: 08/05/19 12:54 AM   Specimen: Nasopharyngeal Swab  Result Value Ref Range Status   SARS Coronavirus 2 NEGATIVE NEGATIVE Final    Comment: (NOTE) SARS-CoV-2 target nucleic acids are NOT DETECTED. The SARS-CoV-2 RNA is generally detectable in upper and lower respiratory specimens during the acute phase of infection. Negative results do not preclude SARS-CoV-2 infection, do not rule out  co-infections with other pathogens, and should not be used as the sole basis for treatment or other patient management decisions. Negative results must be combined with clinical observations, patient history, and epidemiological information. The expected result is Negative. Fact Sheet for Patients: SugarRoll.be Fact Sheet for Healthcare  Providers: https://www.Osborne-mathews.com/ This test is not yet approved or cleared by the [place] FDA and  has been authorized for detection and/or diagnosis of SARS-CoV-2 by FDA under an Emergency Use Authorization (EUA). This EUA will remain  in effect (meaning this test can be used) for the duration of the COVID-19 declaration under Section 56 4(b)(1) of the Act, 21 U.S.C. section 360bbb-3(b)(1), unless the authorization is terminated or revoked sooner. Performed at [facility], [place] [address] [place], [place] [address]      Labs: Basic Metabolic Panel: Recent Labs  Lab 08/05/19 0415 08/06/19 0355 08/07/19 0857 08/08/19 0452 08/09/19 0503 08/10/19 0336  NA 140 143 142 140 139 139  K 3.1* 3.5 3.4* 3.9 4.1 3.7  CL 115* 120* 118* 113* 112* 111  CO2 16* 19* 19* 20* 20* 21*  GLUCOSE 106* 106* 92 87 107* 94  BUN _0 CREATININE 0.67 0.65 0.75 0.68 0.73 0.76  CALCIUM 7.6* 7.6* 7.6* 8.1* 8.1* 7.8*  MG 1.8  --  2.0  --   --   --    Liver Function Tests: Recent Labs  Lab 08/04/19 1752 08/05/19 0415 08/06/19 0355 08/09/19 0503  AST 120* 92* 37 93*  ALT 182* 139* 84* 103*  ALKPHOS 350* 289* 209* 195*  BILITOT 5.4* 6.4* 1.9* 1.2  PROT 7.5 6.3* 5.4* 5.6*  ALBUMIN 3.1* 2.7* 2.0* 2.2*   CBC: Recent Labs  Lab 08/04/19 1752 08/05/19 0415  WBC 11.0* 12.4*  HGB 14.3 12.5  HCT 45.2 41.3  MCV 91.3 97.2  PLT 163 163   CBG: Recent Labs  Lab 08/05/19 1302 08/05/19 1407 08/08/19 1104 08/08/19 1402  GLUCAP 78 112* 71 104*   Hgb A1c No results for input(s): HGBA1C in the last 72 hours. Lipid Profile No results for input(s): CHOL, HDL, LDLCALC, TRIG, CHOLHDL, LDLDIRECT in the last 72 hours. Thyroid function studies No results for input(s): TSH, T4TOTAL, T3FREE, THYROIDAB in the last 72 hours.  Invalid input(s): FREET3 Urinalysis    Component Value Date/Time   COLORURINE YELLOW (A) 08/07/2019 0025   APPEARANCEUR HAZY (A)  08/07/2019 0025   LABSPEC 1.027 08/07/2019 0025   PHURINE 5.0 08/07/2019 0025   GLUCOSEU NEGATIVE 08/07/2019 0025   HGBUR SMALL (A) 08/07/2019 0025   BILIRUBINUR NEGATIVE 08/07/2019 0025   KETONESUR NEGATIVE 08/07/2019 0025   PROTEINUR NEGATIVE 08/07/2019 0025   NITRITE NEGATIVE 08/07/2019 0025   LEUKOCYTESUR NEGATIVE 08/07/2019 0025    FURTHER DISCHARGE INSTRUCTIONS:   Get Medicines reviewed and adjusted: Please take all your medications with you for your next visit with your Primary MD   Laboratory/radiological data: Please request your Primary MD to go over all hospital tests and procedure/radiological results at the follow up, please ask your Primary MD to get all Hospital records sent to his/her office.   In some cases, they will be blood work, cultures and biopsy results pending at the time of your discharge. Please request that your primary care M.D. goes through all the records of your hospital data and follows up on these results.   Also Note the following: If you experience worsening of your admission symptoms, develop shortness of breath, life threatening emergency,  suicidal or homicidal thoughts you must seek medical attention immediately by calling 911 or calling your MD immediately  if symptoms less severe.   You must read complete instructions/literature along with all the possible adverse reactions/side effects for all the Medicines you take and that have been prescribed to you. Take any new Medicines after you have completely understood and accpet all the possible adverse reactions/side effects.    Do not drive when taking Pain medications or sleeping medications (Benzodaizepines)   Do not take more than prescribed Pain, Sleep and Anxiety Medications. It is not advisable to combine anxiety,sleep and pain medications without talking with your primary care practitioner   Special Instructions: If you have smoked or chewed Tobacco  in the last 2 yrs please stop smoking,  stop any regular Alcohol  and or any Recreational drug use.   Wear Seat belts while driving.   Please note: You were cared for by a hospitalist during your hospital stay. Once you are discharged, your primary care physician will handle any further medical issues. Please note that NO REFILLS for any discharge medications will be authorized once you are discharged, as it is imperative that you return to your primary care physician (or establish a relationship with a primary care physician if you do not have one) for your post hospital discharge needs so that they can reassess your need for medications and monitor your lab values.  Time coordinating discharge: 35 minutes  SIGNED:  Marzetta Board, MD, PhD 08/10/2019, 1:15 PM

## 2019-08-10 NOTE — Progress Notes (Signed)
Pharmacy Electrolyte Monitoring Consult:  Pharmacy consulted to assist in monitoring and replacing electrolytes in this 48 y.o. female admitted on 08/04/2019 with Abdominal Pain   Labs:  Sodium (mmol/L)  Date Value  08/10/2019 139   Potassium (mmol/L)  Date Value  08/10/2019 3.7   Magnesium (mg/dL)  Date Value  08/07/2019 2.0   Calcium (mg/dL)  Date Value  08/10/2019 7.8 (L)   Albumin (g/dL)  Date Value  08/09/2019 2.2 (L)    Assessment/Plan:  K 3.7.  Pt was started on lisinopril on 11/6. No replacement at this time. Will sign off as pt has not needed supplementation for a few days.     Rocky Morel, PharmD, BCPS 08/10/2019 7:04 AM

## 2019-08-10 NOTE — TOC Transition Note (Signed)
Transition of Care [facility]) - CM/SW Discharge Note   Patient Details  Name: MIYUKI RZASA MRN: [id number] Date of Birth: 03-09-1971  Transition of Care [facility] CM/SW Contact:  Shelbie Hutching, RN Phone Number: 08/10/2019, 3:32 PM   Clinical Narrative:    Patient ready for discharge home with home health services through [facility]. Patient's sister will pick up at discharge.     Final next level of care: Home w Home Health Services Barriers to Discharge: Barriers Resolved   Patient Goals and CMS Choice   CMS Medicare.gov Compare Post Acute Care list provided to:: Patient Represenative (must comment) Choice offered to / list presented to : [place] / [place]  Discharge Placement                       Discharge Plan and Services In-house Referral: Clinical Social Work   Post Acute Care Choice: Home Health                    HH Arranged: RN [facility] Agency: [place] ([place]) Date [place]: 08/10/19 Time [place]: 1400 Representative spoke with at [place]: [place] (SDOH) Interventions     Readmission Risk Interventions No flowsheet data found.

## 2019-08-10 NOTE — Discharge Instructions (Signed)
Follow with Tomasa Hose A, MD as needed Follow up with surgery as scheduled  Please get a complete blood count and chemistry panel checked by your Primary MD at your next visit, and again as instructed by your Primary MD. Please get your medications reviewed and adjusted by your Primary MD.  Please request your Primary MD to go over all Hospital Tests and Procedure/Radiological results at the follow up, please get all Hospital records sent to your Prim MD by signing hospital release before you go home.  In some cases, there will be blood work, cultures and biopsy results pending at the time of your discharge. Please request that your primary care M.D. goes through all the records of your hospital data and follows up on these results.  If you had Pneumonia of Lung problems at the Hospital: Please get a 2 view Chest X ray done in 6-8 weeks after hospital discharge or sooner if instructed by your Primary MD.  If you have Congestive Heart Failure: Please call your Cardiologist or Primary MD anytime you have any of the following symptoms:  1) 3 pound weight gain in 24 hours or 5 pounds in 1 week  2) shortness of breath, with or without a dry hacking cough  3) swelling in the hands, feet or stomach  4) if you have to sleep on extra pillows at night in order to breathe  Follow cardiac low salt diet and 1.5 lit/day fluid restriction.  If you have diabetes Accuchecks 4 times/day, Once in AM empty stomach and then before each meal. Log in all results and show them to your primary doctor at your next visit. If any glucose reading is under 80 or above 300 call your primary MD immediately.  If you have Seizure/Convulsions/Epilepsy: Please do not drive, operate heavy machinery, participate in activities at heights or participate in high speed sports until you have seen by Primary MD or a Neurologist and advised to do so again. Per [facility] statutes, patients with seizures are not allowed to  drive until they have been seizure-free for six months.  Use caution when using heavy equipment or power tools. Avoid working on ladders or at heights. Take showers instead of baths. Ensure the water temperature is not too high on the home water heater. Do not go swimming alone. Do not lock yourself in a room alone (i.e. bathroom). When caring for infants or small children, sit down when holding, feeding, or changing them to minimize risk of injury to the child in the event you have a seizure. Maintain good sleep hygiene. Avoid alcohol.   If you had Gastrointestinal Bleeding: Please ask your Primary MD to check a complete blood count within one week of discharge or at your next visit. Your endoscopic/colonoscopic biopsies that are pending at the time of discharge, will also need to followed by your Primary MD.  Get Medicines reviewed and adjusted. Please take all your medications with you for your next visit with your Primary MD  Please request your Primary MD to go over all hospital tests and procedure/radiological results at the follow up, please ask your Primary MD to get all Hospital records sent to his/her office.  If you experience worsening of your admission symptoms, develop shortness of breath, life threatening emergency, suicidal or homicidal thoughts you must seek medical attention immediately by calling 911 or calling your MD immediately  if symptoms less severe.  You must read complete instructions/literature along with all the possible adverse reactions/side effects  for all the Medicines you take and that have been prescribed to you. Take any new Medicines after you have completely understood and accpet all the possible adverse reactions/side effects.   Do not drive or operate heavy machinery when taking Pain medications.   Do not take more than prescribed Pain, Sleep and Anxiety Medications  Special Instructions: If you have smoked or chewed Tobacco  in the last 2 yrs please stop  smoking, stop any regular Alcohol  and or any Recreational drug use.  Wear Seat belts while driving.  Please note You were cared for by a hospitalist during your hospital stay. If you have any questions about your discharge medications or the care you received while you were in the hospital after you are discharged, you can call the unit and asked to speak with the hospitalist on call if the hospitalist that took care of you is not available. Once you are discharged, your primary care physician will handle any further medical issues. Please note that NO REFILLS for any discharge medications will be authorized once you are discharged, as it is imperative that you return to your primary care physician (or establish a relationship with a primary care physician if you do not have one) for your aftercare needs so that they can reassess your need for medications and monitor your lab values.  You can reach the hospitalist office at phone [phone] or fax [phone]   If you do not have a primary care physician, you can call [phone] for a physician referral.  Activity: As tolerated with Full fall precautions use walker/cane & assistance as needed    Diet: regular  Disposition Home

## 2019-08-10 NOTE — Progress Notes (Signed)
IV removed before discharge. Educated patients sister Gena on the JP drain, and surgical incisions, as well as discharge instructions. She said that she understood and did not have any questions.

## 2019-08-17 ENCOUNTER — Ambulatory Visit (INDEPENDENT_AMBULATORY_CARE_PROVIDER_SITE_OTHER): Payer: Self-pay | Admitting: Surgery

## 2019-08-17 ENCOUNTER — Other Ambulatory Visit: Payer: Self-pay

## 2019-08-17 ENCOUNTER — Encounter: Payer: Self-pay | Admitting: Surgery

## 2019-08-17 VITALS — BP 133/80 | HR 74 | Temp 97.9°F | Ht 62.0 in | Wt 178.0 lb

## 2019-08-17 DIAGNOSIS — Z09 Encounter for follow-up examination after completed treatment for conditions other than malignant neoplasm: Secondary | ICD-10-CM

## 2019-08-17 NOTE — Progress Notes (Signed)
S/p hals chole POD # 9 Doing well Minimal output from drain Taking PO, no fevers  PE NAD Abd: soft, staples removed and JP removed. Steri trips placed. No infection or peritonitis  A/ pDoing well No complications F/U prn

## 2019-08-17 NOTE — Patient Instructions (Signed)
Return as needed.The patient is aware to call back for any questions or concerns.  

## 2019-08-22 ENCOUNTER — Encounter: Payer: Self-pay | Admitting: Podiatry

## 2019-08-22 ENCOUNTER — Other Ambulatory Visit: Payer: Self-pay | Admitting: Neurology

## 2019-08-22 ENCOUNTER — Ambulatory Visit (INDEPENDENT_AMBULATORY_CARE_PROVIDER_SITE_OTHER): Payer: Medicare Other | Admitting: Podiatry

## 2019-08-22 ENCOUNTER — Telehealth: Payer: Self-pay

## 2019-08-22 ENCOUNTER — Other Ambulatory Visit: Payer: Self-pay

## 2019-08-22 DIAGNOSIS — M2010 Hallux valgus (acquired), unspecified foot: Secondary | ICD-10-CM

## 2019-08-22 DIAGNOSIS — M79676 Pain in unspecified toe(s): Secondary | ICD-10-CM | POA: Diagnosis not present

## 2019-08-22 DIAGNOSIS — B351 Tinea unguium: Secondary | ICD-10-CM | POA: Diagnosis not present

## 2019-08-22 DIAGNOSIS — M79609 Pain in unspecified limb: Secondary | ICD-10-CM

## 2019-08-22 DIAGNOSIS — M2011 Hallux valgus (acquired), right foot: Secondary | ICD-10-CM

## 2019-08-22 DIAGNOSIS — E1151 Type 2 diabetes mellitus with diabetic peripheral angiopathy without gangrene: Secondary | ICD-10-CM

## 2019-08-22 NOTE — Progress Notes (Signed)
Complaint:  Visit Type: Patient returns to my office for continued preventative foot care services. Complaint: Patient states" my nails have grown long and thick and become painful to walk and wear shoes" Patient has been diagnosed with DM with no foot complications. The patient presents for preventative foot care services. No changes to ROS.    Podiatric Exam: Vascular: dorsalis pedis and posterior tibial pulses are weakly  palpable bilateral. Capillary return is immediate.  Skin turgor WNL  Sensorium: Diminished  Semmes Weinstein monofilament test. Normal tactile sensation bilaterally. Nail Exam: Pt has thick disfigured discolored nails with subungual debris noted bilateral entire nail hallux through fifth toenails Ulcer Exam: There is no evidence of ulcer or pre-ulcerative changes or infection. Orthopedic Exam: Muscle tone and strength are WNL. No limitations in general ROM. No crepitus or effusions noted. Foot type and digits show no abnormalities. HAV  B/L Skin: No Porokeratosis. No infection or ulcers  Diagnosis:  Onychomycosis, , Pain in right toe, pain in left toes,  Diabetes with angiopathy and neuropathy.  Treatment & Plan Procedures and Treatment: Consent by patient was obtained for treatment procedures. The patient understood the discussion of treatment and procedures well. All questions were answered thoroughly reviewed. Debridement of mycotic and hypertrophic toenails, 1 through 5 bilateral and clearing of subungual debris. No ulceration, no infection noted. Initiate diabetic shoes for DPN and HAV  B/L. Return Visit-Office Procedure: Patient instructed to return to the office for a follow up visit 3 months for continued evaluation and treatment.    Gardiner Barefoot DPM

## 2019-08-22 NOTE — Telephone Encounter (Signed)
[facility] called to confirm discharge from home care due to drain being removed.

## 2019-08-23 ENCOUNTER — Other Ambulatory Visit: Payer: Self-pay | Admitting: Neurology

## 2019-08-29 NOTE — Progress Notes (Signed)
PATIENT: Sandra Osborne DOB: 15-May-1971  REASON FOR VISIT: follow up HISTORY FROM: patient  HISTORY OF PRESENT ILLNESS: Today 08/30/19  Sandra Osborne is a 48 year old female with history of cerebral palsy with a right hemiparesis and mental retardation and intractable seizures.  Her seizures are nocturnal in nature.  She may have 1 or 2 seizures a month.  She remains on relatively high doses of Keppra and Zonegran.  She was recently hospitalized for gallstones and subsequent cholecystectomy.  She is nonverbal.  Her guardian, [name], indicates that over the last month she has had a few more seizure spells maybe 3 this month at night, related to pain and discomfort from her gallbladder.  The spells are described as jerks, always nocturnally, very brief, not violent, and she never wakes up.  She is tolerating Keppra and Zonegran well.  She requires assistance with her daily activities.  She is no longer taking Seroquel, as her psychiatrist left the practice.  She is planning to establish with a new provider.  She presents today for follow-up accompanied by [name], who is her guardian and sister.  HISTORY 08/24/2018 Dr. Anne Hahn: Sandra Osborne is a 48 year old black female with a history of cerebral palsy with a right hemiparesis and mental retardation and intractable seizures.  Her seizures generally tend to be nocturnal in nature, she will have 1 or 2 seizures a month.  She remains on relatively high doses of Keppra and Zonegran.  The patient apparently is tolerating the medication fairly well.  The patient is eating well, she has not had any falls.  She has not hurt herself with any of the seizures.  The patient lives with her family who is her caretaker, she does require some assistance with bathing and dressing.  The patient comes to this office for an evaluation.   REVIEW OF SYSTEMS: Out of a complete 14 system review of symptoms, the patient complains only of the following symptoms, and all other  reviewed systems are negative.  Seizures  ALLERGIES: Allergies  Allergen Reactions  . Depakote [Divalproex Sodium] Other (See Comments)  . Primidone Other (See Comments)    HOME MEDICATIONS: Outpatient Medications Prior to Visit  Medication Sig Dispense Refill  . cetirizine (ZYRTEC) 10 MG tablet Take 10 mg by mouth daily.    Marland Kitchen docusate sodium (COLACE) 100 MG capsule Take 100 mg by mouth daily.    . fexofenadine (ALLEGRA) 180 MG tablet Take 180 mg by mouth daily as needed.    . fluticasone (FLONASE) 50 MCG/ACT nasal spray     . KEPPRA 500 MG tablet TAKE 2 TABLETS BY MOUTH TWICE DAILY (Patient taking differently: Take 1,000 mg by mouth 2 (two) times daily. ) 360 tablet 0  . lovastatin (MEVACOR) 20 MG tablet Take 20 mg by mouth at bedtime.    . medroxyPROGESTERone (DEPO-PROVERA) 150 MG/ML injection Inject 150 mg into the muscle every 3 (three) months.    . metoprolol (LOPRESSOR) 50 MG tablet Take 50 mg by mouth daily.     Marland Kitchen ZONEGRAN 100 MG capsule TAKE 3 CAPSULES BY MOUTH TWICE DAILY (Patient taking differently: Take 300 mg by mouth 2 (two) times daily. ) 540 capsule 0   No facility-administered medications prior to visit.     PAST MEDICAL HISTORY: Past Medical History:  Diagnosis Date  . Cerebral palsy (HCC)   . Diabetes (HCC)   . Dyslipidemia   . Dysrhythmia    tachycardia  . History of heart disease   .  Hypertension   . Intermittent explosive disorder   . Intractable seizures ([place])   . Mental retardation   . Obesity     PAST SURGICAL HISTORY: Past Surgical History:  Procedure Laterality Date  . CHOLECYSTECTOMY N/A 08/08/2019   Procedure: LAPAROSCOPIC CHOLECYSTECTOMY;  Surgeon: Jules Husbands, MD;  Location: [facility] ORS;  Service: General;  Laterality: N/A;  . COLONOSCOPY WITH PROPOFOL N/A 01/23/2017   Procedure: COLONOSCOPY WITH PROPOFOL;  Surgeon: Manya Silvas, MD;  Location: [facility] ENDOSCOPY;  Service: Endoscopy;  Laterality: N/A;  . dental extractions    .  ENDOSCOPIC RETROGRADE CHOLANGIOPANCREATOGRAPHY (ERCP) WITH PROPOFOL N/A 08/05/2019   Procedure: ENDOSCOPIC RETROGRADE CHOLANGIOPANCREATOGRAPHY (ERCP) WITH PROPOFOL;  Surgeon: Lucilla Lame, MD;  Location: [facility] ENDOSCOPY;  Service: Endoscopy;  Laterality: N/A;    FAMILY HISTORY: Family History  Problem Relation Age of Onset  . Lung cancer Mother   . Brain cancer Mother   . Breast cancer Neg Hx     SOCIAL HISTORY: Social History   Socioeconomic History  . Marital status: Single    Spouse name: Not on file  . Number of children: Not on file  . Years of education: Not on file  . Highest education level: Not on file  Occupational History  . Not on file  Social Needs  . Financial resource strain: Not on file  . Food insecurity    Worry: Not on file    Inability: Not on file  . Transportation needs    Medical: Not on file    Non-medical: Not on file  Tobacco Use  . Smoking status: Never Smoker  . Smokeless tobacco: Never Used  Substance and Sexual Activity  . Alcohol use: No  . Drug use: No  . Sexual activity: Not on file  Lifestyle  . Physical activity    Days per week: Not on file    Minutes per session: Not on file  . Stress: Not on file  Relationships  . Social Herbalist on phone: Not on file    Gets together: Not on file    Attends religious service: Not on file    Active member of club or organization: Not on file    Attends meetings of clubs or organizations: Not on file    Relationship status: Not on file  . Intimate partner violence    Fear of current or ex partner: Not on file    Emotionally abused: Not on file    Physically abused: Not on file    Forced sexual activity: Not on file  Other Topics Concern  . Not on file  Social History Narrative   The patient is single and lives with her sister.   The patient goes to a day program.   Patient is left-handed.   Patient drinks two caffeine drinks daily.            PHYSICAL EXAM  Vitals:    08/30/19 1103  BP: 126/70  Pulse: 85  Temp: 97.8 F (36.6 C)  TempSrc: Oral  Weight: 159 lb 12.8 oz (72.5 kg)  Height: 5\' 2"  (1.575 m)   Body mass index is 29.23 kg/m.  Generalized: Well developed, in no acute distress   Neurological examination  Mentation: Alert, is nonverbal, follows some exam commands Cranial nerve II-XII: Pupils were equal round reactive to light.  Motor: Good strength in all extremities Sensory: Sensory testing is intact to soft touch on all 4 extremities. No evidence of extinction is noted.  Coordination: Able to perform finger-nose-finger with the left hand, otherwise difficulty following commands Gait and station: Wide-based gait Reflexes: Deep tendon reflexes are symmetric  DIAGNOSTIC DATA (LABS, IMAGING, TESTING) - I reviewed patient records, labs, notes, testing and imaging myself where available.  Lab Results  Component Value Date   WBC 12.4 (H) 08/05/2019   HGB 12.5 08/05/2019   HCT 41.3 08/05/2019   MCV 97.2 08/05/2019   PLT 163 08/05/2019      Component Value Date/Time   NA 139 08/10/2019 0336   K 3.7 08/10/2019 0336   CL 111 08/10/2019 0336   CO2 21 (L) 08/10/2019 0336   GLUCOSE 94 08/10/2019 0336   BUN 8 08/10/2019 0336   CREATININE 0.76 08/10/2019 0336   CALCIUM 7.8 (L) 08/10/2019 0336   PROT 5.6 (L) 08/09/2019 0503   ALBUMIN 2.2 (L) 08/09/2019 0503   AST 93 (H) 08/09/2019 0503   ALT 103 (H) 08/09/2019 0503   ALKPHOS 195 (H) 08/09/2019 0503   BILITOT 1.2 08/09/2019 0503   GFRNONAA >60 08/10/2019 0336   GFRAA >60 08/10/2019 0336   No results found for: CHOL, HDL, LDLCALC, LDLDIRECT, TRIG, CHOLHDL No results found for: JXBJ4NHGBA1C No results found for: VITAMINB12 No results found for: TSH  ASSESSMENT AND PLAN 48 y.o. year old female  has a past medical history of Cerebral palsy (HCC), Diabetes (HCC), Dyslipidemia, Dysrhythmia, History of heart disease, Hypertension, Intermittent explosive disorder, Intractable seizures (HCC),  Mental retardation, and Obesity. here with:  1.  Intractable seizures 2.  Mental retardation  Since last seen, she has had a few more seizure episodes within the last month, likely related to pain with her gallbladder.  It is very difficult to obtain blood work.  If the episodes continue to occur, her guardian let me know.  At last visit her Zonegran and Keppra levels were therapeutic.  She will remain on Keppra 500 mg tablet, 2 tablets twice a day, Zonegran 100 mg capsule, 3 capsules twice a day.  She will continue routine follow-up with her primary doctor.  She will follow-up in 1 year or sooner if needed.  I did advise if her symptoms worsen or she develops any new symptoms she should let us know.  I spent 15 minutes with the patient. 50% of this time was spent discussing her plan of care.  Margie EgeSarah Srah Ake, AGNP-C, DNP 08/30/2019, 11:17 AM [facility] [address], [address] [place], [place] [address] [phone]

## 2019-08-30 ENCOUNTER — Other Ambulatory Visit: Payer: Self-pay

## 2019-08-30 ENCOUNTER — Ambulatory Visit: Payer: Self-pay | Admitting: Neurology

## 2019-08-30 ENCOUNTER — Ambulatory Visit (INDEPENDENT_AMBULATORY_CARE_PROVIDER_SITE_OTHER): Payer: Medicare Other | Admitting: Neurology

## 2019-08-30 ENCOUNTER — Encounter: Payer: Self-pay | Admitting: Neurology

## 2019-08-30 VITALS — BP 126/70 | HR 85 | Temp 97.8°F | Ht 62.0 in | Wt 159.8 lb

## 2019-08-30 DIAGNOSIS — G40309 Generalized idiopathic epilepsy and epileptic syndromes, not intractable, without status epilepticus: Secondary | ICD-10-CM

## 2019-08-30 MED ORDER — KEPPRA 500 MG PO TABS: ORAL_TABLET | ORAL | 3 refills | Status: DC

## 2019-08-30 MED ORDER — ZONEGRAN 100 MG PO CAPS: 300.0000 mg | ORAL_CAPSULE | Freq: Two times a day (BID) | ORAL | 3 refills | Status: DC

## 2019-08-30 NOTE — Progress Notes (Signed)
I have read the note, and I agree with the clinical assessment and plan.  Avenly Roberge K Avary Pitsenbarger   

## 2019-08-30 NOTE — Patient Instructions (Signed)
Continue current medications   I will check seizure medication levels today  Return in 1 year   Merry Christmas :)

## 2019-10-05 ENCOUNTER — Other Ambulatory Visit: Payer: Self-pay

## 2019-10-05 ENCOUNTER — Ambulatory Visit: Payer: Medicare Other | Admitting: Orthotics

## 2019-10-05 DIAGNOSIS — M2010 Hallux valgus (acquired), unspecified foot: Secondary | ICD-10-CM

## 2019-10-05 DIAGNOSIS — B351 Tinea unguium: Secondary | ICD-10-CM

## 2019-10-05 DIAGNOSIS — M2011 Hallux valgus (acquired), right foot: Secondary | ICD-10-CM

## 2019-10-05 DIAGNOSIS — E1142 Type 2 diabetes mellitus with diabetic polyneuropathy: Secondary | ICD-10-CM

## 2019-10-05 DIAGNOSIS — E1151 Type 2 diabetes mellitus with diabetic peripheral angiopathy without gangrene: Secondary | ICD-10-CM

## 2019-10-05 NOTE — Progress Notes (Signed)
Repeat 08/2018 order fore diabetic shoes; new cast done.

## 2019-11-21 ENCOUNTER — Ambulatory Visit (INDEPENDENT_AMBULATORY_CARE_PROVIDER_SITE_OTHER): Payer: Medicare Other | Admitting: Podiatry

## 2019-11-21 ENCOUNTER — Encounter: Payer: Self-pay | Admitting: Podiatry

## 2019-11-21 ENCOUNTER — Other Ambulatory Visit: Payer: Self-pay

## 2019-11-21 DIAGNOSIS — B351 Tinea unguium: Secondary | ICD-10-CM | POA: Diagnosis not present

## 2019-11-21 DIAGNOSIS — E1151 Type 2 diabetes mellitus with diabetic peripheral angiopathy without gangrene: Secondary | ICD-10-CM | POA: Diagnosis not present

## 2019-11-21 DIAGNOSIS — M79609 Pain in unspecified limb: Secondary | ICD-10-CM | POA: Diagnosis not present

## 2019-11-21 NOTE — Progress Notes (Signed)
Complaint:  Visit Type: Patient returns to my office for continued preventative foot care services. Complaint: Patient states" my nails have grown long and thick and become painful to walk and wear shoes" Patient has been diagnosed with DM with no foot complications. The patient presents for preventative foot care services. No changes to ROS.    Podiatric Exam: Vascular: dorsalis pedis and posterior tibial pulses are weakly  palpable bilateral. Capillary return is immediate.  Skin turgor WNL  Sensorium: Diminished  Semmes Weinstein monofilament test. Normal tactile sensation bilaterally. Nail Exam: Pt has thick disfigured discolored nails with subungual debris noted bilateral entire nail hallux through fifth toenails Ulcer Exam: There is no evidence of ulcer or pre-ulcerative changes or infection. Orthopedic Exam: Muscle tone and strength are WNL. No limitations in general ROM. No crepitus or effusions noted. Foot type and digits show no abnormalities. HAV  B/L Skin: No Porokeratosis. No infection or ulcers  Diagnosis:  Onychomycosis, , Pain in right toe, pain in left toes,  Diabetes with angiopathy and neuropathy.  Treatment & Plan Procedures and Treatment: Consent by patient was obtained for treatment procedures. The patient understood the discussion of treatment and procedures well. All questions were answered thoroughly reviewed. Debridement of mycotic and hypertrophic toenails, 1 through 5 bilateral and clearing of subungual debris. No ulceration, no infection noted.  Return Visit-Office Procedure: Patient instructed to return to the office for a follow up visit 3 months for continued evaluation and treatment.    Helane Gunther DPM

## 2019-12-06 ENCOUNTER — Other Ambulatory Visit: Payer: Self-pay

## 2019-12-06 ENCOUNTER — Ambulatory Visit: Payer: Medicare Other | Admitting: Orthotics

## 2019-12-06 DIAGNOSIS — M2012 Hallux valgus (acquired), left foot: Secondary | ICD-10-CM | POA: Diagnosis not present

## 2019-12-06 DIAGNOSIS — E1159 Type 2 diabetes mellitus with other circulatory complications: Secondary | ICD-10-CM | POA: Diagnosis not present

## 2019-12-06 DIAGNOSIS — M2011 Hallux valgus (acquired), right foot: Secondary | ICD-10-CM | POA: Diagnosis not present

## 2020-02-13 ENCOUNTER — Other Ambulatory Visit (HOSPITAL_COMMUNITY)
Admission: RE | Admit: 2020-02-13 | Discharge: 2020-02-13 | Disposition: A | Discharge status: Home / Self Care | Payer: Medicare Other | Source: Ambulatory Visit | Attending: Obstetrics & Gynecology | Admitting: Obstetrics & Gynecology

## 2020-02-13 ENCOUNTER — Other Ambulatory Visit: Payer: Self-pay

## 2020-02-13 ENCOUNTER — Ambulatory Visit (INDEPENDENT_AMBULATORY_CARE_PROVIDER_SITE_OTHER): Payer: Medicare Other | Admitting: Obstetrics & Gynecology

## 2020-02-13 ENCOUNTER — Encounter: Payer: Self-pay | Admitting: Obstetrics & Gynecology

## 2020-02-13 VITALS — BP 120/80 | Ht 62.0 in | Wt 166.0 lb

## 2020-02-13 DIAGNOSIS — Z124 Encounter for screening for malignant neoplasm of cervix: Secondary | ICD-10-CM | POA: Insufficient documentation

## 2020-02-13 DIAGNOSIS — Z1231 Encounter for screening mammogram for malignant neoplasm of breast: Secondary | ICD-10-CM | POA: Diagnosis not present

## 2020-02-13 DIAGNOSIS — Z1151 Encounter for screening for human papillomavirus (HPV): Secondary | ICD-10-CM | POA: Diagnosis not present

## 2020-02-13 DIAGNOSIS — Z01419 Encounter for gynecological examination (general) (routine) without abnormal findings: Secondary | ICD-10-CM | POA: Diagnosis not present

## 2020-02-13 MED ORDER — MEDROXYPROGESTERONE ACETATE 150 MG/ML IM SUSP: 150.0000 mg | INTRAMUSCULAR | 3 refills | Status: DC

## 2020-02-13 NOTE — Progress Notes (Signed)
HPI:      Sandra Osborne is a 49 y.o. with No LMP recorded. Patient has had an injection., she presents today for her annual examination. The patient has no complaints today. Caregiver present w pt today, due to CP. The patient is not sexually active. Her last pap: approximate date 3+ years ago and was normal. The patient does not know perform self breast exams.  There is no notable family history of breast or ovarian cancer in her family.  The patient has regular exercise: yes.  The patient denies current symptoms of depression.    GYN History: Contraception: Depo-Provera injections  PMHx: Past Medical History:  Diagnosis Date  . Cerebral palsy (HCC)   . Diabetes (HCC)   . Dyslipidemia   . Dysrhythmia    tachycardia  . History of heart disease   . Hypertension   . Intermittent explosive disorder   . Intractable seizures (HCC)   . Mental retardation   . Obesity    Past Surgical History:  Procedure Laterality Date  . CHOLECYSTECTOMY N/A 08/08/2019   Procedure: LAPAROSCOPIC CHOLECYSTECTOMY;  Surgeon: Leafy Ro, MD;  Location: [facility] ORS;  Service: General;  Laterality: N/A;  . COLONOSCOPY WITH PROPOFOL N/A 01/23/2017   Procedure: COLONOSCOPY WITH PROPOFOL;  Surgeon: Scot Jun, MD;  Location: [facility] ENDOSCOPY;  Service: Endoscopy;  Laterality: N/A;  . dental extractions    . ENDOSCOPIC RETROGRADE CHOLANGIOPANCREATOGRAPHY (ERCP) WITH PROPOFOL N/A 08/05/2019   Procedure: ENDOSCOPIC RETROGRADE CHOLANGIOPANCREATOGRAPHY (ERCP) WITH PROPOFOL;  Surgeon: Midge Minium, MD;  Location: [facility] ENDOSCOPY;  Service: Endoscopy;  Laterality: N/A;   Family History  Problem Relation Age of Onset  . Lung cancer Mother   . Brain cancer Mother   . Breast cancer Neg Hx    Social History   Tobacco Use  . Smoking status: Never Smoker  . Smokeless tobacco: Never Used  Substance Use Topics  . Alcohol use: No  . Drug use: No    Current Outpatient Medications:  .  Calcium  Carbonate-Vitamin D 600-400 MG-UNIT tablet, Take by mouth., Disp: , Rfl:  .  cetirizine (ZYRTEC) 10 MG tablet, Take 10 mg by mouth daily., Disp: , Rfl:  .  docusate sodium (COLACE) 100 MG capsule, Take 100 mg by mouth daily., Disp: , Rfl:  .  fexofenadine (ALLEGRA) 180 MG tablet, Take 180 mg by mouth daily as needed., Disp: , Rfl:  .  fluticasone (FLONASE) 50 MCG/ACT nasal spray, , Disp: , Rfl:  .  KEPPRA 500 MG tablet, TAKE 2 TABLETS BY MOUTH TWICE DAILY, Disp: 360 tablet, Rfl: 3 .  lovastatin (MEVACOR) 20 MG tablet, Take 20 mg by mouth at bedtime., Disp: , Rfl:  .  medroxyPROGESTERone (DEPO-PROVERA) 150 MG/ML injection, Inject 1 mL (150 mg total) into the muscle every 3 (three) months., Disp: 1 mL, Rfl: 3 .  metoprolol (LOPRESSOR) 50 MG tablet, Take 50 mg by mouth daily. , Disp: , Rfl:  .  quinapril (ACCUPRIL) 10 MG tablet, Take 10 mg by mouth daily., Disp: , Rfl:  .  ZONEGRAN 100 MG capsule, Take 3 capsules (300 mg total) by mouth 2 (two) times daily., Disp: 540 capsule, Rfl: 3 Allergies: Depakote [divalproex sodium] and Primidone  Review of Systems  Constitutional: Negative for chills, fever and malaise/fatigue.  HENT: Negative for congestion, sinus pain and sore throat.   Eyes: Negative for blurred vision and pain.  Respiratory: Negative for cough and wheezing.   Cardiovascular: Negative for chest pain and leg  swelling.  Gastrointestinal: Negative for abdominal pain, constipation, diarrhea, heartburn, nausea and vomiting.  Genitourinary: Negative for dysuria, frequency, hematuria and urgency.  Musculoskeletal: Negative for back pain, joint pain, myalgias and neck pain.  Skin: Negative for itching and rash.  Neurological: Negative for dizziness, tremors and weakness.  Endo/Heme/Allergies: Does not bruise/bleed easily.  Psychiatric/Behavioral: Negative for depression. The patient is not nervous/anxious and does not have insomnia.     Objective: BP 120/80   Ht 5\' 2"  (1.575 m)   Wt  166 lb (75.3 kg)   BMI 30.36 kg/m   Filed Weights   02/13/20 1524  Weight: 166 lb (75.3 kg)   Body mass index is 30.36 kg/m. Physical Exam Constitutional:      General: She is not in acute distress.    Appearance: She is well-developed.  Genitourinary:     Pelvic exam was performed with patient supine.     Vagina, uterus and rectum normal.     No lesions in the vagina.     No vaginal bleeding.     No cervical motion tenderness, friability, lesion or polyp.     Uterus is mobile.     Uterus is not enlarged.     No uterine mass detected.    Uterus is midaxial.     No right or left adnexal mass present.     Right adnexa not tender.     Left adnexa not tender.     Genitourinary Comments: Limited exam due to pt compliance  HENT:     Head: Normocephalic and atraumatic. No laceration.     Right Ear: Hearing normal.     Left Ear: Hearing normal.     Mouth/Throat:     Pharynx: Uvula midline.  Eyes:     Pupils: Pupils are equal, round, and reactive to light.  Neck:     Thyroid: No thyromegaly.  Cardiovascular:     Rate and Rhythm: Normal rate and regular rhythm.     Heart sounds: No murmur. No friction rub. No gallop.   Pulmonary:     Effort: Pulmonary effort is normal. No respiratory distress.     Breath sounds: Normal breath sounds. No wheezing.  Chest:     Breasts:        Right: No mass, skin change or tenderness.        Left: No mass, skin change or tenderness.  Abdominal:     General: Bowel sounds are normal. There is no distension.     Palpations: Abdomen is soft.     Tenderness: There is no abdominal tenderness. There is no rebound.  Musculoskeletal:        General: Normal range of motion.     Cervical back: Normal range of motion and neck supple.  Neurological:     Mental Status: She is alert and oriented to person, place, and time.     Cranial Nerves: No cranial nerve deficit.  Skin:    General: Skin is warm and dry.  Psychiatric:        Judgment: Judgment  normal.  Vitals reviewed.     Assessment:  ANNUAL EXAM 1. Women's annual routine gynecological examination   2. Encounter for screening mammogram for malignant neoplasm of breast   3. Screening for cervical cancer      Screening Plan:            1.  Cervical Screening-  Pap smear done today  2. Breast screening- Exam annually and mammogram>40 planned  3. Colonoscopy every 10 years, Hemoccult testing - after age 49  4. Labs managed by PCP  5. Counseling for contraception: Depo-Provera  Consider stopping Depo in next year or two Risks of osteoporosis and bone loss w extended Depo use discussed Pt also has problems w anxiety and acting out w periods, which is why she has been on Depo these past years    F/U  Return in about 1 year (around 02/12/2021) for Annual.  Barnett Applebaum, MD, [facility], [facility] 02/13/2020  4:05 PM

## 2020-02-13 NOTE — Patient Instructions (Signed)
PAP every three years Mammogram every year, 3 mos after Covid vaccine    Call [phone] to schedule at [facility] yearly (with PCP)

## 2020-02-15 LAB — CYTOLOGY - PAP
Chlamydia: NEGATIVE
Comment: NEGATIVE
Comment: NEGATIVE
Comment: NORMAL
Diagnosis: NEGATIVE
High risk HPV: NEGATIVE
Neisseria Gonorrhea: NEGATIVE

## 2020-02-23 ENCOUNTER — Ambulatory Visit: Payer: Medicare Other | Admitting: Podiatry

## 2020-03-05 ENCOUNTER — Encounter: Payer: Self-pay | Admitting: Podiatry

## 2020-03-05 ENCOUNTER — Other Ambulatory Visit: Payer: Self-pay

## 2020-03-05 ENCOUNTER — Ambulatory Visit (INDEPENDENT_AMBULATORY_CARE_PROVIDER_SITE_OTHER): Payer: Medicare Other | Admitting: Podiatry

## 2020-03-05 VITALS — Temp 97.3°F

## 2020-03-05 DIAGNOSIS — E1151 Type 2 diabetes mellitus with diabetic peripheral angiopathy without gangrene: Secondary | ICD-10-CM | POA: Diagnosis not present

## 2020-03-05 DIAGNOSIS — B351 Tinea unguium: Secondary | ICD-10-CM | POA: Diagnosis not present

## 2020-03-05 DIAGNOSIS — M79609 Pain in unspecified limb: Secondary | ICD-10-CM | POA: Diagnosis not present

## 2020-03-05 DIAGNOSIS — E1142 Type 2 diabetes mellitus with diabetic polyneuropathy: Secondary | ICD-10-CM | POA: Diagnosis not present

## 2020-03-05 DIAGNOSIS — M2011 Hallux valgus (acquired), right foot: Secondary | ICD-10-CM

## 2020-03-05 NOTE — Progress Notes (Signed)
This patient returns to my office for at risk foot care.  This patient requires this care by a professional since this patient will be at risk due to having type 2 diabetes with cerebral palsy.  This patient is unable to cut nails herself since the patient cannot reach her nails.These nails are painful walking and wearing shoes.  This patient presents for at risk foot care today.  General Appearance  Alert, conversant and in no acute stress.  Vascular  Dorsalis pedis and posterior tibial  pulses are weakly  palpable  bilaterally.  Capillary return is within normal limits  bilaterally. Temperature is within normal limits  bilaterally.  Neurologic  Senn-Weinstein monofilament wire test diminished   bilaterally. Muscle power within normal limits bilaterally.  Nails Thick disfigured discolored nails with subungual debris  from hallux to fifth toes bilaterally. No evidence of bacterial infection or drainage bilaterally.  Orthopedic  No limitations of motion  feet .  No crepitus or effusions noted.  No bony pathology or digital deformities noted.  Skin  normotropic skin with no porokeratosis noted bilaterally.  No signs of infections or ulcers noted.     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  Consent was obtained for treatment procedures.   Mechanical debridement of nails 1-5  bilaterally performed with a nail nipper.  Filed with dremel without incident.    Return office visit     3 months                 Told patient to return for periodic foot care and evaluation due to potential at risk complications.   Helane Gunther DPM

## 2020-05-31 ENCOUNTER — Telehealth: Payer: Self-pay | Admitting: Neurology

## 2020-05-31 NOTE — Telephone Encounter (Signed)
PA submitted through cover my meds/[employer] [id number] Can take 1-3 days before receiving a response

## 2020-05-31 NOTE — Telephone Encounter (Signed)
Received approval for the patient. This approval authorizes your coverage from 03/02/2020 - 05/31/2021

## 2020-06-11 ENCOUNTER — Encounter: Payer: Self-pay | Admitting: Podiatry

## 2020-06-11 ENCOUNTER — Ambulatory Visit (INDEPENDENT_AMBULATORY_CARE_PROVIDER_SITE_OTHER): Payer: Medicare Other | Admitting: Podiatry

## 2020-06-11 ENCOUNTER — Other Ambulatory Visit: Payer: Self-pay

## 2020-06-11 DIAGNOSIS — B351 Tinea unguium: Secondary | ICD-10-CM | POA: Diagnosis not present

## 2020-06-11 DIAGNOSIS — M2011 Hallux valgus (acquired), right foot: Secondary | ICD-10-CM

## 2020-06-11 DIAGNOSIS — M2010 Hallux valgus (acquired), unspecified foot: Secondary | ICD-10-CM

## 2020-06-11 DIAGNOSIS — M79609 Pain in unspecified limb: Secondary | ICD-10-CM

## 2020-06-11 DIAGNOSIS — E1151 Type 2 diabetes mellitus with diabetic peripheral angiopathy without gangrene: Secondary | ICD-10-CM

## 2020-06-11 DIAGNOSIS — M79676 Pain in unspecified toe(s): Secondary | ICD-10-CM | POA: Diagnosis not present

## 2020-06-11 NOTE — Progress Notes (Signed)
This patient returns to my office for at risk foot care.  This patient requires this care by a professional since this patient will be at risk due to having type 2 diabetes with cerebral palsy.  This patient is unable to cut nails herself since the patient cannot reach her nails.These nails are painful walking and wearing shoes.  This patient presents for at risk foot care today.  General Appearance  Alert, conversant and in no acute stress.  Vascular  Dorsalis pedis and posterior tibial  pulses are weakly  palpable  bilaterally.  Capillary return is within normal limits  bilaterally. Temperature is within normal limits  bilaterally.  Neurologic  Senn-Weinstein monofilament wire test diminished   bilaterally. Muscle power within normal limits bilaterally.  Nails Thick disfigured discolored nails with subungual debris  from hallux to fifth toes bilaterally. No evidence of bacterial infection or drainage bilaterally.  Orthopedic  No limitations of motion  feet .  No crepitus or effusions noted.  No bony pathology or digital deformities noted.  Skin  normotropic skin with no porokeratosis noted bilaterally.  No signs of infections or ulcers noted.     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  Consent was obtained for treatment procedures.   Mechanical debridement of nails 1-5  bilaterally performed with a nail nipper.  Filed with dremel without incident.  To order shoes next visit.   Return office visit     3 months                 Told patient to return for periodic foot care and evaluation due to potential at risk complications.   Helane Gunther DPM

## 2020-08-22 ENCOUNTER — Telehealth: Payer: Self-pay | Admitting: Neurology

## 2020-08-22 NOTE — Telephone Encounter (Signed)
Received fax from [employer]. PA for brand name Keppra has been approved. Coverage dates: 05/24/20 to 08/22/21. Will fax a copy of the determination to patient's pharmacy.

## 2020-08-22 NOTE — Telephone Encounter (Signed)
Received a PA request for brand name Keppra 500mg . PA was started via . Key is [id number]. Was advised that the PA will be approved within the next 1-3 business days. Will check back later for a determination.

## 2020-08-29 ENCOUNTER — Ambulatory Visit: Payer: Self-pay | Admitting: Neurology

## 2020-09-10 ENCOUNTER — Ambulatory Visit (INDEPENDENT_AMBULATORY_CARE_PROVIDER_SITE_OTHER): Payer: Medicare Other | Admitting: Podiatry

## 2020-09-10 ENCOUNTER — Encounter: Payer: Self-pay | Admitting: Podiatry

## 2020-09-10 ENCOUNTER — Other Ambulatory Visit: Payer: Self-pay

## 2020-09-10 DIAGNOSIS — B351 Tinea unguium: Secondary | ICD-10-CM

## 2020-09-10 DIAGNOSIS — M2010 Hallux valgus (acquired), unspecified foot: Secondary | ICD-10-CM

## 2020-09-10 DIAGNOSIS — M2011 Hallux valgus (acquired), right foot: Secondary | ICD-10-CM | POA: Diagnosis not present

## 2020-09-10 DIAGNOSIS — E1151 Type 2 diabetes mellitus with diabetic peripheral angiopathy without gangrene: Secondary | ICD-10-CM | POA: Diagnosis not present

## 2020-09-10 DIAGNOSIS — M79609 Pain in unspecified limb: Secondary | ICD-10-CM

## 2020-09-10 DIAGNOSIS — E1142 Type 2 diabetes mellitus with diabetic polyneuropathy: Secondary | ICD-10-CM

## 2020-09-10 NOTE — Progress Notes (Signed)
This patient returns to my office for at risk foot care.  This patient requires this care by a professional since this patient will be at risk due to having type 2 diabetes with cerebral palsy.  This patient is unable to cut nails herself since the patient cannot reach her nails.These nails are painful walking and wearing shoes.  Patient is brought to the office by legal guardian.  This patient presents for at risk foot care today.  General Appearance  Alert, conversant and in no acute stress.  Vascular  Dorsalis pedis and posterior tibial  pulses are weakly  palpable  bilaterally.  Capillary return is within normal limits  bilaterally. Temperature is within normal limits  bilaterally.  Neurologic  Senn-Weinstein monofilament wire test diminished   bilaterally. Muscle power within normal limits bilaterally.  Nails Thick disfigured discolored nails with subungual debris  from hallux to fifth toes bilaterally. No evidence of bacterial infection or drainage bilaterally.  Orthopedic  No limitations of motion  feet .  No crepitus or effusions noted.  No bony pathology or digital deformities noted.  Skin  normotropic skin with no porokeratosis noted bilaterally.  No signs of infections or ulcers noted.     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  Consent was obtained for treatment procedures.   Mechanical debridement of nails 1-5  bilaterally performed with a nail nipper.  Filed with dremel without incident.  To order shoes next visit.   Return office visit     3 months                 Told patient to return for periodic foot care and evaluation due to potential at risk complications.   Helane Gunther DPM

## 2020-10-16 ENCOUNTER — Other Ambulatory Visit: Payer: Self-pay | Admitting: Neurology

## 2020-10-17 ENCOUNTER — Ambulatory Visit: Payer: Medicare Other | Admitting: Orthotics

## 2020-10-17 ENCOUNTER — Other Ambulatory Visit: Payer: Self-pay

## 2020-11-05 ENCOUNTER — Telehealth: Payer: Self-pay | Admitting: Neurology

## 2020-11-05 ENCOUNTER — Ambulatory Visit: Payer: Medicare Other | Admitting: Neurology

## 2020-11-05 NOTE — Telephone Encounter (Signed)
[name](caretaker & sister/Not on DPR) is asking for a call from RN to see if there is anyway pt can be seen today.  It was explained to pt's sister that there was an appointment scheduled for today but there are notes stating: Canceled via automated reminder system (Automated Cancel).  Pt's sister/caretaker is asking RN to call.  [name] is also asking for a call from Megan,NP(since she is the Print production planner over phone staff.) [name] is wanting to voice her dissatisfaction re: why the call was not recorded re: who she spoke with about having the appointment confirmed just last week.  [name] was offered other office visit slots this week and the next avail in May(after 3 pm ) she declined stating can not wait that long, she was give the option of the wait list she still declined.  Please call.

## 2020-11-05 NOTE — Telephone Encounter (Signed)
Error

## 2020-11-05 NOTE — Telephone Encounter (Signed)
Megan to call.

## 2020-11-12 ENCOUNTER — Other Ambulatory Visit: Payer: Self-pay | Admitting: Neurology

## 2020-11-14 ENCOUNTER — Telehealth: Payer: Self-pay | Admitting: Podiatry

## 2020-11-14 NOTE — Telephone Encounter (Signed)
Called number listed and number is for her sister and I notified her that the paperwork we got from Dr Letta Pate stated the last office visit was 11.24.2020 and is not valid. She stated pt was just seen for her shot and to resend it over. I told her I would refax it now.

## 2020-11-30 ENCOUNTER — Other Ambulatory Visit: Payer: Self-pay

## 2020-11-30 ENCOUNTER — Ambulatory Visit
Admission: RE | Admit: 2020-11-30 | Discharge: 2020-11-30 | Disposition: A | Discharge status: Home / Self Care | Payer: Medicare Other | Source: Ambulatory Visit | Attending: Obstetrics & Gynecology | Admitting: Obstetrics & Gynecology

## 2020-11-30 DIAGNOSIS — Z1231 Encounter for screening mammogram for malignant neoplasm of breast: Secondary | ICD-10-CM

## 2020-12-05 ENCOUNTER — Encounter: Payer: Self-pay | Admitting: Obstetrics & Gynecology

## 2020-12-17 ENCOUNTER — Other Ambulatory Visit: Payer: Self-pay

## 2020-12-17 ENCOUNTER — Encounter: Payer: Self-pay | Admitting: Podiatry

## 2020-12-17 ENCOUNTER — Ambulatory Visit (INDEPENDENT_AMBULATORY_CARE_PROVIDER_SITE_OTHER): Payer: Medicare Other | Admitting: Podiatry

## 2020-12-17 DIAGNOSIS — B351 Tinea unguium: Secondary | ICD-10-CM | POA: Diagnosis not present

## 2020-12-17 DIAGNOSIS — M79609 Pain in unspecified limb: Secondary | ICD-10-CM

## 2020-12-17 DIAGNOSIS — M2011 Hallux valgus (acquired), right foot: Secondary | ICD-10-CM

## 2020-12-17 DIAGNOSIS — E1151 Type 2 diabetes mellitus with diabetic peripheral angiopathy without gangrene: Secondary | ICD-10-CM

## 2020-12-17 NOTE — Progress Notes (Signed)
This patient returns to my office for at risk foot care.  This patient requires this care by a professional since this patient will be at risk due to having type 2 diabetes with cerebral palsy.  This patient is unable to cut nails herself since the patient cannot reach her nails.These nails are painful walking and wearing shoes.  Patient is brought to the office by legal guardian.  This patient presents for at risk foot care today.  General Appearance  Alert, conversant and in no acute stress.  Vascular  Dorsalis pedis and posterior tibial  pulses are weakly  palpable  bilaterally.  Capillary return is within normal limits  bilaterally. Temperature is within normal limits  bilaterally.  Neurologic  Senn-Weinstein monofilament wire test diminished   bilaterally. Muscle power within normal limits bilaterally.  Nails Thick disfigured discolored nails with subungual debris  from hallux to fifth toes bilaterally. No evidence of bacterial infection or drainage bilaterally.  Orthopedic  No limitations of motion  feet .  No crepitus or effusions noted.  No bony pathology or digital deformities noted.  Skin  normotropic skin with no porokeratosis noted bilaterally.  No signs of infections or ulcers noted.     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  Consent was obtained for treatment procedures.   Mechanical debridement of nails 1-5  bilaterally performed with a nail nipper.  Filed with dremel without incident.  To check with Dawn about her  shoes next visit.   Return office visit     3 months                 Told patient to return for periodic foot care and evaluation due to potential at risk complications.   Helane Gunther DPM

## 2020-12-28 ENCOUNTER — Telehealth: Payer: Self-pay | Admitting: Podiatry

## 2020-12-28 NOTE — Telephone Encounter (Signed)
Called pts caregiver per Dr Stacie Acres Velna Hatchet) we did receive the documents for the diabetic shoes put they are not valid because pt has not have an appt with Dr Letta Pate in over a yr. Velna Hatchet said pt has an appt on Tuesday of next week and I have faxed the documents back with the appt date written on them.

## 2021-01-17 ENCOUNTER — Other Ambulatory Visit: Payer: Self-pay | Admitting: Neurology

## 2021-01-18 ENCOUNTER — Telehealth: Payer: Self-pay | Admitting: Neurology

## 2021-01-18 NOTE — Telephone Encounter (Signed)
[facility] is asking for a call from [facility] Call [facility] re: this pt being a [facility] pt, they are asking to speak with On Call Dr about pt's Keppra not being filled

## 2021-01-19 ENCOUNTER — Telehealth: Payer: Self-pay | Admitting: Neurology

## 2021-01-19 MED ORDER — KEPPRA 500 MG PO TABS: 1000.0000 mg | ORAL_TABLET | Freq: Two times a day (BID) | ORAL | 0 refills | Status: DC

## 2021-01-19 NOTE — Telephone Encounter (Signed)
Sandra Osborne with [facility] requested refill on Keppra prescription.  I approved the pended prescription.  Patient has an upcoming appointment on 02/06/2021.

## 2021-01-21 NOTE — Telephone Encounter (Signed)
Noted  

## 2021-02-06 ENCOUNTER — Ambulatory Visit (INDEPENDENT_AMBULATORY_CARE_PROVIDER_SITE_OTHER): Payer: Medicare Other | Admitting: Neurology

## 2021-02-06 ENCOUNTER — Encounter: Payer: Self-pay | Admitting: Neurology

## 2021-02-06 VITALS — BP 98/72 | HR 52 | Ht 62.0 in | Wt 155.0 lb

## 2021-02-06 DIAGNOSIS — G40309 Generalized idiopathic epilepsy and epileptic syndromes, not intractable, without status epilepticus: Secondary | ICD-10-CM | POA: Diagnosis not present

## 2021-02-06 DIAGNOSIS — F79 Unspecified intellectual disabilities: Secondary | ICD-10-CM | POA: Diagnosis not present

## 2021-02-06 MED ORDER — KEPPRA 500 MG PO TABS: 1000.0000 mg | ORAL_TABLET | Freq: Two times a day (BID) | ORAL | 3 refills | Status: DC

## 2021-02-06 MED ORDER — ZONEGRAN 100 MG PO CAPS: 300.0000 mg | ORAL_CAPSULE | Freq: Two times a day (BID) | ORAL | 3 refills | Status: DC

## 2021-02-06 NOTE — Progress Notes (Signed)
I have read the note, and I agree with the clinical assessment and plan.  Gricel Copen K Enas Winchel   

## 2021-02-06 NOTE — Progress Notes (Signed)
PATIENT: Sandra Osborne DOB: 12-23-1970  REASON FOR VISIT: follow up HISTORY FROM: patient  HISTORY OF PRESENT ILLNESS: Today 02/06/21  Ms. Brougham is a 50 year old female with history of cerebral palsy with right hemiparesis and mental retardation with intractable seizures.  Her seizures are nocturnal.  On Keppra and Zonegran.  She is verbal when she wants to be. Her seizures are described as single jerks nocturnally, very brief, doesn't wake her up.  She is normal the next morning.  None during the day.  No longer seeing her psychiatrist, she left the practice.  Eating good.  No falls.  Has not been seen recently by her primary care doctor.  She has no complaints.  Remains overall stable.  Here today accompanied by her guardian, [name].  Update 08/30/2019 SS: Ms. Makris is a 50 year old female with history of cerebral palsy with a right hemiparesis and mental retardation and intractable seizures.  Her seizures are nocturnal in nature.  She may have 1 or 2 seizures a month.  She remains on relatively high doses of Keppra and Zonegran.  She was recently hospitalized for gallstones and subsequent cholecystectomy.  She is nonverbal.  Her guardian, [name], indicates that over the last month she has had a few more seizure spells maybe 3 this month at night, related to pain and discomfort from her gallbladder.  The spells are described as jerks, always nocturnally, very brief, not violent, and she never wakes up.  She is tolerating Keppra and Zonegran well.  She requires assistance with her daily activities.  She is no longer taking Seroquel, as her psychiatrist left the practice.  She is planning to establish with a new provider.  She presents today for follow-up accompanied by [name], who is her guardian and sister.  HISTORY 08/24/2018 Dr. Anne Hahn: Ms. Strough is a 50 year old black female with a history of cerebral palsy with a right hemiparesis and mental retardation and intractable seizures.  Her  seizures generally tend to be nocturnal in nature, she will have 1 or 2 seizures a month.  She remains on relatively high doses of Keppra and Zonegran.  The patient apparently is tolerating the medication fairly well.  The patient is eating well, she has not had any falls.  She has not hurt herself with any of the seizures.  The patient lives with her family who is her caretaker, she does require some assistance with bathing and dressing.  The patient comes to this office for an evaluation.   REVIEW OF SYSTEMS: Out of a complete 14 system review of symptoms, the patient complains only of the following symptoms, and all other reviewed systems are negative.  Seizures  ALLERGIES: Allergies  Allergen Reactions  . Depakote [Divalproex Sodium] Other (See Comments)  . Primidone Other (See Comments)    HOME MEDICATIONS: Outpatient Medications Prior to Visit  Medication Sig Dispense Refill  . Calcium Carbonate-Vitamin D 600-400 MG-UNIT tablet Take by mouth.    . cetirizine (ZYRTEC) 10 MG tablet Take 10 mg by mouth daily.    Marland Kitchen docusate sodium (COLACE) 100 MG capsule Take 100 mg by mouth daily.    . fexofenadine (ALLEGRA) 180 MG tablet Take 180 mg by mouth daily as needed.    . fluticasone (FLONASE) 50 MCG/ACT nasal spray     . lovastatin (MEVACOR) 20 MG tablet Take 20 mg by mouth at bedtime.    . medroxyPROGESTERone (DEPO-PROVERA) 150 MG/ML injection Inject 1 mL (150 mg total) into the muscle every 3 (three)  months. 1 mL 3  . metoprolol (LOPRESSOR) 50 MG tablet Take 50 mg by mouth daily.     . quinapril (ACCUPRIL) 10 MG tablet Take 10 mg by mouth daily.    Marland Kitchen KEPPRA 500 MG tablet Take 2 tablets (1,000 mg total) by mouth 2 (two) times daily. 360 tablet 0  . ZONEGRAN 100 MG capsule TAKE 3 CAPSULES BY MOUTH TWICE DAILY 540 capsule 3   No facility-administered medications prior to visit.    PAST MEDICAL HISTORY: Past Medical History:  Diagnosis Date  . Cerebral palsy (HCC)   . Diabetes (HCC)   .  Dyslipidemia   . Dysrhythmia    tachycardia  . History of heart disease   . Hypertension   . Intermittent explosive disorder   . Intractable seizures (HCC)   . Mental retardation   . Obesity     PAST SURGICAL HISTORY: Past Surgical History:  Procedure Laterality Date  . CHOLECYSTECTOMY N/A 08/08/2019   Procedure: LAPAROSCOPIC CHOLECYSTECTOMY;  Surgeon: Leafy Ro, MD;  Location: [facility] ORS;  Service: General;  Laterality: N/A;  . COLONOSCOPY WITH PROPOFOL N/A 01/23/2017   Procedure: COLONOSCOPY WITH PROPOFOL;  Surgeon: Scot Jun, MD;  Location: [facility] ENDOSCOPY;  Service: Endoscopy;  Laterality: N/A;  . dental extractions    . ENDOSCOPIC RETROGRADE CHOLANGIOPANCREATOGRAPHY (ERCP) WITH PROPOFOL N/A 08/05/2019   Procedure: ENDOSCOPIC RETROGRADE CHOLANGIOPANCREATOGRAPHY (ERCP) WITH PROPOFOL;  Surgeon: Midge Minium, MD;  Location: [facility] ENDOSCOPY;  Service: Endoscopy;  Laterality: N/A;    FAMILY HISTORY: Family History  Problem Relation Age of Onset  . Lung cancer Mother   . Brain cancer Mother   . Breast cancer Neg Hx     SOCIAL HISTORY: Social History   Socioeconomic History  . Marital status: Single    Spouse name: Not on file  . Number of children: Not on file  . Years of education: Not on file  . Highest education level: Not on file  Occupational History  . Not on file  Tobacco Use  . Smoking status: Never Smoker  . Smokeless tobacco: Never Used  Vaping Use  . Vaping Use: Unknown  Substance and Sexual Activity  . Alcohol use: No  . Drug use: No  . Sexual activity: Not on file  Other Topics Concern  . Not on file  Social History Narrative   The patient is single and lives with her sister.   The patient goes to a day program.   Patient is left-handed.   Patient drinks two caffeine drinks daily.            Social Determinants of Health   Financial Resource Strain: Not on file  Food Insecurity: Not on file  Transportation Needs: Not on file  Physical  Activity: Not on file  Stress: Not on file  Social Connections: Not on file  Intimate Partner Violence: Not on file   PHYSICAL EXAM  Vitals:   02/06/21 1540  BP: 98/72  Pulse: (!) 52  Weight: 155 lb (70.3 kg)  Height: 5\' 2"  (1.575 m)   Body mass index is 28.35 kg/m.  Generalized: Well developed, in no acute distress   Neurological examination  Mentation: Alert, limited verbal response, does not follow exam commands well Cranial nerve II-XII: Pupils were equal round reactive to light.  Motor: Moving all extremities well Sensory: Sensory testing is intact to soft touch on all 4 extremities. No evidence of extinction is noted.  Coordination: Able to perform finger-nose-finger with the left hand, otherwise  difficulty following commands Gait and station: Wide-based gait, hold onto Metcalf, but can walk independently is staggery Reflexes: Deep tendon reflexes are symmetric  DIAGNOSTIC DATA (LABS, IMAGING, TESTING) - I reviewed patient records, labs, notes, testing and imaging myself where available.  Lab Results  Component Value Date   WBC 12.4 (H) 08/05/2019   HGB 12.5 08/05/2019   HCT 41.3 08/05/2019   MCV 97.2 08/05/2019   PLT 163 08/05/2019      Component Value Date/Time   NA 139 08/10/2019 0336   K 3.7 08/10/2019 0336   CL 111 08/10/2019 0336   CO2 21 (L) 08/10/2019 0336   GLUCOSE 94 08/10/2019 0336   BUN 8 08/10/2019 0336   CREATININE 0.76 08/10/2019 0336   CALCIUM 7.8 (L) 08/10/2019 0336   PROT 5.6 (L) 08/09/2019 0503   ALBUMIN 2.2 (L) 08/09/2019 0503   AST 93 (H) 08/09/2019 0503   ALT 103 (H) 08/09/2019 0503   ALKPHOS 195 (H) 08/09/2019 0503   BILITOT 1.2 08/09/2019 0503   GFRNONAA >60 08/10/2019 0336   GFRAA >60 08/10/2019 0336   No results found for: CHOL, HDL, LDLCALC, LDLDIRECT, TRIG, CHOLHDL No results found for: BTDV7O No results found for: VITAMINB12 No results found for: TSH  ASSESSMENT AND PLAN 50 y.o. year old female  has a past medical  history of Cerebral palsy (HCC), Diabetes (HCC), Dyslipidemia, Dysrhythmia, History of heart disease, Hypertension, Intermittent explosive disorder, Intractable seizures (HCC), Mental retardation, and Obesity. here with:  1.  Intractable seizures 2.  Mental retardation  -Continues to have a 1-3 nocturnal seizure episodes a month, brief  -Continue current medications, requires brand-name Zonegran and Keppra -Will check routine labs, including seizure drug levels -Her seizures have remained intractable -Follow-up in 1 year or sooner if needed  Otila Kluver, DNP 02/06/2021, 4:00 PM [facility] [address], [address] [place], [place] [address] [phone]

## 2021-02-06 NOTE — Patient Instructions (Signed)
Continue current medications Check labs today  Refills sent in  See you back in 1 year

## 2021-02-19 LAB — LEVETIRACETAM LEVEL

## 2021-02-19 LAB — COMPREHENSIVE METABOLIC PANEL
ALT: 11 IU/L (ref 0–32)
AST: 17 IU/L (ref 0–40)
Albumin/Globulin Ratio: 1.6 (ref 1.2–2.2)
Albumin: 4.2 g/dL (ref 3.8–4.8)
Alkaline Phosphatase: 113 IU/L (ref 44–121)
BUN/Creatinine Ratio: 12 (ref 9–23)
BUN: 10 mg/dL (ref 6–24)
Bilirubin Total: 0.2 mg/dL (ref 0.0–1.2)
CO2: 15 mmol/L — ABNORMAL LOW (ref 20–29)
Calcium: 9.2 mg/dL (ref 8.7–10.2)
Chloride: 110 mmol/L — ABNORMAL HIGH (ref 96–106)
Creatinine, Ser: 0.85 mg/dL (ref 0.57–1.00)
Globulin, Total: 2.7 g/dL (ref 1.5–4.5)
Glucose: 87 mg/dL (ref 65–99)
Potassium: 4.5 mmol/L (ref 3.5–5.2)
Sodium: 144 mmol/L (ref 134–144)
Total Protein: 6.9 g/dL (ref 6.0–8.5)
eGFR: 83 mL/min/{1.73_m2} (ref >59)

## 2021-02-19 LAB — CBC WITH DIFFERENTIAL/PLATELET
Basophils Absolute: 0 10*3/uL (ref 0.0–0.2)
Basos: 1 %
EOS (ABSOLUTE): 0 10*3/uL (ref 0.0–0.4)
Eos: 1 %
Hematocrit: 44 % (ref 34.0–46.6)
Hemoglobin: 14.4 g/dL (ref 11.1–15.9)
Immature Grans (Abs): 0 10*3/uL (ref 0.0–0.1)
Immature Granulocytes: 0 %
Lymphocytes Absolute: 1.4 10*3/uL (ref 0.7–3.1)
Lymphs: 31 %
MCH: 29.4 pg (ref 26.6–33.0)
MCHC: 32.7 g/dL (ref 31.5–35.7)
MCV: 90 fL (ref 79–97)
Monocytes Absolute: 0.5 10*3/uL (ref 0.1–0.9)
Monocytes: 10 %
Neutrophils Absolute: 2.5 10*3/uL (ref 1.4–7.0)
Neutrophils: 57 %
Platelets: 205 10*3/uL (ref 150–450)
RBC: 4.9 x10E6/uL (ref 3.77–5.28)
RDW: 12.6 % (ref 11.7–15.4)
WBC: 4.4 10*3/uL (ref 3.4–10.8)

## 2021-02-19 LAB — ZONISAMIDE LEVEL: Zonisamide: 35.8 ug/mL (ref 10.0–40.0)

## 2021-02-20 ENCOUNTER — Telehealth: Payer: Self-pay

## 2021-02-20 NOTE — Telephone Encounter (Signed)
-----   Message from Glean Salvo, NP sent at 02/19/2021  1:27 PM EDT ----- Please call the patient, apologize for the delay in labs, Keppra level was delayed, was actually canceled by interface, Zonegran level was therapeutic.  Rest of labs showed no significant abnormality. Since, overall stable, doing well, we do not have to repeat Keppra level, if her seizure frequency increases or has other problem her caregiver can let me know.

## 2021-02-20 NOTE — Telephone Encounter (Signed)
Pt verified by name and DOB,  normal results given to sister her cargiver per provider, sister voiced understanding all question answered.

## 2021-03-11 ENCOUNTER — Telehealth: Payer: Self-pay

## 2021-03-11 NOTE — Telephone Encounter (Signed)
Pts sister called to inquire about paperwork for diabetic shoes. Please advise.

## 2021-03-12 NOTE — Telephone Encounter (Signed)
Called pts sister back and told her we have not received the paperwork from pcp and I had faxed it in April right before pts appt. She was calling the pcp office.

## 2021-03-25 ENCOUNTER — Ambulatory Visit: Payer: Medicare Other | Admitting: Podiatry

## 2021-04-02 ENCOUNTER — Ambulatory Visit (INDEPENDENT_AMBULATORY_CARE_PROVIDER_SITE_OTHER): Payer: Medicare Other | Admitting: Podiatry

## 2021-04-02 ENCOUNTER — Other Ambulatory Visit: Payer: Self-pay

## 2021-04-02 DIAGNOSIS — B351 Tinea unguium: Secondary | ICD-10-CM | POA: Diagnosis not present

## 2021-04-02 DIAGNOSIS — E1151 Type 2 diabetes mellitus with diabetic peripheral angiopathy without gangrene: Secondary | ICD-10-CM | POA: Diagnosis not present

## 2021-04-02 DIAGNOSIS — M79609 Pain in unspecified limb: Secondary | ICD-10-CM

## 2021-04-03 ENCOUNTER — Encounter: Payer: Self-pay | Admitting: Podiatry

## 2021-04-03 NOTE — Progress Notes (Signed)
This patient returns to my office for at risk foot care.  This patient requires this care by a professional since this patient will be at risk due to having type 2 diabetes with cerebral palsy.  This patient is unable to cut nails herself since the patient cannot reach her nails.These nails are painful walking and wearing shoes.  Patient is brought to the office by legal guardian.  This patient presents for at risk foot care today.  General Appearance  Alert, conversant and in no acute stress.  Vascular  Dorsalis pedis and posterior tibial  pulses are weakly  palpable  bilaterally.  Capillary return is within normal limits  bilaterally. Temperature is within normal limits  bilaterally.  Neurologic  Senn-Weinstein monofilament wire test diminished   bilaterally. Muscle power within normal limits bilaterally.  Nails Thick disfigured discolored nails with subungual debris  from hallux to fifth toes bilaterally. No evidence of bacterial infection or drainage bilaterally.  Orthopedic  No limitations of motion  feet .  No crepitus or effusions noted.  No bony pathology or digital deformities noted.  Skin  normotropic skin with no porokeratosis noted bilaterally.  No signs of infections or ulcers noted.     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  Consent was obtained for treatment procedures.   Mechanical debridement of nails 1-5  bilaterally performed with a nail nipper.  Filed with dremel without incident.  To check with Dawn about her  shoes next visit.   Return office visit     3 months                 Told patient to return for periodic foot care and evaluation due to potential at risk complications.   Nicholes Rough D.P.M.

## 2021-05-16 ENCOUNTER — Telehealth: Payer: Self-pay | Admitting: Podiatry

## 2021-05-16 ENCOUNTER — Encounter: Payer: Self-pay | Admitting: Podiatry

## 2021-05-16 NOTE — Telephone Encounter (Signed)
Called patient lvm to reschedule 9/26 appt also sent reschedule letter

## 2021-06-04 ENCOUNTER — Telehealth: Payer: Self-pay | Admitting: *Deleted

## 2021-06-04 NOTE — Telephone Encounter (Signed)
PA for Zonegran started on covermymeds (key: [id number]). Pt has pharmacy coverage through [employer] [phone]). EX#[id number] approved through 06/04/2022.

## 2021-06-24 ENCOUNTER — Ambulatory Visit: Payer: Medicare Other | Admitting: Podiatry

## 2021-07-13 IMAGING — MG MM DIGITAL SCREENING BILAT W/ TOMO AND CAD
8 series · 8 of 24 positions shown · non-contrast
Comparison: Previous exam(s).

CLINICAL DATA: Screening.

EXAM:
DIGITAL SCREENING BILATERAL MAMMOGRAM WITH TOMOSYNTHESIS AND CAD
TECHNIQUE: Bilateral screening digital craniocaudal and mediolateral oblique
mammograms were obtained. Bilateral screening digital breast
tomosynthesis was performed. The images were evaluated with
computer-aided detection.

[L MLO synth-2D]
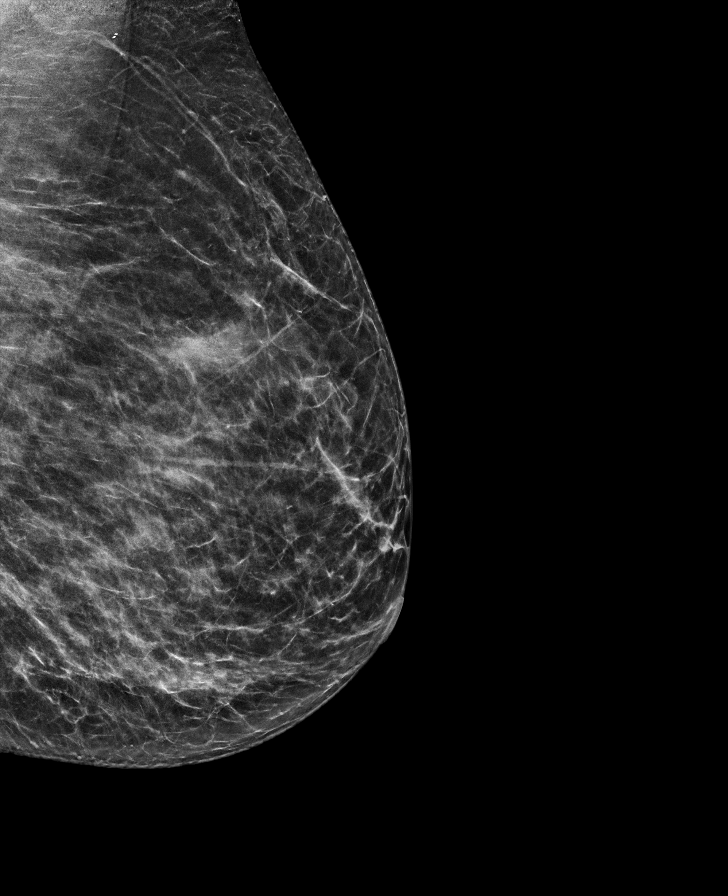

[R MLO synth-2D]
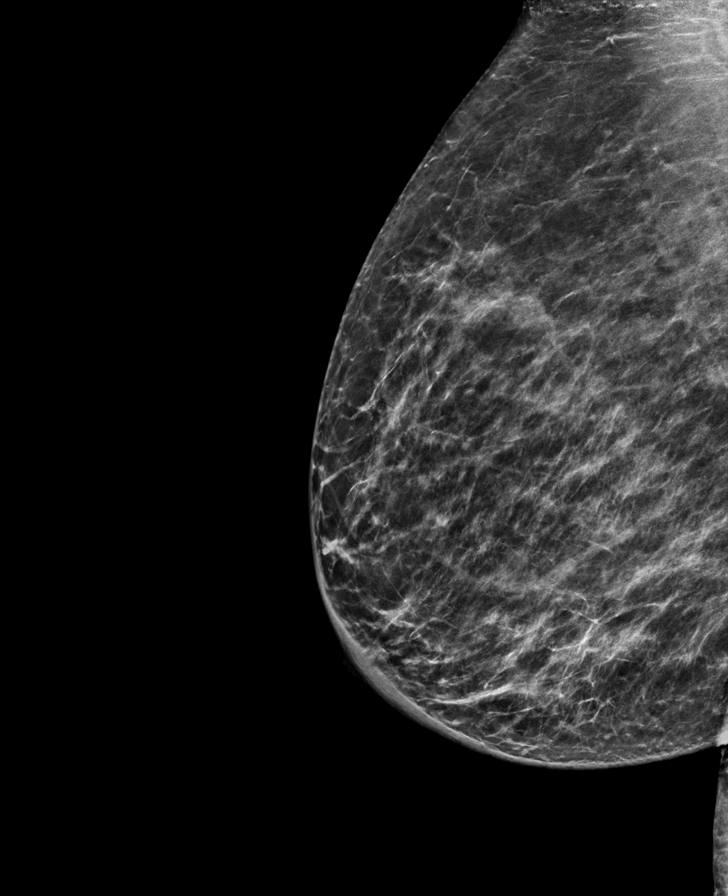

[R CC synth-2D]
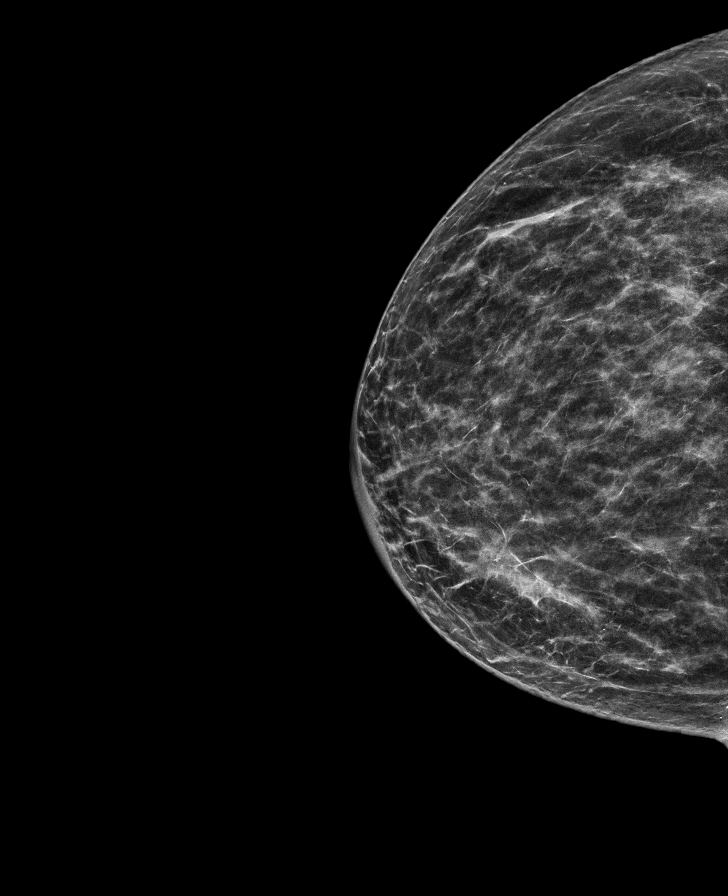

[L CC synth-2D]
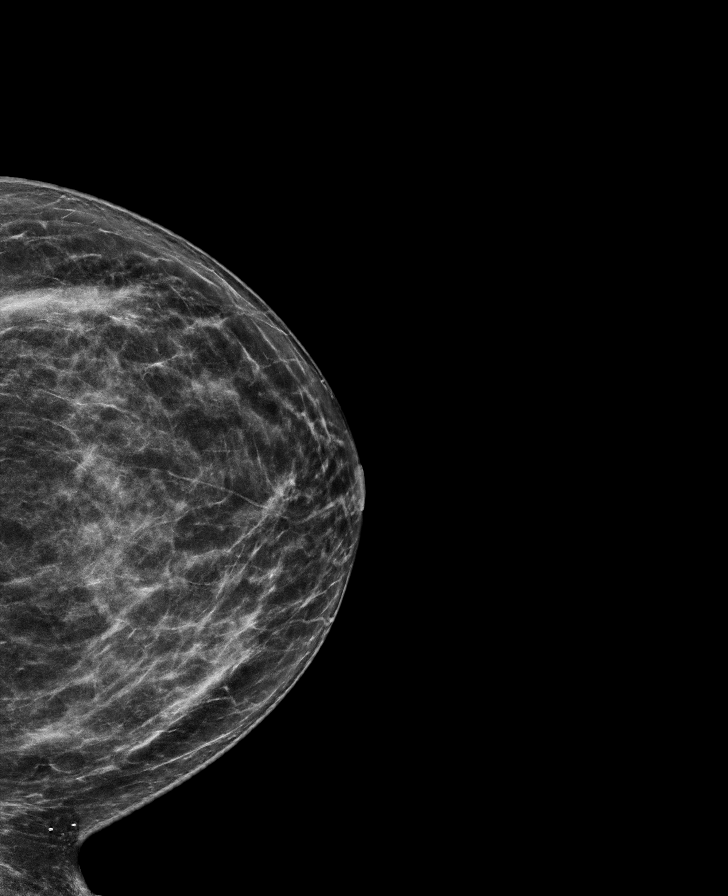

[R MLO tomo · tomo slice 35/69.0]
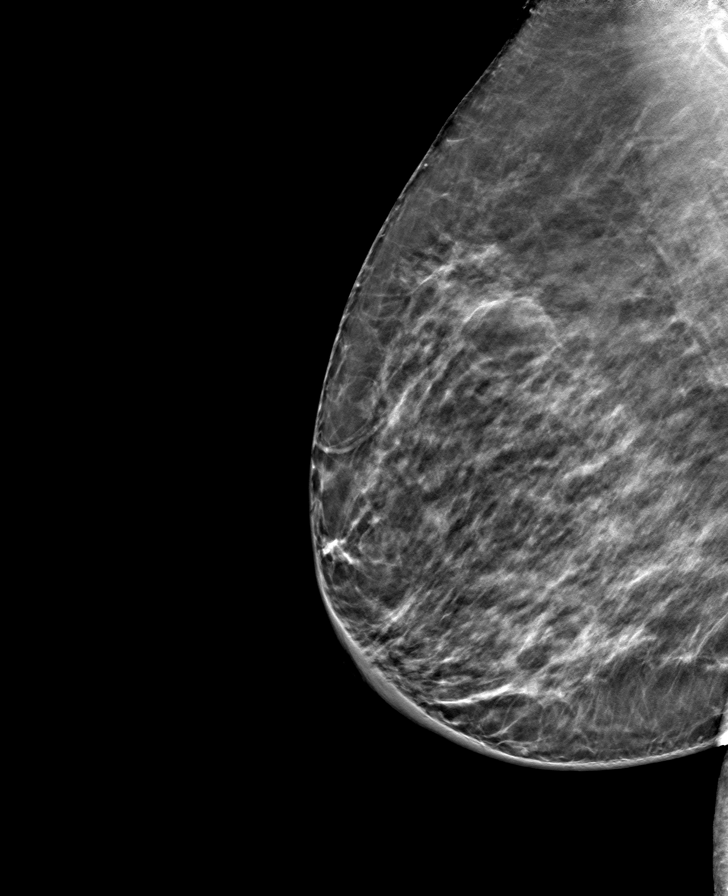

[L MLO tomo · tomo slice 31/61.0]
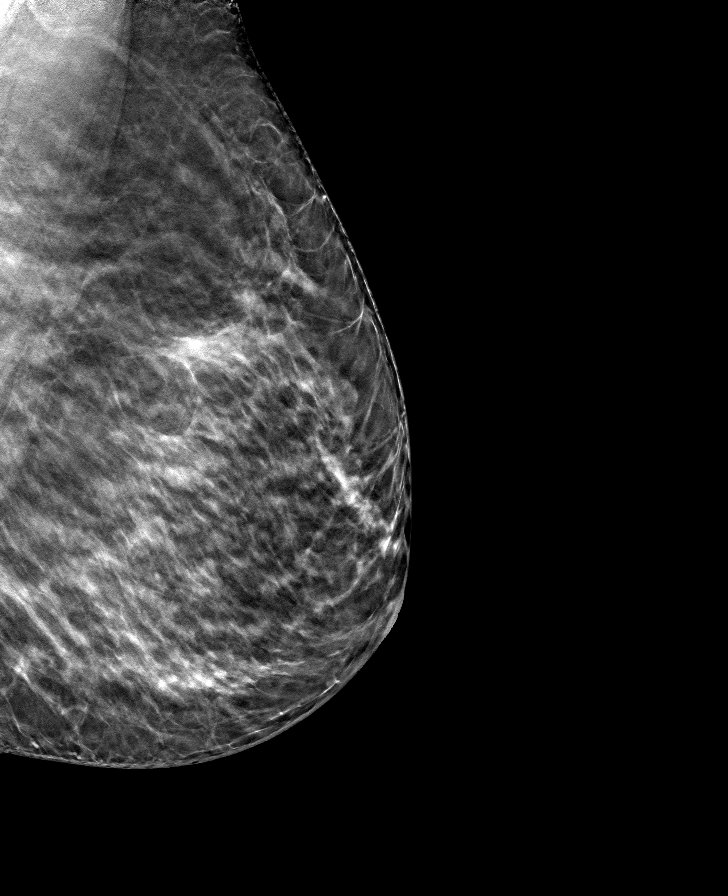

[L CC tomo · tomo slice 35/70.0]
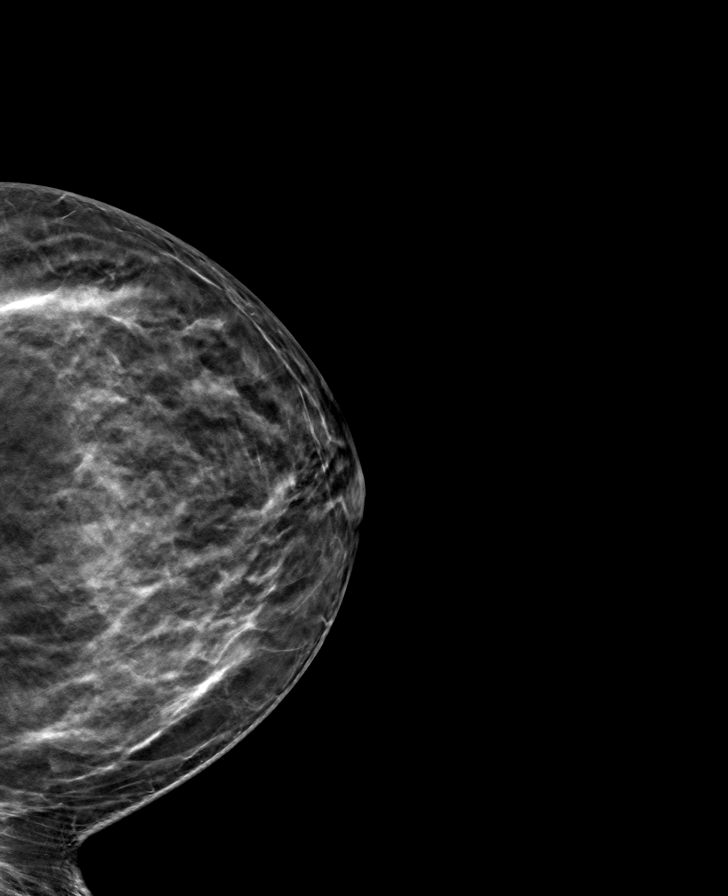

[R CC tomo · tomo slice 33/64.0]
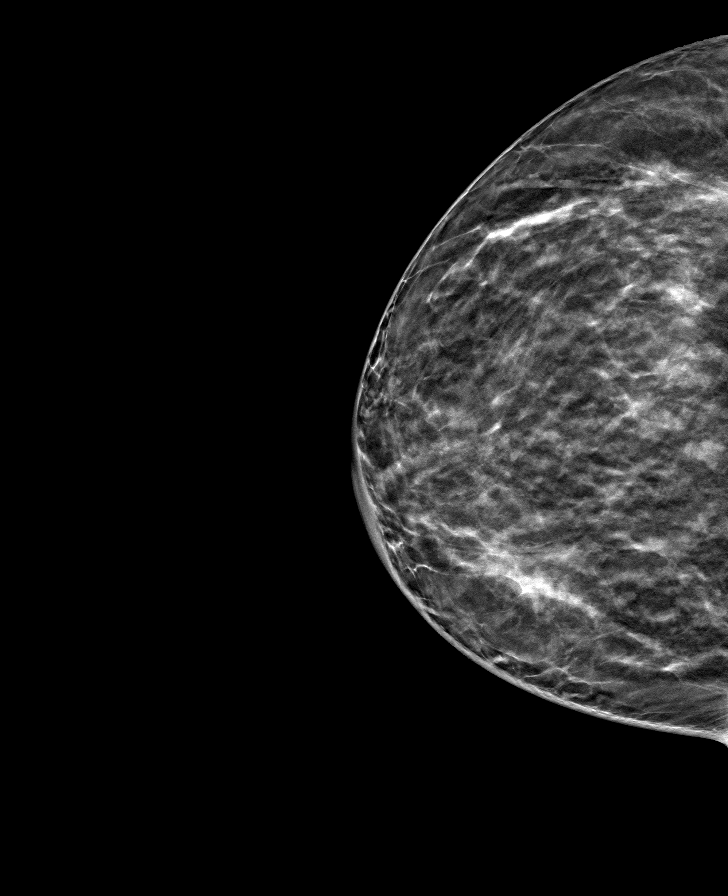

[8 of 24 positions shown; findings below may reference images not displayed]

ACR Breast Density Category c: The breast tissue is heterogeneously
dense, which may obscure small masses.
FINDINGS: There are no findings suspicious for malignancy. The images were
evaluated with computer-aided detection.
IMPRESSION: No mammographic evidence of malignancy. A result letter of this
screening mammogram will be mailed directly to the patient.

RECOMMENDATION:
Screening mammogram in one year. (Code:T4-5-GWO)

BI-RADS CATEGORY  1: Negative.

## 2021-08-21 ENCOUNTER — Encounter (INDEPENDENT_AMBULATORY_CARE_PROVIDER_SITE_OTHER): Payer: Self-pay

## 2021-08-21 ENCOUNTER — Ambulatory Visit (INDEPENDENT_AMBULATORY_CARE_PROVIDER_SITE_OTHER): Payer: Medicare Other

## 2021-08-21 ENCOUNTER — Ambulatory Visit (INDEPENDENT_AMBULATORY_CARE_PROVIDER_SITE_OTHER): Payer: Medicare Other | Admitting: Podiatry

## 2021-08-21 ENCOUNTER — Other Ambulatory Visit: Payer: Self-pay

## 2021-08-21 ENCOUNTER — Telehealth: Payer: Self-pay

## 2021-08-21 DIAGNOSIS — S99922A Unspecified injury of left foot, initial encounter: Secondary | ICD-10-CM

## 2021-08-21 DIAGNOSIS — S91209A Unspecified open wound of unspecified toe(s) with damage to nail, initial encounter: Secondary | ICD-10-CM

## 2021-08-21 NOTE — Telephone Encounter (Signed)
I submitted a PA for brand name Keppra on CMM, Key: [id number] - PA Case ID: [id number].   Awaiting determination from [employer].

## 2021-08-21 NOTE — Telephone Encounter (Signed)
Brand name Keppra tablet approved through 08/21/2022.

## 2021-08-21 NOTE — Patient Instructions (Signed)

## 2021-08-21 NOTE — Progress Notes (Signed)
  Subjective:  Patient ID: Sandra Osborne, female    DOB: 1971-06-28,  MRN: [id number]  Chief Complaint  Patient presents with   Toe Injury     Left baby toe hurt by hitting on bed. Pt diabetic. Dr Stacie Acres pt    50 y.o. female presents with the above complaint. History confirmed with patient.  She is a history of several palsy and intellectual disability.  She has a caregiver here with her that is her daughter.  They think she at the fifth toe on the left foot on the bedpost and nearly pulled the nail off.  Very painful.  She is diabetic well controlled.  Think it may have happened in the last few days.  Objective:  Physical Exam: warm, good capillary refill, normal DP and PT pulses, normal sensory exam, and fifth nail is loose with hematoma under nail plate.  No gross deformity or dislocation  Radiographs: Multiple views x-ray of the left foot: No fracture dislocation noted of fifth toe Assessment:   1. Injury of toe on left foot, initial encounter   2. Traumatic avulsion of nail plate of toe, initial encounter      Plan:  Patient was evaluated and treated and all questions answered.  I reviewed the clinical exam findings and radiographic findings with the patient and her family member.  I discussed with them that the nail is very loose and likely will be lost at some point and I would recommend we prophylactically avulsed the nail plate.  Following verbal consent and a local field block with 1.5 cc each of 0.25% Marcaine plain and 1% lidocaine plain and Betadine prep I removed the overlying nail plate which was very loose.  No fracture or laceration was noted below this.  Sterile dressing was applied with Silvadene and post care instructions were given.  Return as needed for this or other issues  Return if symptoms worsen or fail to improve.

## 2021-08-29 ENCOUNTER — Encounter: Payer: Self-pay | Admitting: Podiatry

## 2021-08-29 ENCOUNTER — Other Ambulatory Visit: Payer: Self-pay

## 2021-08-29 ENCOUNTER — Ambulatory Visit (INDEPENDENT_AMBULATORY_CARE_PROVIDER_SITE_OTHER): Payer: Medicare Other | Admitting: Podiatry

## 2021-08-29 DIAGNOSIS — E1142 Type 2 diabetes mellitus with diabetic polyneuropathy: Secondary | ICD-10-CM

## 2021-08-29 DIAGNOSIS — B351 Tinea unguium: Secondary | ICD-10-CM

## 2021-08-29 DIAGNOSIS — M79609 Pain in unspecified limb: Secondary | ICD-10-CM

## 2021-08-29 DIAGNOSIS — E1151 Type 2 diabetes mellitus with diabetic peripheral angiopathy without gangrene: Secondary | ICD-10-CM

## 2021-08-29 NOTE — Progress Notes (Signed)
This patient returns to my office for at risk foot care.  This patient requires this care by a professional since this patient will be at risk due to having type 2 diabetes with cerebral palsy.  This patient is unable to cut nails herself since the patient cannot reach her nails.These nails are painful walking and wearing shoes.  Patient is brought to the office by legal guardian.  This patient presents for at risk foot care today.  General Appearance  Alert, conversant and in no acute stress.  Vascular  Dorsalis pedis and posterior tibial  pulses are weakly  palpable  bilaterally.  Capillary return is within normal limits  bilaterally. Temperature is within normal limits  bilaterally.  Neurologic  Senn-Weinstein monofilament wire test diminished   bilaterally. Muscle power within normal limits bilaterally.  Nails Thick disfigured discolored nails with subungual debris  from hallux to fifth toes bilaterally. No evidence of bacterial infection or drainage bilaterally.  Orthopedic  No limitations of motion  feet .  No crepitus or effusions noted.  No bony pathology or digital deformities noted.  Skin  normotropic skin with no porokeratosis noted bilaterally.  No signs of infections or ulcers noted.     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  Consent was obtained for treatment procedures.   Mechanical debridement of nails 1-5  bilaterally performed with a nail nipper.  Filed with dremel without incident.  To check with Dawn about her  shoes next visit.   Return office visit     4  months                 Told patient to return for periodic foot care and evaluation due to potential at risk complications.   Helane Gunther DPM

## 2021-10-15 ENCOUNTER — Telehealth: Payer: Self-pay

## 2021-10-15 NOTE — Telephone Encounter (Signed)
Received PA request for zonegran on CMM. Attempted PA but received this message from [employer]: "The patient currently has access to the requested medication and a Prior Authorization is not needed for the patient/medication."  Key: [id number]

## 2021-11-28 ENCOUNTER — Other Ambulatory Visit: Payer: Self-pay

## 2021-11-28 ENCOUNTER — Encounter: Payer: Self-pay | Admitting: Podiatry

## 2021-11-28 ENCOUNTER — Ambulatory Visit (INDEPENDENT_AMBULATORY_CARE_PROVIDER_SITE_OTHER): Payer: Medicare Other | Admitting: Podiatry

## 2021-11-28 DIAGNOSIS — M79676 Pain in unspecified toe(s): Secondary | ICD-10-CM

## 2021-11-28 DIAGNOSIS — E1151 Type 2 diabetes mellitus with diabetic peripheral angiopathy without gangrene: Secondary | ICD-10-CM

## 2021-11-28 DIAGNOSIS — M79609 Pain in unspecified limb: Secondary | ICD-10-CM

## 2021-11-28 DIAGNOSIS — B351 Tinea unguium: Secondary | ICD-10-CM

## 2021-11-28 DIAGNOSIS — M2011 Hallux valgus (acquired), right foot: Secondary | ICD-10-CM

## 2021-11-28 DIAGNOSIS — M2010 Hallux valgus (acquired), unspecified foot: Secondary | ICD-10-CM

## 2021-11-28 DIAGNOSIS — E1142 Type 2 diabetes mellitus with diabetic polyneuropathy: Secondary | ICD-10-CM

## 2021-11-28 NOTE — Progress Notes (Signed)
This patient returns to my office for at risk foot care.  This patient requires this care by a professional since this patient will be at risk due to having type 2 diabetes with cerebral palsy.  This patient is unable to cut nails herself since the patient cannot reach her nails.These nails are painful walking and wearing shoes.  Patient is brought to the office by legal guardian.  This patient presents for at risk foot care today.  General Appearance  Alert, conversant and in no acute stress.  Vascular  Dorsalis pedis and posterior tibial  pulses are weakly  palpable  bilaterally.  Capillary return is within normal limits  bilaterally. Temperature is within normal limits  bilaterally.  Neurologic  Senn-Weinstein monofilament wire test diminished   bilaterally. Muscle power within normal limits bilaterally.  Nails Thick disfigured discolored nails with subungual debris  from hallux to fifth toes bilaterally. No evidence of bacterial infection or drainage bilaterally.  Orthopedic  No limitations of motion  feet .  No crepitus or effusions noted.  No bony pathology or digital deformities noted.  Skin  normotropic skin with no porokeratosis noted bilaterally.  No signs of infections or ulcers noted.     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  Consent was obtained for treatment procedures.   Mechanical debridement of nails 1-5  bilaterally performed with a nail nipper.  Filed with dremel without incident.  To check with Dawn about her  shoes next visit.   Return office visit     4  months                 Told patient to return for periodic foot care and evaluation due to potential at risk complications.   Haylo Fake DPM  

## 2022-02-05 NOTE — Progress Notes (Signed)
? ? ?PATIENT: Sandra Osborne ?DOB: Oct 09, 1970 ? ?REASON FOR VISIT: follow up for intractable nocturnal seizures ?HISTORY FROM: patient, sister ?PRIMARY NEUROLOGIST: Dr. April Manson ? ?HISTORY OF PRESENT ILLNESS: ?Today 02/06/22  ?Sandra Osborne here today for follow-up.  Remains on brand-name Keppra and Zonegran.  She will be going back to her day program.  Has had no changes to her health.  Her seizures have been intractable.  Her sister describes them as only occurring nocturnally, since she was an infant, single jerk, with mouth noise. They sleep together so she hears them. She needs assistance with ADLs.  She is overall in a good mood, good spirits.  Is working on eating healthier, has been able to come off metformin.  Is difficult to get labs.  PCP just got blood work.  We checked blood work at last visit in May 2022, there was not enough blood for Keppra level, Zonegran was on the high normal side 35.8, CBC and CMP were unremarkable. ? ?Update 02/06/2021 SS: Sandra Osborne is a 51 year old female with history of cerebral palsy with right hemiparesis and mental retardation with intractable seizures.  Her seizures are nocturnal.  On Keppra and Zonegran.  She is verbal when she wants to be. Her seizures are described as single jerks nocturnally, very brief, doesn't wake her up.  She is normal the next morning.  None during the day.  No longer seeing her psychiatrist, she left the practice.  Eating good.  No falls.  Has not been seen recently by her primary care doctor.  She has no complaints.  Remains overall stable.  Here today accompanied by her guardian, Rollene Fare. ? ?Update 08/30/2019 SS: Sandra Osborne is a 51 year old female with history of cerebral palsy with a right hemiparesis and mental retardation and intractable seizures.  Her seizures are nocturnal in nature.  She may have 1 or 2 seizures a month.  She remains on relatively high doses of Keppra and Zonegran.  She was recently hospitalized for gallstones and  subsequent cholecystectomy.  She is nonverbal.  Her guardian, Rollene Fare, indicates that over the last month she has had a few more seizure spells maybe 3 this month at night, related to pain and discomfort from her gallbladder.  The spells are described as jerks, always nocturnally, very brief, not violent, and she never wakes up.  She is tolerating Keppra and Zonegran well.  She requires assistance with her daily activities.  She is no longer taking Seroquel, as her psychiatrist left the practice.  She is planning to establish with a new provider.  She presents today for follow-up accompanied by Rollene Fare, who is her guardian and sister. ? ?HISTORY ?08/24/2018 Dr. Jannifer Franklin: Sandra Osborne is a 51 year old black female with a history of cerebral palsy with a right hemiparesis and mental retardation and intractable seizures.  Her seizures generally tend to be nocturnal in nature, she will have 1 or 2 seizures a month.  She remains on relatively high doses of Keppra and Zonegran.  The patient apparently is tolerating the medication fairly well.  The patient is eating well, she has not had any falls.  She has not hurt herself with any of the seizures.  The patient lives with her family who is her caretaker, she does require some assistance with bathing and dressing.  The patient comes to this office for an evaluation.  ? ?REVIEW OF SYSTEMS: Out of a complete 14 system review of symptoms, the patient complains only of the following symptoms, and all  other reviewed systems are negative. ? ?See HPI ? ?ALLERGIES: ?Allergies  ?Allergen Reactions  ? Depakote [Divalproex Sodium] Other (See Comments)  ? Primidone Other (See Comments)  ? ? ?HOME MEDICATIONS: ?Outpatient Medications Prior to Visit  ?Medication Sig Dispense Refill  ? Calcium Carbonate-Vitamin D 600-400 MG-UNIT tablet Take by mouth.    ? cetirizine (ZYRTEC) 10 MG tablet Take 10 mg by mouth daily.    ? docusate sodium (COLACE) 100 MG capsule Take 100 mg by mouth daily.    ?  fexofenadine (ALLEGRA) 180 MG tablet Take 180 mg by mouth daily as needed.    ? fluticasone (FLONASE) 50 MCG/ACT nasal spray     ? lovastatin (MEVACOR) 20 MG tablet Take 20 mg by mouth at bedtime.    ? medroxyPROGESTERone (DEPO-PROVERA) 150 MG/ML injection Inject 1 mL (150 mg total) into the muscle every 3 (three) months. 1 mL 3  ? metoprolol (LOPRESSOR) 50 MG tablet Take 50 mg by mouth daily.     ? quinapril (ACCUPRIL) 10 MG tablet Take 10 mg by mouth daily.    ? KEPPRA 500 MG tablet Take 2 tablets (1,000 mg total) by mouth 2 (two) times daily. 360 tablet 3  ? ZONEGRAN 100 MG capsule Take 3 capsules (300 mg total) by mouth 2 (two) times daily. 540 capsule 3  ? ?No facility-administered medications prior to visit.  ? ? ?PAST MEDICAL HISTORY: ?Past Medical History:  ?Diagnosis Date  ? Cerebral palsy ([place])   ? Diabetes ([place])   ? Dyslipidemia   ? Dysrhythmia   ? tachycardia  ? History of heart disease   ? Hypertension   ? Intermittent explosive disorder   ? Intractable seizures ([place])   ? Mental retardation   ? Obesity   ? ? ?PAST SURGICAL HISTORY: ?Past Surgical History:  ?Procedure Laterality Date  ? CHOLECYSTECTOMY N/A 08/08/2019  ? Procedure: LAPAROSCOPIC CHOLECYSTECTOMY;  Surgeon: Jules Husbands, MD;  Location: [facility] ORS;  Service: General;  Laterality: N/A;  ? COLONOSCOPY WITH PROPOFOL N/A 01/23/2017  ? Procedure: COLONOSCOPY WITH PROPOFOL;  Surgeon: Manya Silvas, MD;  Location[facility] ENDOSCOPY;  Service: Endoscopy;  Laterality: N/A;  ? dental extractions    ? ENDOSCOPIC RETROGRADE CHOLANGIOPANCREATOGRAPHY (ERCP) WITH PROPOFOL N/A 08/05/2019  ? Procedure: ENDOSCOPIC RETROGRADE CHOLANGIOPANCREATOGRAPHY (ERCP) WITH PROPOFOL;  Surgeon: Lucilla Lame, MD;  Location: [facility] ENDOSCOPY;  Service: Endoscopy;  Laterality: N/A;  ? ? ?FAMILY HISTORY: ?Family History  ?Problem Relation Age of Onset  ? Lung cancer Mother   ? Brain cancer Mother   ? Breast cancer Neg Hx   ? ? ?SOCIAL HISTORY: ?Social History  ? ?Socioeconomic  History  ? Marital status: Single  ?  Spouse name: Not on file  ? Number of children: Not on file  ? Years of education: Not on file  ? Highest education level: Not on file  ?Occupational History  ? Not on file  ?Tobacco Use  ? Smoking status: Never  ? Smokeless tobacco: Never  ?Vaping Use  ? Vaping Use: Unknown  ?Substance and Sexual Activity  ? Alcohol use: No  ? Drug use: No  ? Sexual activity: Not on file  ?Other Topics Concern  ? Not on file  ?Social History Narrative  ? The patient is single and lives with her sister.  ? The patient goes to a day program.  ? Patient is left-handed.  ? Patient drinks two caffeine drinks daily.  ?   ?   ?   ? ?Social  Determinants of Health  ? ?Financial Resource Strain: Not on file  ?Food Insecurity: Not on file  ?Transportation Needs: Not on file  ?Physical Activity: Not on file  ?Stress: Not on file  ?Social Connections: Not on file  ?Intimate Partner Violence: Not on file  ? ?PHYSICAL EXAM ? ?Vitals:  ? 02/06/22 1513  ?BP: 114/78  ?Pulse: 69  ?Weight: 150 lb 9.6 oz (68.3 kg)  ? ? ?Body mass index is 27.55 kg/m?. ? ?Generalized: Well developed, in no acute distress  ? ?Neurological examination  ?Mentation: Alert, responds verbally, speech is slurred, difficult to understand, high-pitched, does not follow exam commands well, gets distracted, in good spirits ?Cranial nerve II-XII: Pupils were equal round reactive to light.  ?Motor: Moving all extremities well, increased spasticity to right upper extremity at the elbow and wrist is in flexion ?Sensory: Sensory testing is intact to soft touch on all 4 extremities. No evidence of extinction is noted.  ?Coordination: Able to perform finger-nose-finger with the left hand, otherwise difficulty following commands ?Gait and station: Wide-based gait, hold onto [place], but can walk independently  ?Reflexes: Deep tendon reflexes are symmetric ? ?DIAGNOSTIC DATA (LABS, IMAGING, TESTING) ?- I reviewed patient records, labs, notes, testing  and imaging myself where available. ? ?Lab Results  ?Component Value Date  ? WBC 4.4 02/06/2021  ? HGB 14.4 02/06/2021  ? HCT 44.0 02/06/2021  ? MCV 90 02/06/2021  ? PLT 205 02/06/2021  ? ?   ?Component Value Date/Time

## 2022-02-06 ENCOUNTER — Encounter: Payer: Self-pay | Admitting: Neurology

## 2022-02-06 ENCOUNTER — Ambulatory Visit (INDEPENDENT_AMBULATORY_CARE_PROVIDER_SITE_OTHER): Payer: Medicare Other | Admitting: Neurology

## 2022-02-06 VITALS — BP 114/78 | HR 69 | Wt 150.6 lb

## 2022-02-06 DIAGNOSIS — G40309 Generalized idiopathic epilepsy and epileptic syndromes, not intractable, without status epilepticus: Secondary | ICD-10-CM

## 2022-02-06 DIAGNOSIS — G809 Cerebral palsy, unspecified: Secondary | ICD-10-CM

## 2022-02-06 MED ORDER — KEPPRA 500 MG PO TABS: 1000.0000 mg | ORAL_TABLET | Freq: Two times a day (BID) | ORAL | 3 refills | Status: DC

## 2022-02-06 MED ORDER — ZONEGRAN 100 MG PO CAPS: 300.0000 mg | ORAL_CAPSULE | Freq: Two times a day (BID) | ORAL | 3 refills | Status: DC

## 2022-02-06 NOTE — Patient Instructions (Signed)
Meds ordered this encounter  ?Medications  ? ZONEGRAN 100 MG capsule  ?  Sig: Take 3 capsules (300 mg total) by mouth 2 (two) times daily.  ?  Dispense:  540 capsule  ?  Refill:  3  ? KEPPRA 500 MG tablet  ?  Sig: Take 2 tablets (1,000 mg total) by mouth 2 (two) times daily.  ?  Dispense:  360 tablet  ?  Refill:  3  ? ?Continue current medications ?See you back in 1 year  ?

## 2022-03-06 ENCOUNTER — Encounter: Payer: Self-pay | Admitting: Podiatry

## 2022-03-06 ENCOUNTER — Ambulatory Visit (INDEPENDENT_AMBULATORY_CARE_PROVIDER_SITE_OTHER): Payer: Medicare Other | Admitting: Podiatry

## 2022-03-06 DIAGNOSIS — M79609 Pain in unspecified limb: Secondary | ICD-10-CM

## 2022-03-06 DIAGNOSIS — M2011 Hallux valgus (acquired), right foot: Secondary | ICD-10-CM

## 2022-03-06 DIAGNOSIS — B351 Tinea unguium: Secondary | ICD-10-CM

## 2022-03-06 DIAGNOSIS — E1151 Type 2 diabetes mellitus with diabetic peripheral angiopathy without gangrene: Secondary | ICD-10-CM | POA: Diagnosis not present

## 2022-03-06 DIAGNOSIS — M2010 Hallux valgus (acquired), unspecified foot: Secondary | ICD-10-CM | POA: Diagnosis not present

## 2022-03-06 NOTE — Progress Notes (Signed)
This patient returns to my office for at risk foot care.  This patient requires this care by a professional since this patient will be at risk due to having type 2 diabetes with cerebral palsy.  This patient is unable to cut nails herself since the patient cannot reach her nails.These nails are painful walking and wearing shoes.  Patient is brought to the office by legal guardian.  This patient presents for at risk foot care today.  General Appearance  Alert, conversant and in no acute stress.  Vascular  Dorsalis pedis and posterior tibial  pulses are weakly  palpable  bilaterally.  Capillary return is within normal limits  bilaterally. Temperature is within normal limits  bilaterally.  Neurologic  Senn-Weinstein monofilament wire test diminished   bilaterally. Muscle power within normal limits bilaterally.  Nails Thick disfigured discolored nails with subungual debris  from hallux to fifth toes bilaterally. No evidence of bacterial infection or drainage bilaterally.  Orthopedic  No limitations of motion  feet .  No crepitus or effusions noted.  No bony pathology or digital deformities noted.  Skin  normotropic skin with no porokeratosis noted bilaterally.  No signs of infections or ulcers noted.     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  Consent was obtained for treatment procedures.   Mechanical debridement of nails 1-5  bilaterally performed with a nail nipper.  Filed with dremel without incident.     Return office visit     4  months                 Told patient to return for periodic foot care and evaluation due to potential at risk complications.   Lional Icenogle DPM  

## 2022-06-04 ENCOUNTER — Telehealth: Payer: Self-pay

## 2022-06-04 NOTE — Telephone Encounter (Signed)
A PA has been started on CMM for Zonegran 100 MG capsules. Key: [id number] Key: [id number] - PA Case ID: [id number]

## 2022-06-09 NOTE — Telephone Encounter (Signed)
The PA has been approved. This approval authorizes your coverage from 09/29/2021 - 06/04/2023.

## 2022-06-12 ENCOUNTER — Ambulatory Visit: Payer: Medicare Other | Admitting: Podiatry

## 2022-06-19 ENCOUNTER — Ambulatory Visit (INDEPENDENT_AMBULATORY_CARE_PROVIDER_SITE_OTHER): Payer: Medicare Other | Admitting: Podiatry

## 2022-06-19 ENCOUNTER — Encounter: Payer: Self-pay | Admitting: Podiatry

## 2022-06-19 DIAGNOSIS — M79676 Pain in unspecified toe(s): Secondary | ICD-10-CM

## 2022-06-19 DIAGNOSIS — E1151 Type 2 diabetes mellitus with diabetic peripheral angiopathy without gangrene: Secondary | ICD-10-CM | POA: Diagnosis not present

## 2022-06-19 DIAGNOSIS — B351 Tinea unguium: Secondary | ICD-10-CM

## 2022-06-19 DIAGNOSIS — M2010 Hallux valgus (acquired), unspecified foot: Secondary | ICD-10-CM

## 2022-06-19 DIAGNOSIS — E1142 Type 2 diabetes mellitus with diabetic polyneuropathy: Secondary | ICD-10-CM | POA: Diagnosis not present

## 2022-06-19 DIAGNOSIS — M2011 Hallux valgus (acquired), right foot: Secondary | ICD-10-CM

## 2022-06-19 DIAGNOSIS — M79609 Pain in unspecified limb: Secondary | ICD-10-CM

## 2022-06-19 NOTE — Progress Notes (Signed)
This patient returns to my office for at risk foot care.  This patient requires this care by a professional since this patient will be at risk due to having type 2 diabetes with cerebral palsy.  This patient is unable to cut nails herself since the patient cannot reach her nails.These nails are painful walking and wearing shoes.  Patient is brought to the office by legal guardian.  This patient presents for at risk foot care today.  General Appearance  Alert, conversant and in no acute stress.  Vascular  Dorsalis pedis and posterior tibial  pulses are weakly  palpable  bilaterally.  Capillary return is within normal limits  bilaterally. Temperature is within normal limits  bilaterally.  Neurologic  Senn-Weinstein monofilament wire test diminished   bilaterally. Muscle power within normal limits bilaterally.  Nails Thick disfigured discolored nails with subungual debris  from hallux to fifth toes bilaterally. No evidence of bacterial infection or drainage bilaterally.  Orthopedic  No limitations of motion  feet .  No crepitus or effusions noted.  No bony pathology or digital deformities noted.  Skin  normotropic skin with no porokeratosis noted bilaterally.  No signs of infections or ulcers noted.     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  Consent was obtained for treatment procedures.   Mechanical debridement of nails 1-5  bilaterally performed with a nail nipper.  Filed with dremel without incident.     Return office visit     4  months                 Told patient to return for periodic foot care and evaluation due to potential at risk complications.   Gardiner Barefoot DPM

## 2022-09-18 ENCOUNTER — Ambulatory Visit (INDEPENDENT_AMBULATORY_CARE_PROVIDER_SITE_OTHER): Payer: Medicare Other | Admitting: Podiatry

## 2022-09-18 ENCOUNTER — Encounter: Payer: Self-pay | Admitting: Podiatry

## 2022-09-18 DIAGNOSIS — E1151 Type 2 diabetes mellitus with diabetic peripheral angiopathy without gangrene: Secondary | ICD-10-CM | POA: Diagnosis not present

## 2022-09-18 DIAGNOSIS — M79609 Pain in unspecified limb: Secondary | ICD-10-CM

## 2022-09-18 DIAGNOSIS — E1142 Type 2 diabetes mellitus with diabetic polyneuropathy: Secondary | ICD-10-CM

## 2022-09-18 DIAGNOSIS — M2011 Hallux valgus (acquired), right foot: Secondary | ICD-10-CM

## 2022-09-18 DIAGNOSIS — B351 Tinea unguium: Secondary | ICD-10-CM

## 2022-09-18 DIAGNOSIS — M2010 Hallux valgus (acquired), unspecified foot: Secondary | ICD-10-CM

## 2022-09-18 NOTE — Progress Notes (Signed)
This patient returns to my office for at risk foot care.  This patient requires this care by a professional since this patient will be at risk due to having type 2 diabetes with cerebral palsy.  This patient is unable to cut nails herself since the patient cannot reach her nails.These nails are painful walking and wearing shoes.  Patient is brought to the office by legal guardian.  This patient presents for at risk foot care today.  General Appearance  Alert, conversant and in no acute stress.  Vascular  Dorsalis pedis and posterior tibial  pulses are weakly  palpable  bilaterally.  Capillary return is within normal limits  bilaterally. Temperature is within normal limits  bilaterally.  Neurologic  Senn-Weinstein monofilament wire test diminished   bilaterally. Muscle power within normal limits bilaterally.  Nails Thick disfigured discolored nails with subungual debris  from hallux to fifth toes bilaterally. No evidence of bacterial infection or drainage bilaterally.  Orthopedic  No limitations of motion  feet .  No crepitus or effusions noted.  No bony pathology or digital deformities noted.  Skin  normotropic skin with no porokeratosis noted bilaterally.  No signs of infections or ulcers noted.     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  Consent was obtained for treatment procedures.   Mechanical debridement of nails 1-5  bilaterally performed with a nail nipper.  Filed with dremel without incident.     Return office visit     4  months                 Told patient to return for periodic foot care and evaluation due to potential at risk complications.   Helane Gunther DPM

## 2022-09-23 ENCOUNTER — Telehealth: Payer: Self-pay | Admitting: Neurology

## 2022-09-23 NOTE — Telephone Encounter (Signed)
Called the pharmacy back. It sounds like a prior authorization needed for brand name. A key has been started will complete the PA on CMM.  PA completed on CMM KEY  [id number] Will await determination

## 2022-09-23 NOTE — Telephone Encounter (Signed)
Mandi from [facility] called needing to clarify the pt's Rx for her KEPPRA 500 MG tablet  Please advise.

## 2022-09-23 NOTE — Telephone Encounter (Signed)
Called the pharmacy back to make them aware that Keppra was approved. Approved until Authorization Expiration Date: 09/23/2023 Pharmacist was able to run the script and will get it filled for the pt

## 2022-12-25 ENCOUNTER — Ambulatory Visit: Payer: Medicare Other | Admitting: Podiatry

## 2023-02-06 ENCOUNTER — Encounter: Payer: Self-pay | Admitting: Podiatry

## 2023-02-06 ENCOUNTER — Ambulatory Visit: Payer: Medicare Other | Admitting: Podiatry

## 2023-02-06 ENCOUNTER — Ambulatory Visit (INDEPENDENT_AMBULATORY_CARE_PROVIDER_SITE_OTHER): Payer: Medicare Other | Admitting: Podiatry

## 2023-02-06 DIAGNOSIS — E1151 Type 2 diabetes mellitus with diabetic peripheral angiopathy without gangrene: Secondary | ICD-10-CM

## 2023-02-06 DIAGNOSIS — B351 Tinea unguium: Secondary | ICD-10-CM | POA: Diagnosis not present

## 2023-02-06 DIAGNOSIS — M79609 Pain in unspecified limb: Secondary | ICD-10-CM | POA: Diagnosis not present

## 2023-02-06 NOTE — Progress Notes (Signed)
This patient returns to my office for at risk foot care.  This patient requires this care by a professional since this patient will be at risk due to having type 2 diabetes with cerebral palsy.  This patient is unable to cut nails herself since the patient cannot reach her nails.These nails are painful walking and wearing shoes.  Patient is brought to the office by legal guardian.  This patient presents for at risk foot care today.  General Appearance  Alert, conversant and in no acute stress.  Vascular  Dorsalis pedis and posterior tibial  pulses are weakly  palpable  bilaterally.  Capillary return is within normal limits  bilaterally. Temperature is within normal limits  bilaterally.  Neurologic  Senn-Weinstein monofilament wire test diminished   bilaterally. Muscle power within normal limits bilaterally.  Nails Thick disfigured discolored nails with subungual debris  from hallux to fifth toes bilaterally. No evidence of bacterial infection or drainage bilaterally.  Orthopedic  No limitations of motion  feet .  No crepitus or effusions noted.  No bony pathology or digital deformities noted.  Skin  normotropic skin with no porokeratosis noted bilaterally.  No signs of infections or ulcers noted.     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  Consent was obtained for treatment procedures.   Mechanical debridement of nails 1-5  bilaterally performed with a nail nipper.  Filed with dremel without incident.     Return office visit     4  months                 Told patient to return for periodic foot care and evaluation due to potential at risk complications.   Charee Tumblin DPM  

## 2023-02-12 ENCOUNTER — Encounter: Payer: Self-pay | Admitting: Neurology

## 2023-02-12 ENCOUNTER — Ambulatory Visit (INDEPENDENT_AMBULATORY_CARE_PROVIDER_SITE_OTHER): Payer: Medicare Other | Admitting: Neurology

## 2023-02-12 VITALS — BP 112/78 | HR 78 | Ht 64.0 in | Wt 152.0 lb

## 2023-02-12 DIAGNOSIS — Z5181 Encounter for therapeutic drug level monitoring: Secondary | ICD-10-CM | POA: Diagnosis not present

## 2023-02-12 DIAGNOSIS — G40309 Generalized idiopathic epilepsy and epileptic syndromes, not intractable, without status epilepticus: Secondary | ICD-10-CM

## 2023-02-12 MED ORDER — CENOBAMATE 14 X 12.5 MG & 14 X 25 MG PO TBPK: ORAL_TABLET | ORAL | 0 refills | Status: DC

## 2023-02-12 NOTE — Progress Notes (Signed)
PATIENT: Sandra Osborne DOB: 04-16-1971  REASON FOR VISIT: follow up for intractable nocturnal seizures HISTORY FROM: patient, sister PRIMARY NEUROLOGIST: Dr. Teresa Coombs  HISTORY OF PRESENT ILLNESS: Today 02/13/23  Sandra Osborne presents today for follow-up, she is accompanied by her sister.  Last visit was a year ago.  She remains on brand-name Keppra and Zonegran but still continued to have seizures, 1-2 seizures per week.  Since being on the Zonegran and Keppra they have not tried any additional medication.  Her seizures remain intractable.   Interval History 02/06/2022: here today for follow-up.  Remains on brand-name Keppra and Zonegran.  She will be going back to her day program.  Has had no changes to her health.  Her seizures have been intractable.  Her sister describes them as only occurring nocturnally, since she was an infant, single jerk, with mouth noise. They sleep together so she hears them. She needs assistance with ADLs.  She is overall in a good mood, good spirits.  Is working on eating healthier, has been able to come off metformin.  Is difficult to get labs.  PCP just got blood work.  We checked blood work at last visit in May 2022, there was not enough blood for Keppra level, Zonegran was on the high normal side 35.8, CBC and CMP were unremarkable.  Update 02/06/2021 SS: Sandra Osborne is a 52 year old female with history of cerebral palsy with right hemiparesis and mental retardation with intractable seizures.  Her seizures are nocturnal.  On Keppra and Zonegran.  She is verbal when she wants to be. Her seizures are described as single jerks nocturnally, very brief, doesn't wake her up.  She is normal the next morning.  None during the day.  No longer seeing her psychiatrist, she left the practice.  Eating good.  No falls.  Has not been seen recently by her primary care doctor.  She has no complaints.  Remains overall stable.  Here today accompanied by her guardian,  [name].  Update 08/30/2019 SS: Sandra Osborne is a 52 year old female with history of cerebral palsy with a right hemiparesis and mental retardation and intractable seizures.  Her seizures are nocturnal in nature.  She may have 1 or 2 seizures a month.  She remains on relatively high doses of Keppra and Zonegran.  She was recently hospitalized for gallstones and subsequent cholecystectomy.  She is nonverbal.  Her guardian, [name], indicates that over the last month she has had a few more seizure spells maybe 3 this month at night, related to pain and discomfort from her gallbladder.  The spells are described as jerks, always nocturnally, very brief, not violent, and she never wakes up.  She is tolerating Keppra and Zonegran well.  She requires assistance with her daily activities.  She is no longer taking Seroquel, as her psychiatrist left the practice.  She is planning to establish with a new provider.  She presents today for follow-up accompanied by [name], who is her guardian and sister.  HISTORY 08/24/2018 Dr. Anne Hahn: Sandra Osborne is a 52 year old black female with a history of cerebral palsy with a right hemiparesis and mental retardation and intractable seizures.  Her seizures generally tend to be nocturnal in nature, she will have 1 or 2 seizures a month.  She remains on relatively high doses of Keppra and Zonegran.  The patient apparently is tolerating the medication fairly well.  The patient is eating well, she has not had any falls.  She has  not hurt herself with any of the seizures.  The patient lives with her family who is her caretaker, she does require some assistance with bathing and dressing.  The patient comes to this office for an evaluation.   REVIEW OF SYSTEMS: Out of a complete 14 system review of symptoms, the patient complains only of the following symptoms, and all other reviewed systems are negative.  See HPI  ALLERGIES: Allergies  Allergen Reactions   Depakote [Divalproex Sodium] Other  (See Comments)   Primidone Other (See Comments)    HOME MEDICATIONS: Outpatient Medications Prior to Visit  Medication Sig Dispense Refill   Calcium Carbonate-Vitamin D 600-400 MG-UNIT tablet Take by mouth.     cetirizine (ZYRTEC) 10 MG tablet Take 10 mg by mouth daily.     docusate sodium (COLACE) 100 MG capsule Take 100 mg by mouth daily.     fexofenadine (ALLEGRA) 180 MG tablet Take 180 mg by mouth daily as needed.     fluticasone (FLONASE) 50 MCG/ACT nasal spray      KEPPRA 500 MG tablet Take 2 tablets (1,000 mg total) by mouth 2 (two) times daily. 360 tablet 3   lovastatin (MEVACOR) 20 MG tablet Take 20 mg by mouth at bedtime.     metoprolol (LOPRESSOR) 50 MG tablet Take 50 mg by mouth daily.      quinapril (ACCUPRIL) 10 MG tablet Take 10 mg by mouth daily.     ZONEGRAN 100 MG capsule Take 3 capsules (300 mg total) by mouth 2 (two) times daily. 540 capsule 3   medroxyPROGESTERone (DEPO-PROVERA) 150 MG/ML injection Inject 1 mL (150 mg total) into the muscle every 3 (three) months. (Patient not taking: Reported on 02/12/2023) 1 mL 3   No facility-administered medications prior to visit.    PAST MEDICAL HISTORY: Past Medical History:  Diagnosis Date   Cerebral palsy (HCC)    Diabetes (HCC)    Dyslipidemia    Dysrhythmia    tachycardia   History of heart disease    Hypertension    Intermittent explosive disorder    Intractable seizures (HCC)    Mental retardation    Obesity     PAST SURGICAL HISTORY: Past Surgical History:  Procedure Laterality Date   CHOLECYSTECTOMY N/A 08/08/2019   Procedure: LAPAROSCOPIC CHOLECYSTECTOMY;  Surgeon: Leafy Ro, MD;  Location: [facility] ORS;  Service: General;  Laterality: N/A;   COLONOSCOPY WITH PROPOFOL N/A 01/23/2017   Procedure: COLONOSCOPY WITH PROPOFOL;  Surgeon: Scot Jun, MD;  Location: [facility] ENDOSCOPY;  Service: Endoscopy;  Laterality: N/A;   dental extractions     ENDOSCOPIC RETROGRADE CHOLANGIOPANCREATOGRAPHY (ERCP) WITH  PROPOFOL N/A 08/05/2019   Procedure: ENDOSCOPIC RETROGRADE CHOLANGIOPANCREATOGRAPHY (ERCP) WITH PROPOFOL;  Surgeon: Midge Minium, MD;  Location: [facility] ENDOSCOPY;  Service: Endoscopy;  Laterality: N/A;    FAMILY HISTORY: Family History  Problem Relation Age of Onset   Lung cancer Mother    Brain cancer Mother    Breast cancer Neg Hx     SOCIAL HISTORY: Social History   Socioeconomic History   Marital status: Single    Spouse name: Not on file   Number of children: Not on file   Years of education: Not on file   Highest education level: Not on file  Occupational History   Not on file  Tobacco Use   Smoking status: Never   Smokeless tobacco: Never  Vaping Use   Vaping Use: Unknown  Substance and Sexual Activity   Alcohol use: No  Drug use: No   Sexual activity: Not on file  Other Topics Concern   Not on file  Social History Narrative   The patient is single and lives with her sister.   The patient goes to a day program.   Patient is left-handed.   Patient drinks two caffeine drinks daily.            Social Determinants of Health   Financial Resource Strain: Not on file  Food Insecurity: Not on file  Transportation Needs: Not on file  Physical Activity: Not on file  Stress: Not on file  Social Connections: Not on file  Intimate Partner Violence: Not on file   PHYSICAL EXAM  Vitals:   02/12/23 1440  BP: 112/78  Pulse: 78  Weight: 152 lb (68.9 kg)  Height: 5\' 4"  (1.626 m)     Body mass index is 26.09 kg/m.  Generalized: Well developed, in no acute distress   Neurological examination  Mentation: Alert, responds verbally, speech is slurred, difficult to understand, high-pitched, does not follow exam commands well, gets distracted, in good spirits Cranial nerve II-XII: Pupils were equal round reactive to light.  Motor: Moving all extremities well, increased spasticity to right upper extremity at the elbow and wrist is in flexion Sensory: Sensory testing  is intact to soft touch on all 4 extremities. No evidence of extinction is noted.  Coordination: Able to perform finger-nose-finger with the left hand, otherwise difficulty following commands Gait and station: Wide-based gait, hold onto [place], but can walk independently  Reflexes: Deep tendon reflexes are symmetric  DIAGNOSTIC DATA (LABS, IMAGING, TESTING) - I reviewed patient records, labs, notes, testing and imaging myself where available.  Lab Results  Component Value Date   WBC 4.4 02/06/2021   HGB 14.4 02/06/2021   HCT 44.0 02/06/2021   MCV 90 02/06/2021   PLT 205 02/06/2021      Component Value Date/Time   NA 143 02/12/2023 1516   K 4.0 02/12/2023 1516   CL 108 (H) 02/12/2023 1516   CO2 21 02/12/2023 1516   GLUCOSE 122 (H) 02/12/2023 1516   GLUCOSE 94 08/10/2019 0336   BUN 12 02/12/2023 1516   CREATININE 0.82 02/12/2023 1516   CALCIUM 9.2 02/12/2023 1516   PROT 7.0 02/12/2023 1516   ALBUMIN 4.3 02/12/2023 1516   AST 14 02/12/2023 1516   ALT 14 02/12/2023 1516   ALKPHOS 113 02/12/2023 1516   BILITOT 0.3 02/12/2023 1516   GFRNONAA >60 08/10/2019 0336   GFRAA >60 08/10/2019 0336   No results found for: "CHOL", "HDL", "LDLCALC", "LDLDIRECT", "TRIG", "CHOLHDL" No results found for: "HGBA1C" No results found for: "VITAMINB12" No results found for: "TSH"  ASSESSMENT AND PLAN 52 y.o. year old female  has a past medical history of Cerebral palsy (HCC), Diabetes (HCC), Dyslipidemia, Dysrhythmia, History of heart disease, Hypertension, Intermittent explosive disorder, Intractable seizures (HCC), Mental retardation, and Obesity. here with:  1.  Intractable seizures 2.  Mental retardation  -Continues to have a few nocturnal events, 1 to 2 events per week, have been intractable, will continue current dose of brand-name Zonegran and Keppra -Will add cenobamate 12.5 mg daily with a goal of 150 mg daily -labs today -Discussed all the surgical option like VNS for intractable  seizures but sister is not interested in any surgery.  She is agreeable to adding a third medication which will be sent overnight -Follow-up in 6 months or sooner if worse.    Meds ordered this encounter  Medications  Cenobamate 14 x 12.5 MG & 14 x 25 MG TBPK    Sig: 12.5 mg daily for 14 days followed by 25 mg daily for 14 days    Dispense:  28 tablet    Refill:  0     Windell Norfolk, MD 02/13/2023, 10:11 AM [facility] [address], [address] [place], [place] [address] [phone]

## 2023-02-13 LAB — COMPREHENSIVE METABOLIC PANEL
ALT: 14 IU/L (ref 0–32)
AST: 14 IU/L (ref 0–40)
Albumin/Globulin Ratio: 1.6 (ref 1.2–2.2)
Albumin: 4.3 g/dL (ref 3.8–4.9)
Alkaline Phosphatase: 113 IU/L (ref 44–121)
BUN/Creatinine Ratio: 15 (ref 9–23)
BUN: 12 mg/dL (ref 6–24)
Bilirubin Total: 0.3 mg/dL (ref 0.0–1.2)
CO2: 21 mmol/L (ref 20–29)
Calcium: 9.2 mg/dL (ref 8.7–10.2)
Chloride: 108 mmol/L — ABNORMAL HIGH (ref 96–106)
Creatinine, Ser: 0.82 mg/dL (ref 0.57–1.00)
Globulin, Total: 2.7 g/dL (ref 1.5–4.5)
Glucose: 122 mg/dL — ABNORMAL HIGH (ref 70–99)
Potassium: 4 mmol/L (ref 3.5–5.2)
Sodium: 143 mmol/L (ref 134–144)
Total Protein: 7 g/dL (ref 6.0–8.5)
eGFR: 86 mL/min/{1.73_m2} (ref >59)

## 2023-02-13 LAB — ZONISAMIDE LEVEL: Zonisamide: 32.2 ug/mL (ref 10.0–40.0)

## 2023-02-13 LAB — LEVETIRACETAM LEVEL: Levetiracetam Lvl: 52.8 ug/mL — ABNORMAL HIGH (ref 10.0–40.0)

## 2023-02-13 NOTE — Patient Instructions (Signed)
Continue brand-name Zonegran and Keppra Will check Zonegran and Keppra level today with CMP Add cenobamate 12.5 mg daily for 2 weeks then increase to 50 mg daily for 2 weeks.  Goal will be 150 mg daily.  Follow-up in 6 months or sooner if worse

## 2023-02-15 NOTE — Progress Notes (Signed)
Please call and advise the patient sister Gena that the recent labs we checked were within normal limits. The higher than normal Keppra level reflect the high dose of Keppra she is taking. No further action is required on these tests at this time. Please remind patient to keep any upcoming appointments or tests and to call us with any interim questions, concerns, problems or updates. Thanks,   Windell Norfolk, MD

## 2023-02-17 ENCOUNTER — Telehealth: Payer: Self-pay

## 2023-02-17 NOTE — Telephone Encounter (Signed)
1ST ATTEMPT  Left msg for pt to call back to receive Dr. Karie Georges lab results: Please call and advise the patient sister Gena that the recent labs we checked were within normal limits. The higher than normal Keppra level reflect the high dose of Keppra she is taking. No further action is required on these tests at this time. Please remind patient to keep any upcoming appointments or tests and to call us with any interim questions, concerns, problems or updates. Thanks,    Windell Norfolk, MD

## 2023-02-17 NOTE — Telephone Encounter (Signed)
-----   Message from Glean Salvo, NP sent at 02/16/2023  4:48 PM EDT ----- Regarding: RE: Salomon Fick, I am not prescribing any migraine medications for this patient. She has seizures. Thanks, Sarah  ----- Message ----- From: Eather Colas, CMA Sent: 02/16/2023   1:08 PM EDT To: Glean Salvo, NP Subject: Erenest Rasher is not on formulary: Annia Belt are can they be switched? Thanks,  Cinda Quest ----- Message ----- From: Windell Norfolk, MD Sent: 02/15/2023   9:02 AM EDT To: Gna-Pod 2 Results  Please call and advise the patient sister Gena that the recent labs we checked were within normal limits. The higher than normal Keppra level reflect the high dose of Keppra she is taking. No further action is required on these tests at this time. Please remind patient to keep any upcoming appointments or tests and to call us with any interim questions, concerns, problems or updates. Thanks,   Windell Norfolk, MD

## 2023-02-18 ENCOUNTER — Other Ambulatory Visit: Payer: Self-pay | Admitting: Neurology

## 2023-03-04 ENCOUNTER — Other Ambulatory Visit: Payer: Self-pay | Admitting: Family Medicine

## 2023-03-04 DIAGNOSIS — Z1231 Encounter for screening mammogram for malignant neoplasm of breast: Secondary | ICD-10-CM

## 2023-03-25 ENCOUNTER — Telehealth: Payer: Self-pay | Admitting: Neurology

## 2023-03-25 ENCOUNTER — Telehealth: Payer: Self-pay

## 2023-03-25 ENCOUNTER — Other Ambulatory Visit (HOSPITAL_COMMUNITY): Payer: Self-pay

## 2023-03-25 NOTE — Telephone Encounter (Signed)
[facility]) Insurance requesting prior authorization for  ZONEGRAN 100 MG capsule

## 2023-03-25 NOTE — Telephone Encounter (Signed)
A new telephone call encounter has been made for this PA Request-please see telephone note dated ...03/25/2023 

## 2023-03-25 NOTE — Telephone Encounter (Signed)
Pharmacy Patient Advocate Encounter   Received notification from [employer] that prior authorization for Zonegran 100MG  Capsules is required/requested.   PA submitted to [facility] MEDICAID via FAX-[phone].  Status is pending

## 2023-03-26 ENCOUNTER — Telehealth: Payer: Self-pay

## 2023-03-26 ENCOUNTER — Other Ambulatory Visit (HOSPITAL_COMMUNITY): Payer: Self-pay

## 2023-03-26 NOTE — Telephone Encounter (Signed)
Pa needed for keppra 500mg  tabs

## 2023-03-26 NOTE — Telephone Encounter (Signed)
Pharmacy Patient Advocate Encounter   Received notification from [employer] that prior authorization for Zonegran 100MG  capsules is required/requested.   PA submitted to [facility] via CoverMyMeds Key or (Medicaid) confirmation # [id number] Status is pending

## 2023-03-26 NOTE — Telephone Encounter (Signed)
Pharmacy Patient Advocate Encounter   Received notification from [employer] that prior authorization for Keppra 500MG  tablets is required/requested.   PA submitted to [facility] via CoverMyMeds Key or (Medicaid) confirmation # [id number] Status is pending

## 2023-03-26 NOTE — Telephone Encounter (Signed)
Routing to phone room to please advise pt that the pa was approved

## 2023-03-26 NOTE — Telephone Encounter (Signed)
Called pt. Informed her that Sandra Osborne was approve though insurance and she should be able to pick up from pharmacy.

## 2023-03-26 NOTE — Telephone Encounter (Signed)
Pharmacy Patient Advocate Encounter  Prior Authorization for Keppra 500MG  tablets has been APPROVED by [facility] from 03/12/2023 to until further notice.   PA # PA Case ID #: [id number]

## 2023-03-27 ENCOUNTER — Ambulatory Visit
Admission: RE | Admit: 2023-03-27 | Discharge: 2023-03-27 | Disposition: A | Discharge status: Home / Self Care | Payer: Medicare Other | Source: Ambulatory Visit | Attending: Family Medicine | Admitting: Family Medicine

## 2023-03-27 DIAGNOSIS — Z1231 Encounter for screening mammogram for malignant neoplasm of breast: Secondary | ICD-10-CM | POA: Insufficient documentation

## 2023-03-27 NOTE — Telephone Encounter (Signed)
Pharmacy Patient Advocate Encounter  Prior Authorization for Zonegran 100MG  capsules has been APPROVED by [facility] from 03/27/2023 to until further notice.   PA # PA Case ID: [id number]

## 2023-04-18 ENCOUNTER — Other Ambulatory Visit: Payer: Self-pay | Admitting: Neurology

## 2023-04-20 NOTE — Telephone Encounter (Signed)
Requested Prescriptions   Pending Prescriptions Disp Refills   XCOPRI 14 x 12.5 MG & 14 x 25 MG TBPK [Pharmacy Med Name: XCOPRI 14 X 12.5 MG & 14 X 25 MG TA] 28 each     Sig: TAKE 12.5MG  DAILY FOR 14 DAYS FOLLOWED BY 25MG  FOR 14 DAYS   Last seen 02/12/23, next appt scheduled11/20/24           Start Date End Date Dispense Refills Pharmacy     Cenobamate 14 x 12.5 MG & 14 x 25 MG TBPK 02/12/2023 -- 28 tablet 0 [facility] [place], [place]...   12.5 mg daily for 14 days followed by 25 mg daily for 14 days

## 2023-04-21 NOTE — Telephone Encounter (Signed)
Xcorpu made her groggy, nauseated, vomitting so a month ago the sister stopped giving them to her. She wanted to know if something else could be sent in

## 2023-04-21 NOTE — Telephone Encounter (Signed)
Lvm- 1st attempt

## 2023-04-28 ENCOUNTER — Other Ambulatory Visit: Payer: Self-pay | Admitting: Neurology

## 2023-04-28 MED ORDER — CENOBAMATE 14 X 50 MG & 14 X100 MG PO TBPK: ORAL_TABLET | ORAL | 0 refills | Status: AC

## 2023-05-06 ENCOUNTER — Ambulatory Visit
Admission: EM | Admit: 2023-05-06 | Discharge: 2023-05-06 | Disposition: A | Discharge status: Home / Self Care | Payer: Medicare Other | Attending: Physician Assistant | Admitting: Physician Assistant

## 2023-05-06 DIAGNOSIS — U071 COVID-19: Secondary | ICD-10-CM | POA: Diagnosis not present

## 2023-05-06 DIAGNOSIS — R051 Acute cough: Secondary | ICD-10-CM | POA: Insufficient documentation

## 2023-05-06 LAB — SARS CORONAVIRUS 2 BY RT PCR: SARS Coronavirus 2 by RT PCR: POSITIVE — AB

## 2023-05-06 NOTE — Discharge Instructions (Signed)
Recommend take OTC cough medication such as Delsym every 12 hours as needed for cough. Continue to increase fluids to help loosen up mucus in sinuses and chest. Follow-up in 4 to 5 days if not improving or sooner if worsening.

## 2023-05-06 NOTE — ED Triage Notes (Signed)
runny nose,runny eyes sneezing cough 2 day

## 2023-05-07 NOTE — ED Provider Notes (Signed)
[facility]    CSN: [id number] Arrival date & time: 05/06/23  1349      History   Chief Complaint Chief Complaint  Patient presents with   Cough   Nasal Congestion    HPI Sandra Osborne is a 52 y.o. female.   52 year old female accompanied by her family member who also is here to be seen with similar symptoms, presents with runny nose, cough, nasal congestion, sneezing and watery eyes for the past 5 days. Also has had body aches and low grade fever at first but none now. No vomiting or diarrhea. Has not taken any medication for symptoms. Has history of Cerebral palsy and seizure disorder. Currently on Keppra, Zonegran, and Cenobamate daily. Also has history of HTN, hyperlipidemia and environmental allergies. Currently on Lopressor, Accupril, Lovastatin, Flonase and Zyrtec daily.   The history is provided by a caregiver. The history is limited by a developmental delay (patient with limited verbal skills).    Past Medical History:  Diagnosis Date   Cerebral palsy (HCC)    Diabetes (HCC)    Dyslipidemia    Dysrhythmia    tachycardia   History of heart disease    Hypertension    Intermittent explosive disorder    Intractable seizures (HCC)    Mental retardation    Obesity     Patient Active Problem List   Diagnosis Date Noted   Calculus of gallbladder without cholecystitis without obstruction    RUQ pain    Type 2 diabetes mellitus without complication, without long-term current use of insulin (HCC)    Seizure (HCC)    Cerebral palsy (HCC)    Choledocholithiasis 08/04/2019   Internal hemorrhoid, bleeding    Seizure disorder, 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   Abnormality of gait 12/15/2014   Intellectual disability 07/26/2013   Other forms of epilepsy and recurrent seizures without mention of intractable epilepsy 07/26/2013    Past Surgical History:  Procedure Laterality Date   CHOLECYSTECTOMY N/A 08/08/2019   Procedure: LAPAROSCOPIC  CHOLECYSTECTOMY;  Surgeon: Leafy Ro, MD;  Location: [facility] ORS;  Service: General;  Laterality: N/A;   COLONOSCOPY WITH PROPOFOL N/A 01/23/2017   Procedure: COLONOSCOPY WITH PROPOFOL;  Surgeon: Scot Jun, MD;  Location: [facility] END[facility]COPY;  Service: Endoscopy;  Laterality: N/A;   dental extractions     ENDOSCOPIC RETROGRADE CHOLANGIOPANCREATOGRAPHY (ERCP) WITH PROPOFOL N/A 08/05/2019   Procedure: ENDOSCOPIC RETROGRADE CHOLANGIOPANCREATOGRAPHY (ERCP) WITH PROPOFOL;  Surgeon: Midge Minium, MD;  Location: [facility] ENDOSCOPY;  Service: Endoscopy;  Laterality: N/A;    OB History   No obstetric history on file.      Home Medications    Prior to Admission medications   Medication Sig Start Date End Date Taking? Authorizing Provider  Calcium Carbonate-Vitamin D 600-400 MG-UNIT tablet Take by mouth.    [provider]  Cenobamate 14 x 50 MG & 14 x100 MG TBPK Take 50 mg by mouth daily at 12 noon for 14 days, THEN 100 mg daily at 12 noon for 14 days. 04/28/23 05/26/23  Windell Norfolk, MD  cetirizine (ZYRTEC) 10 MG tablet Take 10 mg by mouth daily.    [provider]  docusate sodium (COLACE) 100 MG capsule Take 100 mg by mouth daily.    [provider]  fluticasone Aleda Grana) 50 MCG/ACT nasal spray  08/19/19   [provider]  KEPPRA 500 MG tablet TAKE 2 TABLETS BY MOUTH TWICE DAILY 02/19/23   Glean Salvo, NP  lovastatin (MEVACOR) 20  MG tablet Take 20 mg by mouth at bedtime.    [provider]  metoprolol (LOPRESSOR) 50 MG tablet Take 50 mg by mouth daily.  09/13/13   [provider]  quinapril (ACCUPRIL) 10 MG tablet Take 10 mg by mouth daily. 09/20/19   [provider]  ZONEGRAN 100 MG capsule TAKE 3 CAPSULES BY MOUTH TWICE DAILY 02/19/23   Glean Salvo, NP    Family History Family History  Problem Relation Age of Onset   Lung cancer Mother    Brain cancer Mother    Breast cancer Neg Hx     Social History Social History    Tobacco Use   Smoking status: Never   Smokeless tobacco: Never  Vaping Use   Vaping status: Unknown  Substance Use Topics   Alcohol use: No   Drug use: No     Allergies   Depakote [divalproex sodium] and Primidone   Review of Systems Review of Systems  Constitutional:  Positive for appetite change, chills, fatigue and fever (at first, none now).  HENT:  Positive for congestion, postnasal drip, rhinorrhea, sneezing and sore throat. Negative for ear discharge, ear pain, nosebleeds and trouble swallowing.   Eyes:  Positive for discharge (clear). Negative for pain.  Respiratory:  Positive for cough. Negative for chest tightness, shortness of breath and wheezing.   Gastrointestinal:  Negative for diarrhea and vomiting.  Musculoskeletal:  Positive for arthralgias and myalgias. Negative for neck pain and neck stiffness.  Skin:  Negative for color change and rash.  Allergic/Immunologic: Positive for environmental allergies. Negative for food allergies.  Neurological:  Positive for seizures (on medication), speech difficulty and headaches. Negative for dizziness, syncope and numbness.  Hematological:  Negative for adenopathy. Does not bruise/bleed easily.     Physical Exam Triage Vital Signs ED Triage Vitals  Encounter Vitals Group     BP 05/06/23 1444 98/70     Systolic BP Percentile --      Diastolic BP Percentile --      Pulse Rate 05/06/23 1438 60     Resp 05/06/23 1438 17     Temp 05/06/23 1438 98 F (36.7 C)     Temp Source 05/06/23 1438 Axillary     SpO2 05/06/23 1438 100 %     Weight 05/06/23 1435 154 lb (69.9 kg)     Height --      Head Circumference --      Peak Flow --      Pain Score 05/06/23 1434 0     Pain Loc --      Pain Education --      Exclude from Growth Chart --    No data found.  Updated Vital Signs BP 98/70 (BP Location: Left Arm)   Pulse 60   Temp 98 F (36.7 C) (Axillary)   Resp 17   Wt 154 lb (69.9 kg)   SpO2 100%   BMI 26.43 kg/m    Visual Acuity Right Eye Distance:   Left Eye Distance:   Bilateral Distance:    Right Eye Near:   Left Eye Near:    Bilateral Near:     Physical Exam Vitals and nursing note reviewed.  Constitutional:      General: She is awake. She is not in acute distress.    Appearance: She is well-developed. She is ill-appearing.     Comments: She is sitting in the exam chair in no acute distress but appears ill and tired. Near end  of exam appeared restless and was her upper body was rocking back and forth.   HENT:     Head: Normocephalic and atraumatic.     Right Ear: Hearing, tympanic membrane, ear canal and external ear normal.     Left Ear: Hearing, tympanic membrane, ear canal and external ear normal.     Nose: Congestion and rhinorrhea present. Rhinorrhea is clear.     Right Sinus: No maxillary sinus tenderness or frontal sinus tenderness.     Left Sinus: No maxillary sinus tenderness or frontal sinus tenderness.     Mouth/Throat:     Lips: Pink.     Mouth: Mucous membranes are moist.     Pharynx: Oropharynx is clear. Uvula midline. Posterior oropharyngeal erythema and postnasal drip present. No oropharyngeal exudate or uvula swelling.  Eyes:     Extraocular Movements: Extraocular movements intact.     Conjunctiva/sclera: Conjunctivae normal.  Cardiovascular:     Rate and Rhythm: Normal rate and regular rhythm.     Heart sounds: Normal heart sounds. No murmur heard. Pulmonary:     Effort: Pulmonary effort is normal. No tachypnea or respiratory distress.     Breath sounds: Normal breath sounds and air entry. No decreased air movement. No decreased breath sounds, wheezing, rhonchi or rales.  Musculoskeletal:     Cervical back: Normal range of motion and neck supple.  Lymphadenopathy:     Cervical: No cervical adenopathy.  Skin:    General: Skin is warm and dry.     Capillary Refill: Capillary refill takes less than 2 seconds.     Findings: No rash.  Neurological:     Mental  Status: She is alert.     Comments: She appears to be oriented to time and place but was non-verbal with provider during exam but would physically respond appropriately when asked.   Psychiatric:        Mood and Affect: Mood normal.        Behavior: Behavior is cooperative.      UC Treatments / Results  Labs (all labs ordered are listed, but only abnormal results are displayed) Labs Reviewed  SARS CORONAVIRUS 2 BY RT PCR - Abnormal; Notable for the following components:      Result Value   SARS Coronavirus 2 by RT PCR POSITIVE (*)    All other components within normal limits    EKG   Radiology No results found.  Procedures Procedures (including critical care time)  Medications Ordered in UC Medications - No data to display  Initial Impression / Assessment and Plan / UC Course  I have reviewed the triage vital signs and the nursing notes.  Pertinent labs & imaging results that were available during my care of the patient were reviewed by me and considered in my medical decision making (see chart for details).     Reviewed with caregiver that patient's COVID 19 test result was positive (caregiver was positive as well). Due to multiple medications and length of symptoms, do not feel antiviral is appropriate at this time. May take OTC Delsym cough syrup 2 teaspoons every 12 hours as needed. Continue Zyrtec and Flonase as directed. Continue to increase fluids to help loosen up mucus in sinuses and chest. Follow-up with her PCP in 4 to 5 days if not improving or sooner if worsening.   Final Clinical Impressions(s) / UC Diagnoses   Final diagnoses:  COVID-19 virus infection  Acute cough     Discharge Instructions  Recommend take OTC cough medication such as Delsym every 12 hours as needed for cough. Continue to increase fluids to help loosen up mucus in sinuses and chest. Follow-up in 4 to 5 days if not improving or sooner if worsening.     ED Prescriptions    None    PDMP not reviewed this encounter.   Sudie Grumbling, NP 05/07/23 1123

## 2023-05-22 ENCOUNTER — Ambulatory Visit: Payer: Medicare Other | Admitting: Podiatry

## 2023-05-29 ENCOUNTER — Ambulatory Visit: Payer: Medicare Other | Admitting: Podiatry

## 2023-06-05 ENCOUNTER — Encounter: Payer: Self-pay | Admitting: Podiatry

## 2023-06-05 ENCOUNTER — Ambulatory Visit (INDEPENDENT_AMBULATORY_CARE_PROVIDER_SITE_OTHER): Payer: Medicare Other | Admitting: Podiatry

## 2023-06-05 DIAGNOSIS — B351 Tinea unguium: Secondary | ICD-10-CM

## 2023-06-05 DIAGNOSIS — M79609 Pain in unspecified limb: Secondary | ICD-10-CM

## 2023-06-05 DIAGNOSIS — E1151 Type 2 diabetes mellitus with diabetic peripheral angiopathy without gangrene: Secondary | ICD-10-CM | POA: Diagnosis not present

## 2023-06-05 DIAGNOSIS — M2011 Hallux valgus (acquired), right foot: Secondary | ICD-10-CM

## 2023-06-05 NOTE — Progress Notes (Signed)
This patient returns to my office for at risk foot care.  This patient requires this care by a professional since this patient will be at risk due to having type 2 diabetes with cerebral palsy.  This patient is unable to cut nails herself since the patient cannot reach her nails.These nails are painful walking and wearing shoes.  Patient is brought to the office by legal guardian.  This patient presents for at risk foot care today.  General Appearance  Alert, conversant and in no acute stress.  Vascular  Dorsalis pedis and posterior tibial  pulses are weakly  palpable  bilaterally.  Capillary return is within normal limits  bilaterally. Temperature is within normal limits  bilaterally.  Neurologic  Senn-Weinstein monofilament wire test diminished   bilaterally. Muscle power within normal limits bilaterally.  Nails Thick disfigured discolored nails with subungual debris  from hallux to fifth toes bilaterally. No evidence of bacterial infection or drainage bilaterally.  Orthopedic  No limitations of motion  feet .  No crepitus or effusions noted.  No bony pathology or digital deformities noted.  Skin  normotropic skin with no porokeratosis noted bilaterally.  No signs of infections or ulcers noted.     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  Consent was obtained for treatment procedures.   Mechanical debridement of nails 1-5  bilaterally performed with a nail nipper.  Filed with dremel without incident.     Return office visit     4  months                 Told patient to return for periodic foot care and evaluation due to potential at risk complications.   Gregory Mayer DPM  

## 2023-06-08 ENCOUNTER — Telehealth: Payer: Self-pay

## 2023-06-08 NOTE — Telephone Encounter (Signed)
*  [facility]  Pharmacy Patient Advocate Encounter   Received notification from CoverMyMeds that prior authorization for Zonegran 100MG  capsules  is required/requested.   Insurance verification completed.   The patient is insured through  [employer]  .   Per test claim: PA required; PA submitted to [facility] [employer] via CoverMyMeds Key/confirmation #/EOC [facility] Status is pending

## 2023-06-10 MED ORDER — ZONISAMIDE 100 MG PO CAPS: 300.0000 mg | ORAL_CAPSULE | Freq: Two times a day (BID) | ORAL | 3 refills | Status: DC

## 2023-06-10 NOTE — Telephone Encounter (Signed)
Maralyn Sago try writing prescription as zonisamide instead of zonegran I think that is the issue. I called the pharmacy help desk and they agreed. Pended the order below

## 2023-06-11 NOTE — Telephone Encounter (Signed)
[facility] -  Has clinical questions [id number] (ZONEGRAN) 100 MG capsule, please call Mandi(pharmacy tech)

## 2023-06-11 NOTE — Telephone Encounter (Signed)
Spoke with Mandi at [facility], Patient has enough medication for 7 days currently. After verbal discussed with Sarah NP, due to patient being on brand Zonegran for long period of time we will complete additional PA to hopefully keep Patient on brand Zonegran. Pharmacy states patient insurance recently changed and that is why there is an issue with coverage at this time. Advise Mandi we will complete PA Mandi in agreement and appreciative of call.

## 2023-06-12 ENCOUNTER — Other Ambulatory Visit (HOSPITAL_COMMUNITY): Payer: Self-pay

## 2023-06-12 NOTE — Telephone Encounter (Signed)
Pharmacy Patient Advocate Encounter  Received notification from  [facility]  that Prior Authorization for Zonegran 100MG  capsules has been DENIED.  Full denial letter will be uploaded to the media tab. See denial reason below.     PA #/Case ID/Reference #: PA Case ID #: [id number]

## 2023-06-12 NOTE — Addendum Note (Signed)
Addended by: Eather Colas E on: 06/12/2023 12:32 PM   Modules accepted: Orders

## 2023-06-12 NOTE — Telephone Encounter (Signed)
Pa needed for zonegran brand expedited

## 2023-06-15 NOTE — Telephone Encounter (Signed)
Called and spoke to regina and asked if pt tried generic she said she is truly unaware of which version she gets but would be agreeable to if insurance covers.

## 2023-06-15 NOTE — Telephone Encounter (Signed)
Sandra Osborne,  They will cover zonisamide. Just not brand. Let me know how you want me to proceed. This may need a medical letter of necessity.  Thanks,  Production assistant, radio

## 2023-06-16 NOTE — Telephone Encounter (Signed)
Luisa Hart (Key: [id number])  form thumbnail Your information has been sent to [facility].

## 2023-06-17 ENCOUNTER — Other Ambulatory Visit: Payer: Self-pay

## 2023-06-17 DIAGNOSIS — G40309 Generalized idiopathic epilepsy and epileptic syndromes, not intractable, without status epilepticus: Secondary | ICD-10-CM

## 2023-06-17 MED ORDER — ZONISAMIDE 100 MG PO CAPS: 300.0000 mg | ORAL_CAPSULE | Freq: Two times a day (BID) | ORAL | 3 refills | Status: DC

## 2023-06-17 NOTE — Addendum Note (Signed)
Addended by: Glean Salvo on: 06/17/2023 10:32 AM   Modules accepted: Orders

## 2023-06-17 NOTE — Progress Notes (Signed)
Discontinued script from historical provider

## 2023-06-17 NOTE — Telephone Encounter (Addendum)
Brand-name Zonegran was denied despite appeal.  Will have to try generic zonisamide.  Her Zonegran level was 32.2 in May 2024.  We could recheck a Zonegran blood level after being on generic for 1 month.  She is seeing Dr. Teresa Coombs in November. She should let us know if she has any issues with generic.  Meds ordered this encounter  Medications   DISCONTD: zonisamide (ZONEGRAN) 100 MG capsule    Sig: Take 3 capsules (300 mg total) by mouth 2 (two) times daily.    Dispense:  180 capsule    Refill:  3   zonisamide (ZONEGRAN) 100 MG capsule    Sig: Take 3 capsules (300 mg total) by mouth 2 (two) times daily.    Dispense:  540 capsule    Refill:  3    Brand name was denied by insurance, will have to try generic

## 2023-06-17 NOTE — Telephone Encounter (Signed)
Call to patients legal guardian [name], she is aware that insurance denied PA and denied Appeal letter for brand name zonegran. She is in agreement to take generic zonisamide and follow back up for lab work in month or if sooner if any breakthrough seizures. [name] appreciative of call.

## 2023-06-17 NOTE — Telephone Encounter (Signed)
Faxed additional questionare to [employer] at [phone]

## 2023-08-19 ENCOUNTER — Encounter: Payer: Self-pay | Admitting: Neurology

## 2023-08-19 ENCOUNTER — Ambulatory Visit (INDEPENDENT_AMBULATORY_CARE_PROVIDER_SITE_OTHER): Payer: Medicare Other | Admitting: Neurology

## 2023-08-19 VITALS — BP 124/72 | Ht 64.0 in | Wt 153.0 lb

## 2023-08-19 DIAGNOSIS — G40309 Generalized idiopathic epilepsy and epileptic syndromes, not intractable, without status epilepticus: Secondary | ICD-10-CM | POA: Diagnosis not present

## 2023-08-19 DIAGNOSIS — G809 Cerebral palsy, unspecified: Secondary | ICD-10-CM

## 2023-08-19 MED ORDER — VALTOCO 15 MG DOSE 7.5 MG/0.1ML NA LQPK: 15.0000 mg | NASAL | 5 refills | Status: DC | PRN

## 2023-08-19 MED ORDER — CLOBAZAM 10 MG PO TABS: 10.0000 mg | ORAL_TABLET | Freq: Every evening | ORAL | 0 refills | Status: DC

## 2023-08-19 NOTE — Patient Instructions (Addendum)
Continue with Keppra 1000 mg twice daily  Continue with Zonisamide 300 mg twice daily  Start Clobazam 5 mg, 1/2 tablet nightly. Please contact us in a month for updates  Valtoco as needed for seizure lasting more than 2 minutes  Return in 6 to 8 months

## 2023-08-19 NOTE — Progress Notes (Signed)
PATIENT: Sandra Osborne DOB: 1971-07-14  REASON FOR VISIT: follow up for intractable nocturnal seizures HISTORY FROM: patient, sister PRIMARY NEUROLOGIST: Dr. Teresa Coombs  HISTORY OF PRESENT ILLNESS: Today 08/19/23  Sandra Osborne presents today for follow-up, she is accompanied by her sister Sandra Osborne.  Last visit was in May, at that time we continued the Keppra and zonisamide and started her on cenobamate.  Sister told me that she uses Cenobamate 12.5 mg daily for the first 4 days but patient was very sleepy, very somnolent therefore she discontinued the medication.  She continued to have seizures, same average 1 to 2/week but tells me that her last seizure was 2 weeks ago.  She is compliant with her medications.  Sister is looking to send her back to the day program.   INTERVAL HISTORY 02/12/2023: Sandra Osborne presents today for follow-up, she is accompanied by her sister.  Last visit was a year ago.  She remains on brand-name Keppra and Zonegran but still continued to have seizures, 1-2 seizures per week.  Since being on the Zonegran and Keppra they have not tried any additional medication.  Her seizures remain intractable.   Interval History 02/06/2022: here today for follow-up.  Remains on brand-name Keppra and Zonegran.  She will be going back to her day program.  Has had no changes to her health.  Her seizures have been intractable.  Her sister describes them as only occurring nocturnally, since she was an infant, single jerk, with mouth noise. They sleep together so she hears them. She needs assistance with ADLs.  She is overall in a good mood, good spirits.  Is working on eating healthier, has been able to come off metformin.  Is difficult to get labs.  PCP just got blood work.  We checked blood work at last visit in May 2022, there was not enough blood for Keppra level, Zonegran was on the high normal side 35.8, CBC and CMP were unremarkable.  Update 02/06/2021 SS: Sandra Osborne is a 52 year old  female with history of cerebral palsy with right hemiparesis and mental retardation with intractable seizures.  Her seizures are nocturnal.  On Keppra and Zonegran.  She is verbal when she wants to be. Her seizures are described as single jerks nocturnally, very brief, doesn't wake her up.  She is normal the next morning.  None during the day.  No longer seeing her psychiatrist, she left the practice.  Eating good.  No falls.  Has not been seen recently by her primary care doctor.  She has no complaints.  Remains overall stable.  Here today accompanied by her guardian, Sandra Osborne.  Update 08/30/2019 SS: Sandra Osborne is a 52 year old female with history of cerebral palsy with a right hemiparesis and mental retardation and intractable seizures.  Her seizures are nocturnal in nature.  She may have 1 or 2 seizures a month.  She remains on relatively high doses of Keppra and Zonegran.  She was recently hospitalized for gallstones and subsequent cholecystectomy.  She is nonverbal.  Her guardian, Sandra Osborne, indicates that over the last month she has had a few more seizure spells maybe 3 this month at night, related to pain and discomfort from her gallbladder.  The spells are described as jerks, always nocturnally, very brief, not violent, and she never wakes up.  She is tolerating Keppra and Zonegran well.  She requires assistance with her daily activities.  She is no longer taking Seroquel, as her psychiatrist left the practice.  She is planning  to establish with a new provider.  She presents today for follow-up accompanied by Sandra Osborne, who is her guardian and sister.  HISTORY 08/24/2018 Dr. Anne Hahn: Sandra Osborne is a 52 year old black female with a history of cerebral palsy with a right hemiparesis and mental retardation and intractable seizures.  Her seizures generally tend to be nocturnal in nature, she will have 1 or 2 seizures a month.  She remains on relatively high doses of Keppra and Zonegran.  The patient apparently is  tolerating the medication fairly well.  The patient is eating well, she has not had any falls.  She has not hurt herself with any of the seizures.  The patient lives with her family who is her caretaker, she does require some assistance with bathing and dressing.  The patient comes to this office for an evaluation.   REVIEW OF SYSTEMS: Out of a complete 14 system review of symptoms, the patient complains only of the following symptoms, and all other reviewed systems are negative.  See HPI  ALLERGIES: Allergies  Allergen Reactions   Depakote [Divalproex Sodium] Other (See Comments)   Primidone Other (See Comments)    HOME MEDICATIONS: Outpatient Medications Prior to Visit  Medication Sig Dispense Refill   Calcium Carbonate-Vitamin D 600-400 MG-UNIT tablet Take by mouth.     cetirizine (ZYRTEC) 10 MG tablet Take 10 mg by mouth daily.     docusate sodium (COLACE) 100 MG capsule Take 100 mg by mouth daily.     fluticasone (FLONASE) 50 MCG/ACT nasal spray      KEPPRA 500 MG tablet TAKE 2 TABLETS BY MOUTH TWICE DAILY 360 tablet 3   lovastatin (MEVACOR) 20 MG tablet Take 20 mg by mouth at bedtime.     quinapril (ACCUPRIL) 10 MG tablet Take 10 mg by mouth daily.     zonisamide (ZONEGRAN) 100 MG capsule Take 3 capsules (300 mg total) by mouth 2 (two) times daily. 540 capsule 3   metoprolol (LOPRESSOR) 50 MG tablet Take 50 mg by mouth daily.      No facility-administered medications prior to visit.    PAST MEDICAL HISTORY: Past Medical History:  Diagnosis Date   Cerebral palsy (HCC)    Diabetes (HCC)    Dyslipidemia    Dysrhythmia    tachycardia   History of heart disease    Hypertension    Intermittent explosive disorder    Intractable seizures (HCC)    Mental retardation    Obesity     PAST SURGICAL HISTORY: Past Surgical History:  Procedure Laterality Date   CHOLECYSTECTOMY N/A 08/08/2019   Procedure: LAPAROSCOPIC CHOLECYSTECTOMY;  Surgeon: Leafy Ro, MD;  Location: [facility]  ORS;  Service: General;  Laterality: N/A;   COLONOSCOPY WITH PROPOFOL N/A 01/23/2017   Procedure: COLONOSCOPY WITH PROPOFOL;  Surgeon: Scot Jun, MD;  Location: [facility] ENDOSCOPY;  Service: Endoscopy;  Laterality: N/A;   dental extractions     ENDOSCOPIC RETROGRADE CHOLANGIOPANCREATOGRAPHY (ERCP) WITH PROPOFOL N/A 08/05/2019   Procedure: ENDOSCOPIC RETROGRADE CHOLANGIOPANCREATOGRAPHY (ERCP) WITH PROPOFOL;  Surgeon: Midge Minium, MD;  Location: [facility] ENDOSCOPY;  Service: Endoscopy;  Laterality: N/A;    FAMILY HISTORY: Family History  Problem Relation Age of Onset   Lung cancer Mother    Brain cancer Mother    Breast cancer Neg Hx     SOCIAL HISTORY: Social History   Socioeconomic History   Marital status: Single    Spouse name: Not on file   Number of children: Not on file   Years  of education: Not on file   Highest education level: Not on file  Occupational History   Not on file  Tobacco Use   Smoking status: Never   Smokeless tobacco: Never  Vaping Use   Vaping status: Unknown  Substance and Sexual Activity   Alcohol use: No   Drug use: No   Sexual activity: Not on file  Other Topics Concern   Not on file  Social History Narrative   The patient is single and lives with her sister.   The patient goes to a day program.   Patient is left-handed.   Patient drinks two caffeine drinks daily.            Social Determinants of Health   Financial Resource Strain: Not on file  Food Insecurity: Not on file  Transportation Needs: Not on file  Physical Activity: Not on file  Stress: Not on file  Social Connections: Not on file  Intimate Partner Violence: Not on file   PHYSICAL EXAM  Vitals:   08/19/23 1419  BP: 124/72  Weight: 153 lb (69.4 kg)  Height: 5\' 4"  (1.626 m)     Body mass index is 26.26 kg/m.  Generalized: Well developed, in no acute distress   Neurological examination  Mentation: Alert, responds minimally, does not follow exam commands well,  gets distracted, in good spirits Cranial nerve II-XII: Pupils were equal round reactive to light.  Motor: Moving all extremities well, increased spasticity to right upper extremity at the elbow and wrist is in flexion Sensory: Sensory testing is intact to soft touch on all 4 extremities. No evidence of extinction is noted.  Coordination: Able to perform finger-nose-finger with the left hand, otherwise difficulty following commands Gait and station: Wide-based gait, hold onto Hunter, but can walk independently    DIAGNOSTIC DATA (LABS, IMAGING, TESTING) - I reviewed patient records, labs, notes, testing and imaging myself where available.  Lab Results  Component Value Date   WBC 4.4 02/06/2021   HGB 14.4 02/06/2021   HCT 44.0 02/06/2021   MCV 90 02/06/2021   PLT 205 02/06/2021      Component Value Date/Time   NA 143 02/12/2023 1516   K 4.0 02/12/2023 1516   CL 108 (H) 02/12/2023 1516   CO2 21 02/12/2023 1516   GLUCOSE 122 (H) 02/12/2023 1516   GLUCOSE 94 08/10/2019 0336   BUN 12 02/12/2023 1516   CREATININE 0.82 02/12/2023 1516   CALCIUM 9.2 02/12/2023 1516   PROT 7.0 02/12/2023 1516   ALBUMIN 4.3 02/12/2023 1516   AST 14 02/12/2023 1516   ALT 14 02/12/2023 1516   ALKPHOS 113 02/12/2023 1516   BILITOT 0.3 02/12/2023 1516   GFRNONAA >60 08/10/2019 0336   GFRAA >60 08/10/2019 0336   No results found for: "CHOL", "HDL", "LDLCALC", "LDLDIRECT", "TRIG", "CHOLHDL" No results found for: "HGBA1C" No results found for: "VITAMINB12" No results found for: "TSH"  ASSESSMENT AND PLAN 52 y.o. year old female  has a past medical history of Cerebral palsy (HCC), Diabetes (HCC), Dyslipidemia, Dysrhythmia, History of heart disease, Hypertension, Intermittent explosive disorder, Intractable seizures (HCC), Mental retardation, and Obesity. here with:  1.  Intractable seizures 2.  Mental retardation  -Continues to have a few nocturnal events, 1 to 2 events per week, have been intractable,  will continue current dose of brand-name Zonegran and Keppra. -Could not tolerate Cenobamate, will start her on Clobazam 5 mg nightly. Sister will contact us with updates in a month  -Discussed surgical option  like VNS for intractable seizures but sister is not interested in any surgery.  She is agreeable to adding a third medication which will be sent overnight -Will also provide rescue medication, Valtoco -Follow-up in 6 months or sooner if worse.    Meds ordered this encounter  Medications   cloBAZam (ONFI) 10 MG tablet    Sig: Take 1 tablet (10 mg total) by mouth at bedtime.    Dispense:  30 tablet    Refill:  0   diazePAM, 15 MG Dose, (VALTOCO 15 MG DOSE) 2 x 7.5 MG/0.1ML LQPK    Sig: Place 15 mg into the nose as needed (For seizure lasting more than 2 minutes).    Dispense:  2 each    Refill:  5    Please provide 2 boxes, a total of 4 doses     Windell Norfolk, MD 08/19/2023, 3:03 PM [facility] [address] [place], [place] [address] [phone]

## 2023-08-20 ENCOUNTER — Telehealth: Payer: Self-pay | Admitting: Neurology

## 2023-08-20 ENCOUNTER — Other Ambulatory Visit: Payer: Self-pay

## 2023-08-20 MED ORDER — VALTOCO 15 MG DOSE 7.5 MG/0.1ML NA LQPK: 15.0000 mg | NASAL | 5 refills | Status: AC | PRN

## 2023-08-20 NOTE — Telephone Encounter (Signed)
Call to pharmacy spoke to same, patient requesting increased quantity to have in multiple locations, new script sent to Dr. Teresa Coombs for approval.

## 2023-08-20 NOTE — Telephone Encounter (Signed)
Mandy(pharmacy tech)[facility] - is asking to be called back re: the Rx needing to be resent for pt's  VALTOCO

## 2023-08-20 NOTE — Progress Notes (Unsigned)
Spoke to Sam at [facility], patient is requesting 4 sprays to have in multiple locations. Resent to Dr. Teresa Coombs to sign

## 2023-09-04 ENCOUNTER — Ambulatory Visit (INDEPENDENT_AMBULATORY_CARE_PROVIDER_SITE_OTHER): Payer: Medicare Other | Admitting: Podiatry

## 2023-09-04 ENCOUNTER — Encounter: Payer: Self-pay | Admitting: Podiatry

## 2023-09-04 DIAGNOSIS — B351 Tinea unguium: Secondary | ICD-10-CM

## 2023-09-04 DIAGNOSIS — M79609 Pain in unspecified limb: Secondary | ICD-10-CM | POA: Diagnosis not present

## 2023-09-04 DIAGNOSIS — E1151 Type 2 diabetes mellitus with diabetic peripheral angiopathy without gangrene: Secondary | ICD-10-CM

## 2023-09-04 NOTE — Progress Notes (Signed)
This patient returns to my office for at risk foot care.  This patient requires this care by a professional since this patient will be at risk due to having type 2 diabetes with cerebral palsy.  This patient is unable to cut nails herself since the patient cannot reach her nails.These nails are painful walking and wearing shoes.  Patient is brought to the office by legal guardian.  This patient presents for at risk foot care today.  General Appearance  Alert, conversant and in no acute stress.  Vascular  Dorsalis pedis and posterior tibial  pulses are weakly  palpable  bilaterally.  Capillary return is within normal limits  bilaterally. Temperature is within normal limits  bilaterally.  Neurologic  Senn-Weinstein monofilament wire test diminished   bilaterally. Muscle power within normal limits bilaterally.  Nails Thick disfigured discolored nails with subungual debris  from hallux to fifth toes bilaterally. No evidence of bacterial infection or drainage bilaterally.  Orthopedic  No limitations of motion  feet .  No crepitus or effusions noted.  No bony pathology or digital deformities noted.  Skin  normotropic skin with no porokeratosis noted bilaterally.  No signs of infections or ulcers noted.     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  Consent was obtained for treatment procedures.   Mechanical debridement of nails 1-5  bilaterally performed with a nail nipper.  Filed with dremel without incident.     Return office visit     4  months                 Told patient to return for periodic foot care and evaluation due to potential at risk complications.   Charee Tumblin DPM  

## 2023-11-05 ENCOUNTER — Telehealth: Payer: Self-pay | Admitting: Podiatry

## 2023-11-05 NOTE — Telephone Encounter (Signed)
 Completed Caregiver paperwork from [place] for Cody Das (Sister and Legal Guardian) ...  Sister needs to be approved for time of when taking [place] to and from medical appointments.   Faxed to [phone]  .Aaron Aas...   J. Abbott -- 11/05/2023

## 2023-11-09 ENCOUNTER — Other Ambulatory Visit: Payer: Self-pay | Admitting: Neurology

## 2023-11-09 ENCOUNTER — Telehealth: Payer: Self-pay | Admitting: Neurology

## 2023-11-09 NOTE — Telephone Encounter (Signed)
 Pt's sister, Shaun Rubel [facility]) said was instructed at last office to call with an update after finishing cloBAZam  (ONFI ) 10 MG tablet (Expired).. Would like a call back.

## 2023-11-09 NOTE — Telephone Encounter (Signed)
 Last seen 08/19/23, next appt 03/24/24  Dispenses   Dispensed Days Supply Quantity Provider Pharmacy  CLOBAZAM      TAB 10MG  08/19/2023 30 30 each Camara, Amadou, MD [facility] - [place],...

## 2023-11-09 NOTE — Telephone Encounter (Signed)
 Refill sent.

## 2023-11-09 NOTE — Telephone Encounter (Signed)
 Returned call to sister, she states patient has done well on clobazam  and would like to continue medication. No seizures notes

## 2023-11-10 ENCOUNTER — Telehealth: Payer: Self-pay | Admitting: Podiatry

## 2023-11-10 NOTE — Telephone Encounter (Signed)
Called Jerl Mina (Sister / Legal Guardian) --- returned her msg) ...   Sd Loletta Parish needs number of hours and frequency ....   [name] said it will be 12 hours every 3 months -- she has to take a whole day off for each appointment - (works 12 hour shifts) -- next appt is in April ....   Faxed revised form to [place] ([phone])   .Marland Kitchen...    J. Abbott -- 11/10/2023

## 2024-01-07 ENCOUNTER — Ambulatory Visit (INDEPENDENT_AMBULATORY_CARE_PROVIDER_SITE_OTHER): Admitting: Podiatry

## 2024-01-07 ENCOUNTER — Encounter: Payer: Self-pay | Admitting: Podiatry

## 2024-01-07 VITALS — Ht 64.0 in | Wt 153.0 lb

## 2024-01-07 DIAGNOSIS — M79676 Pain in unspecified toe(s): Secondary | ICD-10-CM | POA: Diagnosis not present

## 2024-01-07 DIAGNOSIS — M79609 Pain in unspecified limb: Secondary | ICD-10-CM | POA: Diagnosis not present

## 2024-01-07 DIAGNOSIS — E1151 Type 2 diabetes mellitus with diabetic peripheral angiopathy without gangrene: Secondary | ICD-10-CM

## 2024-01-07 DIAGNOSIS — M2011 Hallux valgus (acquired), right foot: Secondary | ICD-10-CM

## 2024-01-07 DIAGNOSIS — B351 Tinea unguium: Secondary | ICD-10-CM

## 2024-01-07 NOTE — Progress Notes (Addendum)
 This patient returns to my office for at risk foot care.  This patient requires this care by a professional since this patient will be at risk due to having type 2 diabetes.  She presents to the office for evaluation and treatment.  General Appearance  Alert, conversant and in no acute stress.  Vascular  Dorsalis pedis and posterior tibial  pulses are palpable  bilaterally.  Capillary return is within normal limits  bilaterally. Temperature is within normal limits  bilaterally.  Neurologic  Senn-Weinstein monofilament wire test diminished   bilaterally. Muscle power within normal limits bilaterally.  Nails Thick disfigured discolored nails with subungual debris  from hallux to fifth toes bilaterally. No evidence of bacterial infection or drainage bilaterally.  Orthopedic  No limitations of motion  feet .  No crepitus or effusions noted.  No bony pathology or digital deformities noted.  Skin  normotropic skin with no porokeratosis noted bilaterally.  No signs of infections or ulcers noted.     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  Consent was obtained for treatment procedures.   Mechanical debridement of nails 1-5  bilaterally performed with a nail nipper.  Filed with dremel without incident.     Return office visit     4  months                 Told patient to return for periodic foot care and evaluation due to potential at risk complications.   Helane Gunther DPM

## 2024-01-08 ENCOUNTER — Ambulatory Visit: Payer: Medicare Other | Admitting: Podiatry

## 2024-02-23 ENCOUNTER — Other Ambulatory Visit: Payer: Self-pay | Admitting: Neurology

## 2024-03-24 ENCOUNTER — Ambulatory Visit: Payer: Medicare Other | Admitting: Neurology

## 2024-03-24 ENCOUNTER — Encounter: Payer: Self-pay | Admitting: Neurology

## 2024-03-24 VITALS — BP 138/88 | Resp 15 | Ht 63.0 in | Wt 153.5 lb

## 2024-03-24 DIAGNOSIS — G40309 Generalized idiopathic epilepsy and epileptic syndromes, not intractable, without status epilepticus: Secondary | ICD-10-CM | POA: Diagnosis not present

## 2024-03-24 DIAGNOSIS — G809 Cerebral palsy, unspecified: Secondary | ICD-10-CM | POA: Diagnosis not present

## 2024-03-24 MED ORDER — CLOBAZAM 10 MG PO TABS: 10.0000 mg | ORAL_TABLET | Freq: Every day | ORAL | 5 refills | Status: DC

## 2024-03-24 NOTE — Patient Instructions (Addendum)
 Restart clobazam  5 mg nightly Decrease Zonisamide  to 200 mg twice daily Continue levetiracetam  1000 mg twice daily Follow-up in 6 months or sooner if worse Please contact me if you do have any side effect from the medication, any questions, concerns or any breakthrough seizure.

## 2024-03-24 NOTE — Progress Notes (Signed)
 PATIENT: Sandra Osborne DOB: April 14, 1971  REASON FOR VISIT: follow up for intractable nocturnal seizures HISTORY FROM: patient, sister PRIMARY NEUROLOGIST: Dr. Gregg  HISTORY OF PRESENT ILLNESS: Today 03/24/24  Sandra Osborne presents today for follow-up, she is accompanied by her sister.  Last visit was seen November at that time we started her on clobazam  5 mg nightly.  Sister tells me while on clobazam  she was doing very well, no seizures but she was having some somnolence or behavior disruption in the morning. They ran out of clobazam  a week ago and she did have some breakthrough seizures requiring Valtoco  administration.    INTERVAL HISTORY 08/19/2023:  presents today for follow-up, she is accompanied by her sister Sandra Osborne.  Last visit was in May, at that time we continued the Keppra  and zonisamide  and started her on cenobamate .  Sister told me that she uses Cenobamate  12.5 mg daily for the first 4 days but patient was very sleepy, very somnolent therefore she discontinued the medication.  She continued to have seizures, same average 1 to 2/week but tells me that her last seizure was 2 weeks ago.  She is compliant with her medications.  Sister is looking to send her back to the day program.   INTERVAL HISTORY 02/12/2023: Raja presents today for follow-up, she is accompanied by her sister.  Last visit was a year ago.  She remains on brand-name Keppra  and Zonegran  but still continued to have seizures, 1-2 seizures per week.  Since being on the Zonegran  and Keppra  they have not tried any additional medication.  Her seizures remain intractable.   Interval History 02/06/2022: here today for follow-up.  Remains on brand-name Keppra  and Zonegran .  She will be going back to her day program.  Has had no changes to her health.  Her seizures have been intractable.  Her sister describes them as only occurring nocturnally, since she was an infant, single jerk, with mouth noise. They sleep  together so she hears them. She needs assistance with ADLs.  She is overall in a good mood, good spirits.  Is working on eating healthier, has been able to come off metformin.  Is difficult to get labs.  PCP just got blood work.  We checked blood work at last visit in May 2022, there was not enough blood for Keppra  level, Zonegran  was on the high normal side 35.8, CBC and CMP were unremarkable.  Update 02/06/2021 SS: Sandra Osborne is a 53 year old female with history of cerebral palsy with right hemiparesis and mental retardation with intractable seizures.  Her seizures are nocturnal.  On Keppra  and Zonegran .  She is verbal when she wants to be. Her seizures are described as single jerks nocturnally, very brief, doesn't wake her up.  She is normal the next morning.  None during the day.  No longer seeing her psychiatrist, she left the practice.  Eating good.  No falls.  Has not been seen recently by her primary care doctor.  She has no complaints.  Remains overall stable.  Here today accompanied by her guardian, Sandra Osborne.  Update 08/30/2019 SS: Sandra Osborne is a 52 year old female with history of cerebral palsy with a right hemiparesis and mental retardation and intractable seizures.  Her seizures are nocturnal in nature.  She may have 1 or 2 seizures a month.  She remains on relatively high doses of Keppra  and Zonegran .  She was recently hospitalized for gallstones and subsequent cholecystectomy.  She is nonverbal.  Her guardian, Sandra Osborne, indicates  that over the last month she has had a few more seizure spells maybe 3 this month at night, related to pain and discomfort from her gallbladder.  The spells are described as jerks, always nocturnally, very brief, not violent, and she never wakes up.  She is tolerating Keppra  and Zonegran  well.  She requires assistance with her daily activities.  She is no longer taking Seroquel , as her psychiatrist left the practice.  She is planning to establish with a new provider.  She  presents today for follow-up accompanied by Sandra Osborne, who is her guardian and sister.  HISTORY 08/24/2018 Dr. Jenel: Sandra Osborne is a 53 year old black female with a history of cerebral palsy with a right hemiparesis and mental retardation and intractable seizures.  Her seizures generally tend to be nocturnal in nature, she will have 1 or 2 seizures a month.  She remains on relatively high doses of Keppra  and Zonegran .  The patient apparently is tolerating the medication fairly well.  The patient is eating well, she has not had any falls.  She has not hurt herself with any of the seizures.  The patient lives with her family who is her caretaker, she does require some assistance with bathing and dressing.  The patient comes to this office for an evaluation.   REVIEW OF SYSTEMS: Out of a complete 14 system review of symptoms, the patient complains only of the following symptoms, and all other reviewed systems are negative.  See HPI  ALLERGIES: Allergies  Allergen Reactions   Depakote [Divalproex Sodium] Other (See Comments)   Primidone Other (See Comments)    HOME MEDICATIONS: Outpatient Medications Prior to Visit  Medication Sig Dispense Refill   Calcium  Carbonate-Vitamin D  600-400 MG-UNIT tablet Take by mouth.     cetirizine (ZYRTEC) 10 MG tablet Take 10 mg by mouth daily.     diazePAM , 15 MG Dose, (VALTOCO  15 MG DOSE) 2 x 7.5 MG/0.1ML LQPK Place 15 mg into the nose as needed (For seizure lasting more than 2 minutes). 4 each 5   docusate sodium  (COLACE) 100 MG capsule Take 100 mg by mouth daily.     fluticasone (FLONASE) 50 MCG/ACT nasal spray      KEPPRA  500 MG tablet TAKE 2 TABLETS BY MOUTH TWICE DAILY 360 tablet 3   lovastatin (MEVACOR) 20 MG tablet Take 20 mg by mouth at bedtime.     metoprolol  (LOPRESSOR ) 50 MG tablet Take 50 mg by mouth daily.      quinapril (ACCUPRIL) 10 MG tablet Take 10 mg by mouth daily.     zonisamide  (ZONEGRAN ) 100 MG capsule Take 3 capsules (300 mg total) by  mouth 2 (two) times daily. 540 capsule 3   cloBAZam  (ONFI ) 10 MG tablet TAKE 1 TABLET BY MOUTH AT BEDTIME 30 tablet 5   No facility-administered medications prior to visit.    PAST MEDICAL HISTORY: Past Medical History:  Diagnosis Date   Cerebral palsy (HCC)    Diabetes (HCC)    Dyslipidemia    Dysrhythmia    tachycardia   History of heart disease    Hypertension    Intermittent explosive disorder    Intractable seizures (HCC)    Mental retardation    Obesity     PAST SURGICAL HISTORY: Past Surgical History:  Procedure Laterality Date   CHOLECYSTECTOMY N/A 08/08/2019   Procedure: LAPAROSCOPIC CHOLECYSTECTOMY;  Surgeon: Jordis Laneta FALCON, MD;  Location: [facility] ORS;  Service: General;  Laterality: N/A;   COLONOSCOPY WITH PROPOFOL  N/A 01/23/2017  Procedure: COLONOSCOPY WITH PROPOFOL ;  Surgeon: Lamar ONEIDA Holmes, MD;  Location: [facility] ENDOSCOPY;  Service: Endoscopy;  Laterality: N/A;   dental extractions     ENDOSCOPIC RETROGRADE CHOLANGIOPANCREATOGRAPHY (ERCP) WITH PROPOFOL  N/A 08/05/2019   Procedure: ENDOSCOPIC RETROGRADE CHOLANGIOPANCREATOGRAPHY (ERCP) WITH PROPOFOL ;  Surgeon: Jinny Carmine, MD;  Location: [facility] ENDOSCOPY;  Service: Endoscopy;  Laterality: N/A;    FAMILY HISTORY: Family History  Problem Relation Age of Onset   Lung cancer Mother    Brain cancer Mother    Breast cancer Neg Hx     SOCIAL HISTORY: Social History   Socioeconomic History   Marital status: Single    Spouse name: Not on file   Number of children: Not on file   Years of education: Not on file   Highest education level: Not on file  Occupational History   Not on file  Tobacco Use   Smoking status: Never   Smokeless tobacco: Never  Vaping Use   Vaping status: Unknown  Substance and Sexual Activity   Alcohol use: No   Drug use: No   Sexual activity: Not on file  Other Topics Concern   Not on file  Social History Narrative   The patient is single and lives with her sister.   The patient goes  to a day program.   Patient is left-handed.   Patient drinks two caffeine drinks daily.            Social Drivers of Corporate investment banker Strain: Not on file  Food Insecurity: Not on file  Transportation Needs: Not on file  Physical Activity: Not on file  Stress: Not on file  Social Connections: Not on file  Intimate Partner Violence: Not on file   PHYSICAL EXAM  Vitals:   03/24/24 1435  BP: 138/88  Resp: 15  Weight: 153 lb 8 oz (69.6 kg)  Height: 5' 3 (1.6 m)     Body mass index is 27.19 kg/m.  Generalized: Well developed, in no acute distress   Neurological examination  Mentation: Alert, responds minimally, does not follow exam commands well, gets distracted, in good spirits Cranial nerve II-XII: Pupils were equal round reactive to light.  Motor: Moving all extremities well, increased spasticity to right upper extremity at the elbow and wrist is in flexion Sensory: Sensory testing is intact to soft touch on all 4 extremities. No evidence of extinction is noted.  Coordination: Able to perform finger-nose-finger with the left hand, otherwise difficulty following commands Gait and station: Wide-based gait, hold onto [place], but can walk independently    DIAGNOSTIC DATA (LABS, IMAGING, TESTING) - I reviewed patient records, labs, notes, testing and imaging myself where available.  Lab Results  Component Value Date   WBC 4.4 02/06/2021   HGB 14.4 02/06/2021   HCT 44.0 02/06/2021   MCV 90 02/06/2021   PLT 205 02/06/2021      Component Value Date/Time   NA 143 02/12/2023 1516   K 4.0 02/12/2023 1516   CL 108 (H) 02/12/2023 1516   CO2 21 02/12/2023 1516   GLUCOSE 122 (H) 02/12/2023 1516   GLUCOSE 94 08/10/2019 0336   BUN 12 02/12/2023 1516   CREATININE 0.82 02/12/2023 1516   CALCIUM  9.2 02/12/2023 1516   PROT 7.0 02/12/2023 1516   ALBUMIN 4.3 02/12/2023 1516   AST 14 02/12/2023 1516   ALT 14 02/12/2023 1516   ALKPHOS 113 02/12/2023 1516   BILITOT  0.3 02/12/2023 1516   GFRNONAA >60 08/10/2019 9663  GFRAA >60 08/10/2019 0336   No results found for: CHOL, HDL, LDLCALC, LDLDIRECT, TRIG, CHOLHDL No results found for: YHAJ8R No results found for: VITAMINB12 No results found for: TSH  ASSESSMENT AND PLAN 53 y.o. year old female  has a past medical history of Cerebral palsy (HCC), Diabetes (HCC), Dyslipidemia, Dysrhythmia, History of heart disease, Hypertension, Intermittent explosive disorder, Intractable seizures (HCC), Mental retardation, and Obesity. here with:  1.  Intractable seizures 2.  Mental retardation   Patient Instructions  Restart clobazam  5 mg nightly Decrease Zonisamide  to 200 mg twice daily Continue levetiracetam  1000 mg twice daily Follow-up in 6 months or sooner if worse Please contact me if you do have any side effect from the medication, any questions, concerns or any breakthrough seizure.    Meds ordered this encounter  Medications   cloBAZam  (ONFI ) 10 MG tablet    Sig: Take 1 tablet (10 mg total) by mouth at bedtime.    Dispense:  30 tablet    Refill:  5   The patient's condition of epilepsy requires frequent monitoring and adjustments in the treatment plan, reflecting the ongoing complexity of care.  This provider is the continuing focal point for all needed services for this condition.   Pastor Falling, MD 03/24/2024, 5:06 PM [facility] [address] [place], [place] [address] [phone]

## 2024-04-07 ENCOUNTER — Ambulatory Visit: Admitting: Podiatry

## 2024-04-29 ENCOUNTER — Ambulatory Visit: Admitting: Podiatry

## 2024-04-29 ENCOUNTER — Telehealth: Payer: Self-pay

## 2024-04-29 ENCOUNTER — Encounter: Payer: Self-pay | Admitting: Podiatry

## 2024-04-29 ENCOUNTER — Other Ambulatory Visit (HOSPITAL_COMMUNITY): Payer: Self-pay

## 2024-04-29 DIAGNOSIS — M79609 Pain in unspecified limb: Secondary | ICD-10-CM

## 2024-04-29 DIAGNOSIS — E1151 Type 2 diabetes mellitus with diabetic peripheral angiopathy without gangrene: Secondary | ICD-10-CM | POA: Diagnosis not present

## 2024-04-29 DIAGNOSIS — M79676 Pain in unspecified toe(s): Secondary | ICD-10-CM | POA: Diagnosis not present

## 2024-04-29 DIAGNOSIS — B351 Tinea unguium: Secondary | ICD-10-CM | POA: Diagnosis not present

## 2024-04-29 NOTE — Telephone Encounter (Signed)
 Pharmacy Patient Advocate Encounter   Received notification from Fax that prior authorization for cloBAZam  10MG  tablets is required/requested.   Insurance verification completed.   The patient is insured through [facility] .   Per test claim: PA required; PA submitted to above mentioned insurance via CoverMyMeds Key/confirmation #/EOC [id number] Status is pending

## 2024-04-29 NOTE — Progress Notes (Signed)
 This patient returns to my office for at risk foot care.  This patient requires this care by a professional since this patient will be at risk due to having type 2 diabetes.  She presents to the office for evaluation and treatment.  General Appearance  Alert, conversant and in no acute stress.  Vascular  Dorsalis pedis and posterior tibial  pulses are palpable  bilaterally.  Capillary return is within normal limits  bilaterally. Temperature is within normal limits  bilaterally.  Neurologic  Senn-Weinstein monofilament wire test diminished   bilaterally. Muscle power within normal limits bilaterally.  Nails Thick disfigured discolored nails with subungual debris  from hallux to fifth toes bilaterally. No evidence of bacterial infection or drainage bilaterally.  Orthopedic  No limitations of motion  feet .  No crepitus or effusions noted.  No bony pathology or digital deformities noted.  Skin  normotropic skin with no porokeratosis noted bilaterally.  No signs of infections or ulcers noted.     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  Consent was obtained for treatment procedures.   Mechanical debridement of nails 1-5  bilaterally performed with a nail nipper.  Filed with dremel without incident.     Return office visit     4  months                 Told patient to return for periodic foot care and evaluation due to potential at risk complications.   Helane Gunther DPM

## 2024-05-03 ENCOUNTER — Other Ambulatory Visit (HOSPITAL_COMMUNITY): Payer: Self-pay

## 2024-05-03 NOTE — Telephone Encounter (Signed)
 Pharmacy Patient Advocate Encounter  Received notification from [employer] that Prior Authorization for cloBAZam  10MG  tablets has been APPROVED from 04/22/2024 to 09/28/2098. Unable to obtain price due to refill too soon rejection, last fill date 04/22/2024 next available fill date8/17/2025   PA #/Case ID/Reference #: PA Case ID #: [id number]

## 2024-06-21 ENCOUNTER — Other Ambulatory Visit: Payer: Self-pay | Admitting: Neurology

## 2024-06-21 NOTE — Telephone Encounter (Signed)
  Per last note. Medication pended and sent to provider

## 2024-06-22 MED ORDER — ZONISAMIDE 100 MG PO CAPS: 200.0000 mg | ORAL_CAPSULE | Freq: Two times a day (BID) | ORAL | 3 refills | Status: AC

## 2024-06-22 NOTE — Addendum Note (Signed)
 Addended byBETHA GREGG LEK on: 06/22/2024 05:07 PM   Modules accepted: Orders

## 2024-06-22 NOTE — Telephone Encounter (Signed)
 [facility] attempted to fill yesterday for zonisamide  (ZONEGRAN ) 100 MG capsule.Requesting prescription due to zonisamide  (ZONEGRAN ) 100 MG capsule dosage has changed.

## 2024-06-22 NOTE — Telephone Encounter (Signed)
 [facility] [employer]) tried to refill yesterday and insurance denied due to a change on the prescription. Can contact at [phone]

## 2024-07-11 ENCOUNTER — Telehealth: Payer: Self-pay | Admitting: Neurology

## 2024-07-11 NOTE — Telephone Encounter (Signed)
 Pt's sister called wanting the pt to be seen today stating that the medication that she has been put on has her waking up screaming and hollering. Sister states it was so bad that she had to call off of work today to stay with the pt. Please advise.

## 2024-07-11 NOTE — Telephone Encounter (Signed)
 Called pt sister who stated that the pt is at the urgent care to be treated today to see if she may have uti instead since we didn't get her in

## 2024-07-12 ENCOUNTER — Emergency Department
Admission: EM | Admit: 2024-07-12 | Discharge: 2024-07-12 | Disposition: A | Discharge status: Home / Self Care | Attending: Emergency Medicine | Admitting: Emergency Medicine

## 2024-07-12 ENCOUNTER — Emergency Department

## 2024-07-12 ENCOUNTER — Other Ambulatory Visit: Payer: Self-pay

## 2024-07-12 DIAGNOSIS — Y92410 Unspecified street and highway as the place of occurrence of the external cause: Secondary | ICD-10-CM | POA: Diagnosis not present

## 2024-07-12 DIAGNOSIS — E119 Type 2 diabetes mellitus without complications: Secondary | ICD-10-CM | POA: Insufficient documentation

## 2024-07-12 DIAGNOSIS — M791 Myalgia, unspecified site: Secondary | ICD-10-CM | POA: Diagnosis present

## 2024-07-12 DIAGNOSIS — I10 Essential (primary) hypertension: Secondary | ICD-10-CM | POA: Diagnosis not present

## 2024-07-12 DIAGNOSIS — R1084 Generalized abdominal pain: Secondary | ICD-10-CM | POA: Diagnosis not present

## 2024-07-12 DIAGNOSIS — M7918 Myalgia, other site: Secondary | ICD-10-CM

## 2024-07-12 LAB — CBC WITH DIFFERENTIAL/PLATELET
Abs Immature Granulocytes: 0.01 K/uL (ref 0.00–0.07)
Basophils Absolute: 0 K/uL (ref 0.0–0.1)
Basophils Relative: 1 %
Eosinophils Absolute: 0 K/uL (ref 0.0–0.5)
Eosinophils Relative: 1 %
HCT: 44.3 % (ref 36.0–46.0)
Hemoglobin: 14 g/dL (ref 12.0–15.0)
Immature Granulocytes: 0 %
Lymphocytes Relative: 29 %
Lymphs Abs: 1.4 K/uL (ref 0.7–4.0)
MCH: 29.4 pg (ref 26.0–34.0)
MCHC: 31.6 g/dL (ref 30.0–36.0)
MCV: 93.1 fL (ref 80.0–100.0)
Monocytes Absolute: 0.5 K/uL (ref 0.1–1.0)
Monocytes Relative: 11 %
Neutro Abs: 2.8 K/uL (ref 1.7–7.7)
Neutrophils Relative %: 58 %
Platelets: 192 K/uL (ref 150–400)
RBC: 4.76 MIL/uL (ref 3.87–5.11)
RDW: 12 % (ref 11.5–15.5)
WBC: 4.8 K/uL (ref 4.0–10.5)
nRBC: 0 % (ref 0.0–0.2)

## 2024-07-12 LAB — BASIC METABOLIC PANEL WITH GFR
Anion gap: 9 (ref 5–15)
BUN: 11 mg/dL (ref 6–20)
CO2: 24 mmol/L (ref 22–32)
Calcium: 9.1 mg/dL (ref 8.9–10.3)
Chloride: 110 mmol/L (ref 98–111)
Creatinine, Ser: 0.9 mg/dL (ref 0.44–1.00)
GFR, Estimated: >60 mL/min (ref >60)
Glucose, Bld: 100 mg/dL — ABNORMAL HIGH (ref 70–99)
Potassium: 4.1 mmol/L (ref 3.5–5.1)
Sodium: 143 mmol/L (ref 135–145)

## 2024-07-12 LAB — HEMOGLOBIN A1C
Hgb A1c MFr Bld: 5.2 % (ref 4.8–5.6)
Mean Plasma Glucose: 102.54 mg/dL

## 2024-07-12 MED ORDER — IOHEXOL 300 MG/ML  SOLN
100.0000 mL | Freq: Once | INTRAMUSCULAR | Status: AC | PRN
  Administered 2024-07-12: 100 mL via INTRAVENOUS

## 2024-07-12 NOTE — ED Notes (Signed)
 Legal guardian in ED with patient at this time. Here to also be seen.
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
## 2024-07-12 NOTE — ED Notes (Signed)
 Patient back from CT
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
## 2024-07-12 NOTE — ED Notes (Signed)
 Patient to CT via wheelchair.

## 2024-07-12 NOTE — ED Notes (Signed)
Attempted to call legal guardian with no answer.  °

## 2024-07-15 ENCOUNTER — Emergency Department

## 2024-07-15 ENCOUNTER — Other Ambulatory Visit: Payer: Self-pay

## 2024-07-15 ENCOUNTER — Encounter: Payer: Self-pay | Admitting: Physician Assistant

## 2024-07-15 ENCOUNTER — Emergency Department
Admission: EM | Admit: 2024-07-15 | Discharge: 2024-07-15 | Disposition: A | Discharge status: Home / Self Care | Attending: Emergency Medicine | Admitting: Emergency Medicine

## 2024-07-15 DIAGNOSIS — M7918 Myalgia, other site: Secondary | ICD-10-CM | POA: Diagnosis not present

## 2024-07-15 DIAGNOSIS — R519 Headache, unspecified: Secondary | ICD-10-CM | POA: Diagnosis present

## 2024-07-15 DIAGNOSIS — I10 Essential (primary) hypertension: Secondary | ICD-10-CM | POA: Diagnosis not present

## 2024-07-15 DIAGNOSIS — E119 Type 2 diabetes mellitus without complications: Secondary | ICD-10-CM | POA: Diagnosis not present

## 2024-07-15 DIAGNOSIS — R2990 Unspecified symptoms and signs involving the nervous system: Secondary | ICD-10-CM

## 2024-07-15 DIAGNOSIS — M899 Disorder of bone, unspecified: Secondary | ICD-10-CM
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
## 2024-07-19 ENCOUNTER — Other Ambulatory Visit: Payer: Self-pay | Admitting: Physician Assistant

## 2024-07-19 DIAGNOSIS — M899 Disorder of bone, unspecified: Secondary | ICD-10-CM

## 2024-07-25 ENCOUNTER — Other Ambulatory Visit: Payer: Self-pay | Admitting: Physician Assistant

## 2024-07-25 DIAGNOSIS — R935 Abnormal findings on diagnostic imaging of other abdominal regions, including retroperitoneum: Secondary | ICD-10-CM

## 2024-07-25 DIAGNOSIS — M899 Disorder of bone, unspecified: Secondary | ICD-10-CM

## 2024-07-29 ENCOUNTER — Ambulatory Visit: Admitting: Podiatry

## 2024-08-02 ENCOUNTER — Encounter: Payer: Self-pay | Admitting: Podiatry

## 2024-08-02 ENCOUNTER — Ambulatory Visit: Admitting: Podiatry

## 2024-08-02 DIAGNOSIS — B351 Tinea unguium: Secondary | ICD-10-CM | POA: Diagnosis not present

## 2024-08-02 DIAGNOSIS — E1151 Type 2 diabetes mellitus with diabetic peripheral angiopathy without gangrene: Secondary | ICD-10-CM

## 2024-08-02 DIAGNOSIS — M79609 Pain in unspecified limb: Secondary | ICD-10-CM | POA: Diagnosis not present

## 2024-08-02 NOTE — Progress Notes (Signed)
 This patient returns to my office for at risk foot care.  This patient requires this care by a professional since this patient will be at risk due to having type 2 diabetes.  She presents to the office for evaluation and treatment.  General Appearance  Alert, conversant and in no acute stress.  Vascular  Dorsalis pedis and posterior tibial  pulses are palpable  bilaterally.  Capillary return is within normal limits  bilaterally. Temperature is within normal limits  bilaterally.  Neurologic  Senn-Weinstein monofilament wire test diminished   bilaterally. Muscle power within normal limits bilaterally.  Nails Thick disfigured discolored nails with subungual debris  from hallux to fifth toes bilaterally. No evidence of bacterial infection or drainage bilaterally.  Orthopedic  No limitations of motion  feet .  No crepitus or effusions noted.  No bony pathology or digital deformities noted.  Skin  normotropic skin with no porokeratosis noted bilaterally.  No signs of infections or ulcers noted.     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  Consent was obtained for treatment procedures.   Mechanical debridement of nails 1-5  bilaterally performed with a nail nipper.  Filed with dremel without incident.     Return office visit     4  months                 Told patient to return for periodic foot care and evaluation due to potential at risk complications.   Helane Gunther DPM

## 2024-08-05 ENCOUNTER — Ambulatory Visit
Admission: RE | Admit: 2024-08-05 | Discharge: 2024-08-05 | Disposition: A | Discharge status: Home / Self Care | Source: Ambulatory Visit | Attending: Physician Assistant | Admitting: Physician Assistant

## 2024-08-05 ENCOUNTER — Telehealth: Payer: Self-pay | Admitting: Oncology

## 2024-08-05 ENCOUNTER — Inpatient Hospital Stay

## 2024-08-05 ENCOUNTER — Inpatient Hospital Stay: Admitting: Oncology

## 2024-08-05 DIAGNOSIS — M899 Disorder of bone, unspecified: Secondary | ICD-10-CM

## 2024-08-05 DIAGNOSIS — R9389 Abnormal findings on diagnostic imaging of other specified body structures: Secondary | ICD-10-CM | POA: Insufficient documentation

## 2024-08-05 DIAGNOSIS — R935 Abnormal findings on diagnostic imaging of other abdominal regions, including retroperitoneum: Secondary | ICD-10-CM | POA: Insufficient documentation

## 2024-08-05 DIAGNOSIS — M898X8 Other specified disorders of bone, other site: Secondary | ICD-10-CM | POA: Insufficient documentation

## 2024-08-05 MED ORDER — IOHEXOL 300 MG/ML  SOLN
75.0000 mL | Freq: Once | INTRAMUSCULAR | Status: AC | PRN
  Administered 2024-08-05: 75 mL via INTRAVENOUS

## 2024-08-05 NOTE — Telephone Encounter (Signed)
 Pt no showed new hem appt today 11/7. I called pt legal guardian and left vm to call back if they want to r/s appts. Scheduling phone number provided.

## 2024-08-08 ENCOUNTER — Encounter: Payer: Self-pay | Admitting: Oncology

## 2024-08-08 ENCOUNTER — Inpatient Hospital Stay: Attending: Oncology | Admitting: Oncology

## 2024-08-08 ENCOUNTER — Inpatient Hospital Stay

## 2024-08-08 VITALS — BP 98/52 | HR 66 | Temp 97.9°F | Resp 18 | Wt 164.6 lb

## 2024-08-08 DIAGNOSIS — F79 Unspecified intellectual disabilities: Secondary | ICD-10-CM | POA: Diagnosis not present

## 2024-08-08 DIAGNOSIS — Z808 Family history of malignant neoplasm of other organs or systems: Secondary | ICD-10-CM | POA: Diagnosis not present

## 2024-08-08 DIAGNOSIS — G809 Cerebral palsy, unspecified: Secondary | ICD-10-CM | POA: Diagnosis not present

## 2024-08-08 DIAGNOSIS — F809 Developmental disorder of speech and language, unspecified: Secondary | ICD-10-CM | POA: Diagnosis not present

## 2024-08-08 DIAGNOSIS — Z801 Family history of malignant neoplasm of trachea, bronchus and lung: Secondary | ICD-10-CM | POA: Diagnosis not present

## 2024-08-08 DIAGNOSIS — M899 Disorder of bone, unspecified: Secondary | ICD-10-CM

## 2024-08-08 DIAGNOSIS — Z79899 Other long term (current) drug therapy: Secondary | ICD-10-CM | POA: Diagnosis not present

## 2024-08-08 LAB — CBC WITH DIFFERENTIAL/PLATELET
Abs Immature Granulocytes: 0.02 K/uL (ref 0.00–0.07)
Basophils Absolute: 0 K/uL (ref 0.0–0.1)
Basophils Relative: 1 %
Eosinophils Absolute: 0.1 K/uL (ref 0.0–0.5)
Eosinophils Relative: 2 %
HCT: 42.1 % (ref 36.0–46.0)
Hemoglobin: 13.6 g/dL (ref 12.0–15.0)
Immature Granulocytes: 1 %
Lymphocytes Relative: 31 %
Lymphs Abs: 1.2 K/uL (ref 0.7–4.0)
MCH: 29.8 pg (ref 26.0–34.0)
MCHC: 32.3 g/dL (ref 30.0–36.0)
MCV: 92.3 fL (ref 80.0–100.0)
Monocytes Absolute: 0.4 K/uL (ref 0.1–1.0)
Monocytes Relative: 11 %
Neutro Abs: 2.1 K/uL (ref 1.7–7.7)
Neutrophils Relative %: 54 %
Platelets: 166 K/uL (ref 150–400)
RBC: 4.56 MIL/uL (ref 3.87–5.11)
RDW: 12.2 % (ref 11.5–15.5)
WBC: 3.8 K/uL — ABNORMAL LOW (ref 4.0–10.5)
nRBC: 0 % (ref 0.0–0.2)

## 2024-08-08 LAB — HEPATIC FUNCTION PANEL
ALT: 12 U/L (ref 0–44)
AST: 16 U/L (ref 15–41)
Albumin: 3.6 g/dL (ref 3.5–5.0)
Alkaline Phosphatase: 84 U/L (ref 38–126)
Bilirubin, Direct: 0.1 mg/dL (ref 0.0–0.2)
Indirect Bilirubin: 0.2 mg/dL — ABNORMAL LOW (ref 0.3–0.9)
Total Bilirubin: 0.3 mg/dL (ref 0.0–1.2)
Total Protein: 7.2 g/dL (ref 6.5–8.1)

## 2024-08-08 LAB — LACTATE DEHYDROGENASE: LDH: 161 U/L (ref 98–192)

## 2024-08-08 NOTE — Assessment & Plan Note (Addendum)
 Scattered sclerotic lesions.  Nonspecific.  This could be due to metastatic disease versus benign bone islands.  CT chest abdomen pelvis showed no primary malignancy No strong family history of cancer except mother with lung cancer which metastasis to brain. Check CBC multiple myeloma, light chain ratio, LFT, Consider MRI thoracic lumbar spine with and without contrast for further evaluation.  However patient may need sedation further imaging.
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
## 2024-08-09 LAB — KAPPA/LAMBDA LIGHT CHAINS
Kappa free light chain: 28.2 mg/L — ABNORMAL HIGH (ref 3.3–19.4)
Kappa, lambda light chain ratio: 1.2 (ref 0.26–1.65)
Lambda free light chains: 23.5 mg/L (ref 5.7–26.3)

## 2024-08-11 LAB — MULTIPLE MYELOMA PANEL, SERUM
Albumin SerPl Elph-Mcnc: 3.4 g/dL (ref 2.9–4.4)
Albumin/Glob SerPl: 1.1 (ref 0.7–1.7)
Alpha 1: 0.3 g/dL (ref 0.0–0.4)
Alpha2 Glob SerPl Elph-Mcnc: 0.9 g/dL (ref 0.4–1.0)
B-Globulin SerPl Elph-Mcnc: 1.1 g/dL (ref 0.7–1.3)
Gamma Glob SerPl Elph-Mcnc: 1.1 g/dL (ref 0.4–1.8)
Globulin, Total: 3.3 g/dL (ref 2.2–3.9)
IgA: 286 mg/dL (ref 87–352)
IgG (Immunoglobin G), Serum: 1159 mg/dL (ref 586–1602)
IgM (Immunoglobulin M), Srm: 71 mg/dL (ref 26–217)
Total Protein ELP: 6.7 g/dL (ref 6.0–8.5)

## 2024-09-12 ENCOUNTER — Encounter: Payer: Self-pay | Admitting: Oncology

## 2024-09-12 ENCOUNTER — Inpatient Hospital Stay: Attending: Oncology | Admitting: Oncology

## 2024-09-12 VITALS — BP 130/82 | HR 50 | Temp 98.6°F | Resp 20 | Wt 163.9 lb

## 2024-09-12 DIAGNOSIS — M899 Disorder of bone, unspecified: Secondary | ICD-10-CM

## 2024-09-12 DIAGNOSIS — Z801 Family history of malignant neoplasm of trachea, bronchus and lung: Secondary | ICD-10-CM | POA: Diagnosis not present

## 2024-09-12 DIAGNOSIS — Z79899 Other long term (current) drug therapy: Secondary | ICD-10-CM | POA: Diagnosis not present

## 2024-09-12 DIAGNOSIS — G809 Cerebral palsy, unspecified: Secondary | ICD-10-CM | POA: Insufficient documentation

## 2024-09-12 DIAGNOSIS — F79 Unspecified intellectual disabilities: Secondary | ICD-10-CM | POA: Insufficient documentation

## 2024-09-12 NOTE — Assessment & Plan Note (Addendum)
 Scattered sclerotic lesions.  Nonspecific.  This could be due to metastatic disease versus benign bone islands.  CT chest abdomen pelvis showed no primary malignancy No strong family history of cancer except mother with lung cancer which metastasis to brain. Myeloma panel showed no M protein. Normal light chain ratio.  Recommend  MRI cervical thoracic lumbar spine with and without contrast for further evaluation.

## 2024-09-12 NOTE — Assessment & Plan Note (Signed)
 Per POA, patient will not collaborate during MRI  patient will need sedation to complete exam. Sister agrees with the plan
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
## 2024-09-23 ENCOUNTER — Other Ambulatory Visit: Payer: Self-pay | Admitting: Neurology

## 2024-09-26 NOTE — Telephone Encounter (Signed)
 Request for refill. Last appt: 03/24/24 Next appt: 10/05/24

## 2024-09-26 NOTE — Telephone Encounter (Signed)
 Mandy from  [facility]  Called to follow up about Pt medication request  for refill  cloBAZam    Informed that MD received request  and is working on getting medication refill Pt medication is to be sent to   [place], [place] - [address] Phone: [phone]  Fax: [phone]

## 2024-09-27 ENCOUNTER — Other Ambulatory Visit: Payer: Self-pay | Admitting: Neurology

## 2024-09-27 NOTE — Telephone Encounter (Signed)
"   Mandy [facility] called stating they never received order for Pt medication cloBAZam  (ONFI ) 10 MG tablet  . They requested to speak to Nurse  . Due to when they requested  for medication today  is stated it was already taking care off and they have not received anything   Callback number is [phone] "

## 2024-09-28 ENCOUNTER — Other Ambulatory Visit: Payer: Self-pay | Admitting: Neurology

## 2024-09-28 NOTE — Telephone Encounter (Signed)
 Requested Prescriptions   Pending Prescriptions Disp Refills   cloBAZam  (ONFI ) 10 MG tablet [Pharmacy Med Name: CLOBAZAM  10 MG TAB] 30 tablet     Sig: TAKE 1 TABLET BY MOUTH AT BEDTIME   Last seen 03/24/24 Next appt 10/05/24  Dispenses   Dispensed Days Supply Quantity Provider Pharmacy  CLOBAZAM      TAB 10MG  08/27/2024 30 30 each Camara, Amadou, MD [facility],...  CLOBAZAM      TAB 10MG  07/25/2024 30 30 each Camara, Amadou, MD [facility] [place],...  CLOBAZAM      TAB 10MG  06/21/2024 30 30 each Camara, Amadou, MD [facility] [place],...  CLOBAZAM      TAB 10MG  05/20/2024 30 30 each Camara, Amadou, MD [facility] [place],...  CLOBAZAM      TAB 10MG  04/22/2024 30 30 each Camara, Amadou, MD [facility] [place],...  CLOBAZAM      TAB 10MG  03/21/2024 30 30 each Camara, Amadou, MD [facility] [place],...  CLOBAZAM      TAB 10MG  02/25/2024 30 30 each Camara, Amadou, MD [facility] [place],...  CLOBAZAM      TAB 10MG  01/22/2024 30 30 each Camara, Amadou, MD [facility] [place],...  CLOBAZAM      TAB 10MG  11/09/2023 30 30 each Camara, Amadou, MD [facility] [place],...

## 2024-10-03 ENCOUNTER — Other Ambulatory Visit: Payer: Self-pay

## 2024-10-03 ENCOUNTER — Encounter (HOSPITAL_COMMUNITY): Payer: Self-pay

## 2024-10-03 ENCOUNTER — Other Ambulatory Visit: Payer: Self-pay | Admitting: Neurology

## 2024-10-03 MED ORDER — CLOBAZAM 10 MG PO TABS: 10.0000 mg | ORAL_TABLET | Freq: Every day | ORAL | 5 refills | Status: AC

## 2024-10-03 NOTE — Progress Notes (Signed)
 SDW call  Patient's sister, [name] was given pre-op instructions over the phone. She verbalized understanding of instructions provided.  She denied patient having any SOB, fever or cough   PCP - Dr. Maxie Buckles Cardiologist -  Pulmonary:    PPM/ICD - denies Device Orders - na Rep Notified - na   Chest x-ray - 07/15/2024 EKG -  DOS, 10/06/2024 Stress Test - ECHO -  Cardiac Cath -   Sleep Study/sleep apnea/CPAP: denies  Type II diabetic. A1C 5.2 on 07/12/2024. Does not check her blood sugar and takes no medication for diabetes Fasting Blood sugar range:na How often check sugars:na   Blood Thinner Instructions: denies Aspirin Instructions:denies   ERAS Protcol - Clears until 0700   Anesthesia review: Yes. HTN, DM, cerebral palsy, seizures  Your procedure is scheduled on Thursday October 06, 2024  Report to [facility] Main Entrance A at  0730  A.M., then check in with the Admitting office.  Call this number if you have problems the morning of surgery:  [phone]   If you have any questions prior to your surgery date call [phone]: Open Monday-Friday 8am-4pm If you experience any cold or flu symptoms such as cough, fever, chills, shortness of breath, etc. between now and your scheduled surgery, please notify us  at the above number     Remember:  Do not eat after midnight the night before your surgery  You may drink clear liquids until 0700    the morning of your surgery.   Clear liquids allowed are: Water, Non-Citrus Juices (without pulp), Carbonated Beverages, Clear Tea, Black Coffee ONLY (NO MILK, CREAM OR POWDERED CREAMER of any kind), and Gatorade   Take these medicines the morning of surgery with A SIP OF WATER:  Zyrtec, flonase, keppra , metoprolol , zonegran   As needed: diazepam   As of today, STOP taking any Aspirin (unless otherwise instructed by your surgeon) Aleve, Naproxen, Ibuprofen, Motrin, Advil, Goody's, BC's, all herbal medications, fish oil, and  all vitamins.

## 2024-10-03 NOTE — Anesthesia Preprocedure Evaluation (Addendum)
"                                    Anesthesia Evaluation  Patient identified by MRN, date of birth, ID band Patient awake  General Assessment Comment:  Patient awake alert, non verbal, follows some commands.  Reviewed: Allergy & Precautions, H&P , NPO status , Patient's Chart, lab work & pertinent test results, Unable to perform ROS - Chart review only  History of Anesthesia Complications Negative for: history of anesthetic complications  Airway Mallampati: III  TM Distance: <3 FB Neck ROM: limited    Dental  (+) Edentulous Upper, Edentulous Lower   Pulmonary neg shortness of breath, asthma    Pulmonary exam normal breath sounds clear to auscultation       Cardiovascular Exercise Tolerance: Poor hypertension, Pt. on medications (-) angina (-) Past MI + dysrhythmias  Rhythm:Regular Rate:Normal - Systolic murmurs    Neuro/Psych Seizures -, Poorly Controlled,  PSYCHIATRIC DISORDERS      Follows with neurology regularly. At last appointment, recommended some medication changes, and routine 6 month followup  negative psych ROS   GI/Hepatic negative GI ROS, Neg liver ROS,neg GERD  ,,  Endo/Other  diabetes, Type 2    Renal/GU      Musculoskeletal   Abdominal   Peds  Hematology negative hematology ROS (+)   Anesthesia Other Findings Per PAT note: 54 year old female with pertinent history including cerebral palsy with right hemiparesis, intellectual disability, diet-controlled DM2 (A1c 5.21 07/12/2024), HTN, HLD, intractable nocturnal seizures.  Patient's legal guardian is her sister, Zema Lizardo.   Last seen in neurology follow-up by Dr. Gregg on 03/24/2024 and noted to no seizure activity since starting clobazam  5 mg nightly in 07/2023.  Patient's sister reported she did have breakthrough seizures requiring Valtoco  administration after running out of clobazam .  She was instructed to restart clobazam  5 mg nightly, continue levetiracetam  1000 mg twice daily,  decrease zonisamide  to 200 mg twice daily.   Reproductive/Obstetrics negative OB ROS                              Anesthesia Physical Anesthesia Plan  ASA: 3  Anesthesia Plan: General   Post-op Pain Management: Minimal or no pain anticipated   Induction: Intravenous  PONV Risk Score and Plan: 2 and Ondansetron  and Dexamethasone   Airway Management Planned: Oral ETT  Additional Equipment: None  Intra-op Plan:   Post-operative Plan: Extubation in OR  Informed Consent: I have reviewed the patients History and Physical, chart, labs and discussed the procedure including the risks, benefits and alternatives for the proposed anesthesia with the patient or authorized representative who has indicated his/her understanding and acceptance.     Dental advisory given  Plan Discussed with: CRNA and Surgeon  Anesthesia Plan Comments: (Discussed risks of anesthesia with patient, including PONV, sore throat, lip/dental/eye damage. Rare risks discussed as well, such as cardiorespiratory and neurological sequelae, and allergic reactions. Discussed the role of CRNA in patient's perioperative care. Patient understands.)         Anesthesia Quick Evaluation  "

## 2024-10-03 NOTE — Telephone Encounter (Signed)
 SABRA

## 2024-10-03 NOTE — Progress Notes (Signed)
 Anesthesia Chart Review: Same day workup  54 year old female with pertinent history including cerebral palsy with right hemiparesis, intellectual disability, diet-controlled DM2 (A1c 5.21 07/12/2024), HTN, HLD, intractable nocturnal seizures.  Patient's legal guardian is her sister, Sandra Osborne.  Last seen in neurology follow-up by Dr. Gregg on 03/24/2024 and noted to no seizure activity since starting clobazam  5 mg nightly in 07/2023.  Patient's sister reported she did have breakthrough seizures requiring Valtoco  administration after running out of clobazam .  She was instructed to restart clobazam  5 mg nightly, continue levetiracetam  1000 mg twice daily, decrease zonisamide  to 200 mg twice daily.  Patient has previously tolerated general anesthesia without complication.  Most recently she had laparoscopic cholecystectomy 08/08/2019 with no complications noted.  She will need day of surgery labs and evaluation.    Sandra Osborne [facility]/Anesthesiology Phone [phone] 10/03/2024 11:45 AM

## 2024-10-05 ENCOUNTER — Ambulatory Visit: Admitting: Neurology

## 2024-10-05 ENCOUNTER — Telehealth: Payer: Self-pay | Admitting: Neurology

## 2024-10-05 NOTE — Telephone Encounter (Signed)
 Daughter reports pt can not make appointment today, will call back to r/s.

## 2024-10-06 ENCOUNTER — Ambulatory Visit (HOSPITAL_COMMUNITY)

## 2024-10-06 ENCOUNTER — Encounter (HOSPITAL_COMMUNITY): Payer: Self-pay | Admitting: Oncology

## 2024-10-06 ENCOUNTER — Ambulatory Visit (HOSPITAL_COMMUNITY): Payer: Self-pay | Admitting: Physician Assistant

## 2024-10-06 ENCOUNTER — Other Ambulatory Visit: Payer: Self-pay

## 2024-10-06 ENCOUNTER — Encounter (HOSPITAL_COMMUNITY)
Admission: AD | Disposition: A | Discharge status: Home / Self Care | Payer: Self-pay | Source: Ambulatory Visit | Attending: Oncology

## 2024-10-06 ENCOUNTER — Ambulatory Visit (HOSPITAL_COMMUNITY)
Admission: AD | Admit: 2024-10-06 | Discharge: 2024-10-06 | Disposition: A | Discharge status: Home / Self Care | Source: Ambulatory Visit | Attending: Oncology | Admitting: Oncology

## 2024-10-06 DIAGNOSIS — I10 Essential (primary) hypertension: Secondary | ICD-10-CM | POA: Insufficient documentation

## 2024-10-06 DIAGNOSIS — M899 Disorder of bone, unspecified: Secondary | ICD-10-CM | POA: Insufficient documentation

## 2024-10-06 DIAGNOSIS — M5134 Other intervertebral disc degeneration, thoracic region: Secondary | ICD-10-CM

## 2024-10-06 DIAGNOSIS — I499 Cardiac arrhythmia, unspecified: Secondary | ICD-10-CM | POA: Diagnosis not present

## 2024-10-06 DIAGNOSIS — G808 Other cerebral palsy: Secondary | ICD-10-CM | POA: Insufficient documentation

## 2024-10-06 DIAGNOSIS — Z9049 Acquired absence of other specified parts of digestive tract: Secondary | ICD-10-CM | POA: Diagnosis not present

## 2024-10-06 DIAGNOSIS — E119 Type 2 diabetes mellitus without complications: Secondary | ICD-10-CM | POA: Insufficient documentation

## 2024-10-06 DIAGNOSIS — G4089 Other seizures: Secondary | ICD-10-CM | POA: Diagnosis not present

## 2024-10-06 DIAGNOSIS — E785 Hyperlipidemia, unspecified: Secondary | ICD-10-CM | POA: Insufficient documentation

## 2024-10-06 DIAGNOSIS — F79 Unspecified intellectual disabilities: Secondary | ICD-10-CM | POA: Insufficient documentation

## 2024-10-06 DIAGNOSIS — Z79899 Other long term (current) drug therapy: Secondary | ICD-10-CM | POA: Insufficient documentation

## 2024-10-06 HISTORY — PX: RADIOLOGY WITH ANESTHESIA: SHX6223

## 2024-10-06 LAB — POCT I-STAT, CHEM 8
BUN: 15 mg/dL (ref 6–20)
Calcium, Ion: 1.08 mmol/L — ABNORMAL LOW (ref 1.15–1.40)
Chloride: 115 mmol/L — ABNORMAL HIGH (ref 98–111)
Creatinine, Ser: 0.8 mg/dL (ref 0.44–1.00)
Glucose, Bld: 101 mg/dL — ABNORMAL HIGH (ref 70–99)
HCT: 40 % (ref 36.0–46.0)
Hemoglobin: 13.6 g/dL (ref 12.0–15.0)
Potassium: 4.1 mmol/L (ref 3.5–5.1)
Sodium: 143 mmol/L (ref 135–145)
TCO2: 17 mmol/L — ABNORMAL LOW (ref 22–32)

## 2024-10-06 LAB — GLUCOSE, CAPILLARY
Glucose-Capillary: 89 mg/dL (ref 70–99)
Glucose-Capillary: 94 mg/dL (ref 70–99)

## 2024-10-06 MED ORDER — MIDAZOLAM HCL 5 MG/5ML IJ SOLN
INTRAMUSCULAR | Status: DC | PRN
  Administered 2024-10-06: 2 mg via INTRAVENOUS

## 2024-10-06 MED ORDER — PROPOFOL 10 MG/ML IV BOLUS
INTRAVENOUS | Status: DC | PRN
  Administered 2024-10-06: 150 mg via INTRAVENOUS

## 2024-10-06 MED ORDER — ONDANSETRON HCL 4 MG/2ML IJ SOLN
INTRAMUSCULAR | Status: DC | PRN
  Administered 2024-10-06: 4 mg via INTRAVENOUS

## 2024-10-06 MED ORDER — PHENYLEPHRINE HCL-NACL 20-0.9 MG/250ML-% IV SOLN
INTRAVENOUS | Status: DC | PRN
  Administered 2024-10-06: 20 ug/min via INTRAVENOUS

## 2024-10-06 MED ORDER — GLYCOPYRROLATE 0.2 MG/ML IJ SOLN
INTRAMUSCULAR | Status: DC | PRN
  Administered 2024-10-06: .2 mg via INTRAVENOUS

## 2024-10-06 MED ORDER — MIDAZOLAM HCL 2 MG/2ML IJ SOLN
INTRAMUSCULAR | Status: AC
  Filled 2024-10-06: qty 2

## 2024-10-06 MED ORDER — GADOBUTROL 1 MMOL/ML IV SOLN
7.0000 mL | Freq: Once | INTRAVENOUS | Status: AC | PRN
  Administered 2024-10-06: 7 mL via INTRAVENOUS

## 2024-10-06 MED ORDER — LACTATED RINGERS IV SOLN: INTRAVENOUS | Status: DC

## 2024-10-06 MED ORDER — EPHEDRINE SULFATE-NACL 50-0.9 MG/10ML-% IV SOSY
PREFILLED_SYRINGE | INTRAVENOUS | Status: DC | PRN
  Administered 2024-10-06: 10 mg via INTRAVENOUS

## 2024-10-06 MED ORDER — CHLORHEXIDINE GLUCONATE 0.12 % MT SOLN: 15.0000 mL | Freq: Once | OROMUCOSAL | Status: DC

## 2024-10-06 MED ORDER — LIDOCAINE 2% (20 MG/ML) 5 ML SYRINGE
INTRAMUSCULAR | Status: DC | PRN
  Administered 2024-10-06: 70 mg via INTRAVENOUS

## 2024-10-06 MED ORDER — ORAL CARE MOUTH RINSE: 15.0000 mL | Freq: Once | OROMUCOSAL | Status: DC

## 2024-10-06 NOTE — Anesthesia Postprocedure Evaluation (Signed)
"   Anesthesia Post Note  Patient: Sandra Osborne  Procedure(s) Performed: RADIOLOGY WITH ANESTHESIA     Patient location during evaluation: PACU Anesthesia Type: General Level of consciousness: awake and alert Pain management: pain level controlled Vital Signs Assessment: post-procedure vital signs reviewed and stable Respiratory status: spontaneous breathing, nonlabored ventilation, respiratory function stable and patient connected to nasal cannula oxygen Cardiovascular status: blood pressure returned to baseline and stable Postop Assessment: no apparent nausea or vomiting Anesthetic complications: no   No notable events documented.  Last Vitals:  Vitals:   10/06/24 1115 10/06/24 1118  BP: 125/78   Pulse: 71   Resp: 14 20  Temp:    SpO2: 100%     Last Pain:  Vitals:   10/06/24 1115  TempSrc:   PainSc: 0-No pain                 [name]      "

## 2024-10-06 NOTE — Anesthesia Procedure Notes (Signed)
 Procedure Name: LMA Insertion Date/Time: 10/06/2024 9:51 AM  Performed by: Jerl Donald LABOR, CRNAPre-anesthesia Checklist: Patient identified, Emergency Drugs available, Suction available and Patient being monitored Patient Re-evaluated:Patient Re-evaluated prior to induction Oxygen Delivery Method: Circle System Utilized Preoxygenation: Pre-oxygenation with 100% oxygen Induction Type: IV induction Ventilation: Mask ventilation without difficulty LMA: LMA inserted LMA Size: 4.0 Number of attempts: 1 Placement Confirmation: positive ETCO2 Tube secured with: Tape Dental Injury: Teeth and Oropharynx as per pre-operative assessment

## 2024-10-06 NOTE — Transfer of Care (Signed)
 Immediate Anesthesia Transfer of Care Note  Patient: Sandra Osborne  Procedure(s) Performed: RADIOLOGY WITH ANESTHESIA  Patient Location: PACU  Anesthesia Type:General  Level of Consciousness: drowsy  Airway & Oxygen Therapy: Patient Spontanous Breathing  Post-op Assessment: Report given to RN, Post -op Vital signs reviewed and stable, and Patient moving all extremities  Post vital signs: Reviewed and stable  Last Vitals:  Vitals Value Taken Time  BP 129/82 10/06/24 11:03  Temp    Pulse 82 10/06/24 11:07  Resp 13 10/06/24 11:07  SpO2 100 % 10/06/24 11:07  Vitals shown include unfiled device data.  Last Pain:  Vitals:   10/06/24 0747  TempSrc: Oral         Complications: No notable events documented.

## 2024-10-07 ENCOUNTER — Encounter (HOSPITAL_COMMUNITY): Payer: Self-pay | Admitting: Radiology

## 2024-10-08 ENCOUNTER — Ambulatory Visit: Payer: Self-pay | Admitting: Oncology

## 2024-10-10 NOTE — Progress Notes (Signed)
 Called number provided in chart x2 and no answer. Detailed message left. Will wait to hear from pt/ caregiver to see if they want to proceed with scan.

## 2024-10-11 ENCOUNTER — Other Ambulatory Visit: Payer: Self-pay

## 2024-10-11 DIAGNOSIS — M899 Disorder of bone, unspecified: Secondary | ICD-10-CM

## 2024-10-11 NOTE — Progress Notes (Signed)
 Spoke to pt's caregiver [name] and informed her of MD recommendation for follow up with scan in 6 months. [name] is ok with appts being scheduled and follow-up app with Dr. Babara.   Please schedule and notify [name]:   MRI Thoracic (with sedation) in 6 months  MD 1 week after MRI

## 2024-11-01 ENCOUNTER — Ambulatory Visit: Admitting: Podiatry

## 2024-11-22 ENCOUNTER — Ambulatory Visit: Admitting: Podiatry
